# Patient Record
Sex: Male | Born: 1970 | Race: White | Hispanic: No | Marital: Single | Smoking: Former smoker
Health system: Southern US, Community
[De-identification: ages and names within clinical notes are randomized; demographics above are authoritative.]

## PROBLEM LIST (undated history)

## (undated) DIAGNOSIS — F912 Conduct disorder, adolescent-onset type: Secondary | ICD-10-CM

## (undated) HISTORY — PX: ABDOMINAL SURGERY: SHX537

---

## 2002-02-02 ENCOUNTER — Encounter: Payer: Self-pay | Admitting: Orthopedic Surgery

## 2002-02-02 ENCOUNTER — Inpatient Hospital Stay (HOSPITAL_COMMUNITY): Admission: EM | Admit: 2002-02-02 | Discharge: 2002-02-04 | Payer: Self-pay | Admitting: Emergency Medicine

## 2012-05-12 ENCOUNTER — Emergency Department (HOSPITAL_COMMUNITY): Payer: No Typology Code available for payment source

## 2012-05-12 ENCOUNTER — Emergency Department (HOSPITAL_COMMUNITY)
Admission: EM | Admit: 2012-05-12 | Discharge: 2012-05-12 | Disposition: A | Discharge status: Home / Self Care | Payer: No Typology Code available for payment source | Attending: Emergency Medicine | Admitting: Emergency Medicine

## 2012-05-12 DIAGNOSIS — S8000XA Contusion of unspecified knee, initial encounter: Secondary | ICD-10-CM | POA: Insufficient documentation

## 2012-05-12 DIAGNOSIS — R51 Headache: Secondary | ICD-10-CM | POA: Insufficient documentation

## 2012-05-12 DIAGNOSIS — Y92410 Unspecified street and highway as the place of occurrence of the external cause: Secondary | ICD-10-CM | POA: Insufficient documentation

## 2012-05-12 DIAGNOSIS — M542 Cervicalgia: Secondary | ICD-10-CM | POA: Insufficient documentation

## 2012-05-12 MED ORDER — IBUPROFEN 800 MG PO TABS: 800.0000 mg | ORAL_TABLET | Freq: Three times a day (TID) | ORAL | Status: DC

## 2012-05-12 MED ORDER — IBUPROFEN 800 MG PO TABS
800.0000 mg | ORAL_TABLET | Freq: Once | ORAL | Status: AC
  Administered 2012-05-12: 800 mg via ORAL
  Filled 2012-05-12: qty 1
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
## 2012-05-12 NOTE — ED Notes (Signed)
Staff ambulated with pt around the nurses station, pt sts no dizziness, "just a little stiff in the neck area."
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
## 2012-05-12 NOTE — ED Notes (Signed)
Patient has been removed from back board.  He only reports pain in his head and neck. He is sitting up in bed with C Collar in place with only complaint of a head ache

## 2015-04-01 ENCOUNTER — Encounter (HOSPITAL_COMMUNITY): Payer: Self-pay

## 2015-04-01 ENCOUNTER — Emergency Department (HOSPITAL_COMMUNITY): Payer: No Typology Code available for payment source

## 2015-04-01 ENCOUNTER — Emergency Department (HOSPITAL_COMMUNITY)
Admission: EM | Admit: 2015-04-01 | Discharge: 2015-04-02 | Disposition: A | Discharge status: Home / Self Care | Payer: No Typology Code available for payment source | Attending: Emergency Medicine | Admitting: Emergency Medicine

## 2015-04-01 DIAGNOSIS — S4991XA Unspecified injury of right shoulder and upper arm, initial encounter: Secondary | ICD-10-CM | POA: Insufficient documentation

## 2015-04-01 DIAGNOSIS — Z8659 Personal history of other mental and behavioral disorders: Secondary | ICD-10-CM | POA: Insufficient documentation

## 2015-04-01 DIAGNOSIS — Y92410 Unspecified street and highway as the place of occurrence of the external cause: Secondary | ICD-10-CM | POA: Diagnosis not present

## 2015-04-01 DIAGNOSIS — R109 Unspecified abdominal pain: Secondary | ICD-10-CM

## 2015-04-01 DIAGNOSIS — S3991XA Unspecified injury of abdomen, initial encounter: Secondary | ICD-10-CM | POA: Insufficient documentation

## 2015-04-01 DIAGNOSIS — Y998 Other external cause status: Secondary | ICD-10-CM | POA: Insufficient documentation

## 2015-04-01 DIAGNOSIS — Z791 Long term (current) use of non-steroidal anti-inflammatories (NSAID): Secondary | ICD-10-CM | POA: Diagnosis not present

## 2015-04-01 DIAGNOSIS — Y9389 Activity, other specified: Secondary | ICD-10-CM | POA: Diagnosis not present

## 2015-04-01 DIAGNOSIS — S0990XA Unspecified injury of head, initial encounter: Secondary | ICD-10-CM | POA: Diagnosis not present

## 2015-04-01 HISTORY — DX: Conduct disorder, adolescent-onset type: F91.2

## 2015-04-01 MED ORDER — IOHEXOL 300 MG/ML  SOLN: 100.0000 mL | Freq: Once | INTRAMUSCULAR | Status: AC | PRN

## 2015-04-01 MED ORDER — SODIUM CHLORIDE 0.9 % IV BOLUS (SEPSIS)
1000.0000 mL | Freq: Once | INTRAVENOUS | Status: AC
  Administered 2015-04-02: 1000 mL via INTRAVENOUS
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
## 2015-04-02 ENCOUNTER — Emergency Department (HOSPITAL_COMMUNITY): Payer: No Typology Code available for payment source

## 2015-04-02 LAB — CBC
HEMATOCRIT: 42.4 % (ref 39.0–52.0)
HEMOGLOBIN: 15 g/dL (ref 13.0–17.0)
MCH: 32.5 pg (ref 26.0–34.0)
MCHC: 35.4 g/dL (ref 30.0–36.0)
MCV: 91.8 fL (ref 78.0–100.0)
PLATELETS: 201 10*3/uL (ref 150–400)
RBC: 4.62 MIL/uL (ref 4.22–5.81)
RDW: 12.8 % (ref 11.5–15.5)
WBC: 10.3 10*3/uL (ref 4.0–10.5)

## 2015-04-02 LAB — BASIC METABOLIC PANEL
ANION GAP: 12 (ref 5–15)
BUN: 8 mg/dL (ref 6–20)
CALCIUM: 8.7 mg/dL — AB (ref 8.9–10.3)
CO2: 24 mmol/L (ref 22–32)
Chloride: 104 mmol/L (ref 101–111)
Creatinine, Ser: 1.09 mg/dL (ref 0.61–1.24)
GFR calc non Af Amer: >60 mL/min (ref >60)
GLUCOSE: 93 mg/dL (ref 65–99)
Potassium: 3.6 mmol/L (ref 3.5–5.1)
SODIUM: 140 mmol/L (ref 135–145)

## 2015-04-02 LAB — ETHANOL: ALCOHOL ETHYL (B): 176 mg/dL — AB (ref <5)

## 2015-04-02 MED ORDER — IOHEXOL 300 MG/ML  SOLN
75.0000 mL | Freq: Once | INTRAMUSCULAR | Status: AC | PRN
  Administered 2015-04-02: 75 mL via INTRAVENOUS

## 2015-04-02 NOTE — ED Notes (Signed)
Pt verbalizes understanding of d/c instructions and denies any further needs at this time. 
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
## 2015-04-02 NOTE — ED Notes (Signed)
Pt returned from CT °

## 2015-04-02 NOTE — ED Notes (Signed)
Pt returned from xray

## 2015-04-02 NOTE — ED Notes (Signed)
Patient transported to X-ray and CT 

## 2020-08-09 ENCOUNTER — Inpatient Hospital Stay (HOSPITAL_COMMUNITY): Payer: Self-pay

## 2020-08-09 ENCOUNTER — Emergency Department (HOSPITAL_COMMUNITY): Payer: Self-pay

## 2020-08-09 ENCOUNTER — Inpatient Hospital Stay (HOSPITAL_COMMUNITY)
Admission: EM | Admit: 2020-08-09 | Discharge: 2020-09-25 | DRG: 003 | Disposition: E | Payer: Self-pay | Attending: Pulmonary Disease | Admitting: Pulmonary Disease

## 2020-08-09 DIAGNOSIS — J9690 Respiratory failure, unspecified, unspecified whether with hypoxia or hypercapnia: Principal | ICD-10-CM

## 2020-08-09 DIAGNOSIS — R069 Unspecified abnormalities of breathing: Secondary | ICD-10-CM

## 2020-08-09 DIAGNOSIS — Z978 Presence of other specified devices: Secondary | ICD-10-CM

## 2020-08-09 DIAGNOSIS — N179 Acute kidney failure, unspecified: Secondary | ICD-10-CM

## 2020-08-09 DIAGNOSIS — Z4659 Encounter for fitting and adjustment of other gastrointestinal appliance and device: Secondary | ICD-10-CM

## 2020-08-09 DIAGNOSIS — I469 Cardiac arrest, cause unspecified: Secondary | ICD-10-CM | POA: Diagnosis present

## 2020-08-09 DIAGNOSIS — Z452 Encounter for adjustment and management of vascular access device: Secondary | ICD-10-CM

## 2020-08-09 DIAGNOSIS — J9600 Acute respiratory failure, unspecified whether with hypoxia or hypercapnia: Secondary | ICD-10-CM

## 2020-08-09 DIAGNOSIS — J9621 Acute and chronic respiratory failure with hypoxia: Secondary | ICD-10-CM

## 2020-08-09 DIAGNOSIS — Z0189 Encounter for other specified special examinations: Secondary | ICD-10-CM

## 2020-08-09 DIAGNOSIS — J9601 Acute respiratory failure with hypoxia: Secondary | ICD-10-CM

## 2020-08-09 DIAGNOSIS — R0902 Hypoxemia: Secondary | ICD-10-CM

## 2020-08-09 DIAGNOSIS — J80 Acute respiratory distress syndrome: Secondary | ICD-10-CM

## 2020-08-09 DIAGNOSIS — J9622 Acute and chronic respiratory failure with hypercapnia: Secondary | ICD-10-CM

## 2020-08-09 DIAGNOSIS — Y832 Surgical operation with anastomosis, bypass or graft as the cause of abnormal reaction of the patient, or of later complication, without mention of misadventure at the time of the procedure: Secondary | ICD-10-CM | POA: Diagnosis not present

## 2020-08-09 DIAGNOSIS — G935 Compression of brain: Secondary | ICD-10-CM | POA: Diagnosis not present

## 2020-08-09 DIAGNOSIS — Y990 Civilian activity done for income or pay: Secondary | ICD-10-CM

## 2020-08-09 DIAGNOSIS — I468 Cardiac arrest due to other underlying condition: Secondary | ICD-10-CM | POA: Diagnosis present

## 2020-08-09 DIAGNOSIS — R7303 Prediabetes: Secondary | ICD-10-CM | POA: Diagnosis present

## 2020-08-09 DIAGNOSIS — I609 Nontraumatic subarachnoid hemorrhage, unspecified: Secondary | ICD-10-CM | POA: Diagnosis not present

## 2020-08-09 DIAGNOSIS — I472 Ventricular tachycardia: Secondary | ICD-10-CM | POA: Diagnosis not present

## 2020-08-09 DIAGNOSIS — E781 Pure hyperglyceridemia: Secondary | ICD-10-CM | POA: Diagnosis present

## 2020-08-09 DIAGNOSIS — E872 Acidosis: Secondary | ICD-10-CM | POA: Diagnosis present

## 2020-08-09 DIAGNOSIS — I2129 ST elevation (STEMI) myocardial infarction involving other sites: Secondary | ICD-10-CM | POA: Diagnosis not present

## 2020-08-09 DIAGNOSIS — N170 Acute kidney failure with tubular necrosis: Secondary | ICD-10-CM | POA: Diagnosis present

## 2020-08-09 DIAGNOSIS — I615 Nontraumatic intracerebral hemorrhage, intraventricular: Secondary | ICD-10-CM | POA: Diagnosis not present

## 2020-08-09 DIAGNOSIS — R579 Shock, unspecified: Secondary | ICD-10-CM | POA: Insufficient documentation

## 2020-08-09 DIAGNOSIS — R739 Hyperglycemia, unspecified: Secondary | ICD-10-CM | POA: Diagnosis present

## 2020-08-09 DIAGNOSIS — Z66 Do not resuscitate: Secondary | ICD-10-CM | POA: Diagnosis not present

## 2020-08-09 DIAGNOSIS — I129 Hypertensive chronic kidney disease with stage 1 through stage 4 chronic kidney disease, or unspecified chronic kidney disease: Secondary | ICD-10-CM | POA: Diagnosis present

## 2020-08-09 DIAGNOSIS — J690 Pneumonitis due to inhalation of food and vomit: Secondary | ICD-10-CM | POA: Diagnosis present

## 2020-08-09 DIAGNOSIS — Z515 Encounter for palliative care: Secondary | ICD-10-CM

## 2020-08-09 DIAGNOSIS — A419 Sepsis, unspecified organism: Secondary | ICD-10-CM | POA: Diagnosis not present

## 2020-08-09 DIAGNOSIS — Y848 Other medical procedures as the cause of abnormal reaction of the patient, or of later complication, without mention of misadventure at the time of the procedure: Secondary | ICD-10-CM | POA: Diagnosis not present

## 2020-08-09 DIAGNOSIS — N189 Chronic kidney disease, unspecified: Secondary | ICD-10-CM | POA: Diagnosis present

## 2020-08-09 DIAGNOSIS — D6959 Other secondary thrombocytopenia: Secondary | ICD-10-CM | POA: Diagnosis present

## 2020-08-09 DIAGNOSIS — G931 Anoxic brain damage, not elsewhere classified: Secondary | ICD-10-CM | POA: Diagnosis present

## 2020-08-09 DIAGNOSIS — R34 Anuria and oliguria: Secondary | ICD-10-CM | POA: Diagnosis present

## 2020-08-09 DIAGNOSIS — I4510 Unspecified right bundle-branch block: Secondary | ICD-10-CM | POA: Diagnosis not present

## 2020-08-09 DIAGNOSIS — G253 Myoclonus: Secondary | ICD-10-CM | POA: Diagnosis present

## 2020-08-09 DIAGNOSIS — R578 Other shock: Secondary | ICD-10-CM | POA: Diagnosis not present

## 2020-08-09 DIAGNOSIS — E876 Hypokalemia: Secondary | ICD-10-CM | POA: Diagnosis not present

## 2020-08-09 DIAGNOSIS — T508X5A Adverse effect of diagnostic agents, initial encounter: Secondary | ICD-10-CM | POA: Diagnosis not present

## 2020-08-09 DIAGNOSIS — X500XXA Overexertion from strenuous movement or load, initial encounter: Secondary | ICD-10-CM

## 2020-08-09 DIAGNOSIS — E875 Hyperkalemia: Secondary | ICD-10-CM | POA: Diagnosis present

## 2020-08-09 DIAGNOSIS — G9382 Brain death: Secondary | ICD-10-CM | POA: Diagnosis not present

## 2020-08-09 DIAGNOSIS — Z6841 Body Mass Index (BMI) 40.0 and over, adult: Secondary | ICD-10-CM

## 2020-08-09 DIAGNOSIS — G9341 Metabolic encephalopathy: Secondary | ICD-10-CM | POA: Diagnosis present

## 2020-08-09 DIAGNOSIS — K219 Gastro-esophageal reflux disease without esophagitis: Secondary | ICD-10-CM | POA: Diagnosis present

## 2020-08-09 DIAGNOSIS — Z20822 Contact with and (suspected) exposure to covid-19: Secondary | ICD-10-CM | POA: Diagnosis present

## 2020-08-09 DIAGNOSIS — I4891 Unspecified atrial fibrillation: Secondary | ICD-10-CM | POA: Diagnosis present

## 2020-08-09 DIAGNOSIS — D6489 Other specified anemias: Secondary | ICD-10-CM | POA: Diagnosis present

## 2020-08-09 DIAGNOSIS — J9501 Hemorrhage from tracheostomy stoma: Secondary | ICD-10-CM | POA: Diagnosis not present

## 2020-08-09 DIAGNOSIS — Z87891 Personal history of nicotine dependence: Secondary | ICD-10-CM

## 2020-08-09 DIAGNOSIS — F4310 Post-traumatic stress disorder, unspecified: Secondary | ICD-10-CM | POA: Diagnosis present

## 2020-08-09 DIAGNOSIS — N141 Nephropathy induced by other drugs, medicaments and biological substances: Secondary | ICD-10-CM | POA: Diagnosis not present

## 2020-08-09 DIAGNOSIS — T82524A Displacement of infusion catheter, initial encounter: Secondary | ICD-10-CM | POA: Diagnosis not present

## 2020-08-09 LAB — URINALYSIS, ROUTINE W REFLEX MICROSCOPIC
Bilirubin Urine: NEGATIVE
Glucose, UA: NEGATIVE mg/dL
Ketones, ur: NEGATIVE mg/dL
Nitrite: NEGATIVE
Protein, ur: 30 mg/dL — AB
Specific Gravity, Urine: 1.014 (ref 1.005–1.030)
pH: 5 (ref 5.0–8.0)

## 2020-08-09 LAB — I-STAT CHEM 8, ED
BUN: 16 mg/dL (ref 6–20)
Calcium, Ion: 1.1 mmol/L — ABNORMAL LOW (ref 1.15–1.40)
Chloride: 104 mmol/L (ref 98–111)
Creatinine, Ser: 1.8 mg/dL — ABNORMAL HIGH (ref 0.61–1.24)
Glucose, Bld: 299 mg/dL — ABNORMAL HIGH (ref 70–99)
HCT: 49 % (ref 39.0–52.0)
Hemoglobin: 16.7 g/dL (ref 13.0–17.0)
Potassium: 4.1 mmol/L (ref 3.5–5.1)
Sodium: 139 mmol/L (ref 135–145)
TCO2: 24 mmol/L (ref 22–32)

## 2020-08-09 LAB — COMPREHENSIVE METABOLIC PANEL
ALT: 55 U/L — ABNORMAL HIGH (ref 0–44)
AST: 104 U/L — ABNORMAL HIGH (ref 15–41)
Albumin: 3.2 g/dL — ABNORMAL LOW (ref 3.5–5.0)
Alkaline Phosphatase: 83 U/L (ref 38–126)
Anion gap: 16 — ABNORMAL HIGH (ref 5–15)
BUN: 11 mg/dL (ref 6–20)
CO2: 22 mmol/L (ref 22–32)
Calcium: 8.7 mg/dL — ABNORMAL LOW (ref 8.9–10.3)
Chloride: 102 mmol/L (ref 98–111)
Creatinine, Ser: 1.79 mg/dL — ABNORMAL HIGH (ref 0.61–1.24)
GFR, Estimated: 46 mL/min — ABNORMAL LOW (ref >60)
Glucose, Bld: 310 mg/dL — ABNORMAL HIGH (ref 70–99)
Potassium: 4 mmol/L (ref 3.5–5.1)
Sodium: 140 mmol/L (ref 135–145)
Total Bilirubin: 0.9 mg/dL (ref 0.3–1.2)
Total Protein: 6.4 g/dL — ABNORMAL LOW (ref 6.5–8.1)

## 2020-08-09 LAB — CBC WITH DIFFERENTIAL/PLATELET
Abs Immature Granulocytes: 0 10*3/uL (ref 0.00–0.07)
Basophils Absolute: 0 10*3/uL (ref 0.0–0.1)
Basophils Relative: 0 %
Eosinophils Absolute: 0.5 10*3/uL (ref 0.0–0.5)
Eosinophils Relative: 3 %
HCT: 50.2 % (ref 39.0–52.0)
Hemoglobin: 16.7 g/dL (ref 13.0–17.0)
Lymphocytes Relative: 22 %
Lymphs Abs: 3.7 10*3/uL (ref 0.7–4.0)
MCH: 33.8 pg (ref 26.0–34.0)
MCHC: 33.3 g/dL (ref 30.0–36.0)
MCV: 101.6 fL — ABNORMAL HIGH (ref 80.0–100.0)
Monocytes Absolute: 1.2 10*3/uL — ABNORMAL HIGH (ref 0.1–1.0)
Monocytes Relative: 7 %
Neutro Abs: 11.6 10*3/uL — ABNORMAL HIGH (ref 1.7–7.7)
Neutrophils Relative %: 68 %
Platelets: 289 10*3/uL (ref 150–400)
RBC: 4.94 MIL/uL (ref 4.22–5.81)
RDW: 12.4 % (ref 11.5–15.5)
WBC: 17 10*3/uL — ABNORMAL HIGH (ref 4.0–10.5)
nRBC: 0 /100 WBC
nRBC: 0.2 % (ref 0.0–0.2)

## 2020-08-09 LAB — RAPID URINE DRUG SCREEN, HOSP PERFORMED
Amphetamines: NOT DETECTED
Barbiturates: NOT DETECTED
Benzodiazepines: NOT DETECTED
Cocaine: NOT DETECTED
Opiates: NOT DETECTED
Tetrahydrocannabinol: NOT DETECTED

## 2020-08-09 LAB — HIV ANTIBODY (ROUTINE TESTING W REFLEX): HIV Screen 4th Generation wRfx: NONREACTIVE

## 2020-08-09 LAB — LACTIC ACID, PLASMA
Lactic Acid, Venous: 6.8 mmol/L (ref 0.5–1.9)
Lactic Acid, Venous: 4.1 mmol/L (ref 0.5–1.9)

## 2020-08-09 LAB — RESP PANEL BY RT-PCR (FLU A&B, COVID) ARPGX2
Influenza A by PCR: NEGATIVE
Influenza B by PCR: NEGATIVE
SARS Coronavirus 2 by RT PCR: NEGATIVE

## 2020-08-09 LAB — LDL CHOLESTEROL, DIRECT: Direct LDL: 154.4 mg/dL — ABNORMAL HIGH (ref 0–99)

## 2020-08-09 LAB — TROPONIN I (HIGH SENSITIVITY)
Troponin I (High Sensitivity): 28 ng/L — ABNORMAL HIGH (ref <18)
Troponin I (High Sensitivity): 86 ng/L — ABNORMAL HIGH (ref <18)

## 2020-08-09 LAB — I-STAT ARTERIAL BLOOD GAS, ED
Acid-base deficit: 8 mmol/L — ABNORMAL HIGH (ref 0.0–2.0)
Bicarbonate: 23.6 mmol/L (ref 20.0–28.0)
Calcium, Ion: 1.23 mmol/L (ref 1.15–1.40)
HCT: 49 % (ref 39.0–52.0)
Hemoglobin: 16.7 g/dL (ref 13.0–17.0)
O2 Saturation: 74 %
Patient temperature: 100.4
Potassium: 4.1 mmol/L (ref 3.5–5.1)
Sodium: 139 mmol/L (ref 135–145)
TCO2: 26 mmol/L (ref 22–32)
pCO2 arterial: 75.4 mmHg (ref 32.0–48.0)
pH, Arterial: 7.11 — CL (ref 7.350–7.450)
pO2, Arterial: 57 mmHg — ABNORMAL LOW (ref 83.0–108.0)

## 2020-08-09 LAB — LIPID PANEL
Cholesterol: 268 mg/dL — ABNORMAL HIGH (ref 0–200)
HDL: 31 mg/dL — ABNORMAL LOW (ref >40)
LDL Cholesterol: UNDETERMINED mg/dL (ref 0–99)
Total CHOL/HDL Ratio: 8.6 RATIO
Triglycerides: 519 mg/dL — ABNORMAL HIGH (ref <150)
VLDL: UNDETERMINED mg/dL (ref 0–40)

## 2020-08-09 LAB — HEMOGLOBIN A1C
Hgb A1c MFr Bld: 5.9 % — ABNORMAL HIGH (ref 4.8–5.6)
Mean Plasma Glucose: 122.63 mg/dL

## 2020-08-09 LAB — APTT: aPTT: 20 seconds — ABNORMAL LOW (ref 24–36)

## 2020-08-09 LAB — MAGNESIUM: Magnesium: 2.2 mg/dL (ref 1.7–2.4)

## 2020-08-09 LAB — TYPE AND SCREEN
ABO/RH(D): A POS
Antibody Screen: NEGATIVE

## 2020-08-09 LAB — PROTIME-INR
INR: 1.1 (ref 0.8–1.2)
Prothrombin Time: 14.2 seconds (ref 11.4–15.2)

## 2020-08-09 LAB — ETHANOL: Alcohol, Ethyl (B): 55 mg/dL — ABNORMAL HIGH (ref <10)

## 2020-08-09 MED ORDER — FENTANYL 2500MCG IN NS 250ML (10MCG/ML) PREMIX INFUSION
50.0000 ug/h | INTRAVENOUS | Status: DC
  Administered 2020-08-10 – 2020-08-12 (×5): 200 ug/h via INTRAVENOUS
  Administered 2020-08-13: 250 ug/h via INTRAVENOUS
  Administered 2020-08-13: 225 ug/h via INTRAVENOUS
  Administered 2020-08-14: 150 ug/h via INTRAVENOUS
  Administered 2020-08-14: 225 ug/h via INTRAVENOUS
  Administered 2020-08-15: 150 ug/h via INTRAVENOUS
  Administered 2020-08-15: 175 ug/h via INTRAVENOUS
  Administered 2020-08-16 – 2020-08-17 (×7): 400 ug/h via INTRAVENOUS
  Filled 2020-08-09 (×19): qty 250

## 2020-08-09 MED ORDER — EPINEPHRINE 1 MG/10ML IJ SOSY
PREFILLED_SYRINGE | INTRAMUSCULAR | Status: AC | PRN
Start: 2020-08-09 — End: 2020-08-09
  Administered 2020-08-09: 1 via INTRAVENOUS

## 2020-08-09 MED ORDER — DOCUSATE SODIUM 100 MG PO CAPS: 100.0000 mg | ORAL_CAPSULE | Freq: Two times a day (BID) | ORAL | Status: DC | PRN

## 2020-08-09 MED ORDER — SODIUM CHLORIDE 0.9 % IV SOLN: INTRAVENOUS | Status: DC

## 2020-08-09 MED ORDER — FENTANYL CITRATE (PF) 100 MCG/2ML IJ SOLN
INTRAMUSCULAR | Status: AC
  Filled 2020-08-09: qty 2

## 2020-08-09 MED ORDER — VANCOMYCIN HCL 2000 MG/400ML IV SOLN
2000.0000 mg | Freq: Once | INTRAVENOUS | Status: AC
  Administered 2020-08-09: 2000 mg via INTRAVENOUS
  Filled 2020-08-09: qty 400

## 2020-08-09 MED ORDER — FENTANYL CITRATE (PF) 100 MCG/2ML IJ SOLN
INTRAMUSCULAR | Status: AC | PRN
  Administered 2020-08-09: 100 ug via INTRAVENOUS

## 2020-08-09 MED ORDER — AMIODARONE HCL IN DEXTROSE 360-4.14 MG/200ML-% IV SOLN
60.0000 mg/h | INTRAVENOUS | Status: DC
  Administered 2020-08-09: 60 mg/h via INTRAVENOUS
  Filled 2020-08-09: qty 200

## 2020-08-09 MED ORDER — CALCIUM CHLORIDE 10 % IV SOLN
INTRAVENOUS | Status: AC | PRN
  Administered 2020-08-09: 1 g via INTRAVENOUS

## 2020-08-09 MED ORDER — PIPERACILLIN-TAZOBACTAM 3.375 G IVPB 30 MIN
3.3750 g | Freq: Once | INTRAVENOUS | Status: AC
  Administered 2020-08-09: 3.375 g via INTRAVENOUS
  Filled 2020-08-09: qty 50

## 2020-08-09 MED ORDER — FENTANYL BOLUS VIA INFUSION
50.0000 ug | INTRAVENOUS | Status: DC | PRN
  Filled 2020-08-09: qty 50

## 2020-08-09 MED ORDER — PROPOFOL 1000 MG/100ML IV EMUL
0.0000 ug/kg/min | INTRAVENOUS | Status: DC
  Administered 2020-08-09: 5 ug/kg/min via INTRAVENOUS
  Administered 2020-08-10: 15 ug/kg/min via INTRAVENOUS
  Administered 2020-08-10 (×3): 50 ug/kg/min via INTRAVENOUS
  Administered 2020-08-11: 30 ug/kg/min via INTRAVENOUS
  Administered 2020-08-11 (×2): 50 ug/kg/min via INTRAVENOUS
  Administered 2020-08-11: 20 ug/kg/min via INTRAVENOUS
  Administered 2020-08-11 – 2020-08-12 (×7): 50 ug/kg/min via INTRAVENOUS
  Filled 2020-08-09 (×2): qty 100
  Filled 2020-08-09: qty 200
  Filled 2020-08-09 (×3): qty 100
  Filled 2020-08-09: qty 200
  Filled 2020-08-09 (×10): qty 100

## 2020-08-09 MED ORDER — LACTATED RINGERS IV BOLUS
1000.0000 mL | Freq: Once | INTRAVENOUS | Status: AC
  Administered 2020-08-09: 1000 mL via INTRAVENOUS

## 2020-08-09 MED ORDER — FENTANYL CITRATE (PF) 100 MCG/2ML IJ SOLN
50.0000 ug | Freq: Once | INTRAMUSCULAR | Status: AC
Start: 2020-08-09 — End: 2020-08-09
  Administered 2020-08-09: 50 ug via INTRAVENOUS

## 2020-08-09 MED ORDER — AMIODARONE HCL IN DEXTROSE 360-4.14 MG/200ML-% IV SOLN
30.0000 mg/h | INTRAVENOUS | Status: DC
  Administered 2020-08-10: 30 mg/h via INTRAVENOUS

## 2020-08-09 MED ORDER — ACETAMINOPHEN 650 MG RE SUPP
650.0000 mg | RECTAL | Status: DC
  Administered 2020-08-10: 650 mg via RECTAL
  Filled 2020-08-09 (×2): qty 1

## 2020-08-09 MED ORDER — FENTANYL CITRATE (PF) 100 MCG/2ML IJ SOLN: 50.0000 ug | Freq: Once | INTRAMUSCULAR | Status: DC

## 2020-08-09 MED ORDER — NOREPINEPHRINE 4 MG/250ML-% IV SOLN
INTRAVENOUS | Status: AC | PRN
  Administered 2020-08-09: 20 ug/min via INTRAVENOUS

## 2020-08-09 MED ORDER — FENTANYL 2500MCG IN NS 250ML (10MCG/ML) PREMIX INFUSION
25.0000 ug/h | INTRAVENOUS | Status: DC
  Administered 2020-08-09: 100 ug/h via INTRAVENOUS
  Filled 2020-08-09: qty 250

## 2020-08-09 MED ORDER — ACETAMINOPHEN 160 MG/5ML PO SOLN
650.0000 mg | ORAL | Status: DC
  Administered 2020-08-10 – 2020-08-15 (×25): 650 mg
  Filled 2020-08-09 (×27): qty 20.3

## 2020-08-09 MED ORDER — POLYETHYLENE GLYCOL 3350 17 G PO PACK: 17.0000 g | PACK | Freq: Every day | ORAL | Status: DC | PRN

## 2020-08-09 MED ORDER — SODIUM BICARBONATE 8.4 % IV SOLN
INTRAVENOUS | Status: AC | PRN
Start: 2020-08-09 — End: 2020-08-09
  Administered 2020-08-09: 100 meq via INTRAVENOUS

## 2020-08-09 MED ORDER — AMIODARONE LOAD VIA INFUSION
150.0000 mg | Freq: Once | INTRAVENOUS | Status: AC
  Administered 2020-08-09: 150 mg via INTRAVENOUS

## 2020-08-09 MED ORDER — AMIODARONE HCL IN DEXTROSE 360-4.14 MG/200ML-% IV SOLN
INTRAVENOUS | Status: AC
  Administered 2020-08-09: 60 mg/h via INTRAVENOUS
  Filled 2020-08-09: qty 200

## 2020-08-09 MED ORDER — ACETAMINOPHEN 325 MG PO TABS
650.0000 mg | ORAL_TABLET | ORAL | Status: DC
  Administered 2020-08-11: 650 mg via ORAL

## 2020-08-09 MED ORDER — IOHEXOL 350 MG/ML SOLN
100.0000 mL | Freq: Once | INTRAVENOUS | Status: AC | PRN
  Administered 2020-08-09: 100 mL via INTRAVENOUS

## 2020-08-09 MED ORDER — EPINEPHRINE 0.1 MG/10ML (10 MCG/ML) SYRINGE FOR IV PUSH (FOR BLOOD PRESSURE SUPPORT)
PREFILLED_SYRINGE | INTRAVENOUS | Status: AC | PRN
  Administered 2020-08-09: 20 ug via INTRAVENOUS

## 2020-08-09 MED ORDER — DOCUSATE SODIUM 50 MG/5ML PO LIQD
100.0000 mg | Freq: Two times a day (BID) | ORAL | Status: DC
  Administered 2020-08-10: 100 mg
  Filled 2020-08-09 (×2): qty 10

## 2020-08-09 MED ORDER — SODIUM CHLORIDE 0.9 % IV SOLN
3.0000 g | Freq: Four times a day (QID) | INTRAVENOUS | Status: DC
  Administered 2020-08-10 (×2): 3 g via INTRAVENOUS
  Filled 2020-08-09 (×3): qty 8
  Filled 2020-08-09: qty 3

## 2020-08-09 MED ORDER — POLYETHYLENE GLYCOL 3350 17 G PO PACK
17.0000 g | PACK | Freq: Every day | ORAL | Status: DC
  Administered 2020-08-10 – 2020-08-14 (×5): 17 g
  Filled 2020-08-09 (×5): qty 1

## 2020-08-09 MED ORDER — PANTOPRAZOLE SODIUM 40 MG IV SOLR
40.0000 mg | Freq: Every day | INTRAVENOUS | Status: DC
  Administered 2020-08-09: 40 mg via INTRAVENOUS
  Filled 2020-08-09: qty 40

## 2020-08-09 NOTE — Code Documentation (Signed)
On arrival- pt was pulseless- being bagged through cricothyrotomy that was placed by EMS- pt color is cyanotic-  Regained pulses at 1724 --  Rhythm = A Fib, RVR on monitor Bloody frothy sputum from ETT- suctioned per RT

## 2020-08-09 NOTE — H&P (Addendum)
NAME:  Tyler [name], MRN:  [id number], DOB:  [birth year], LOS: 0 ADMISSION DATE:  08/04/2020, CONSULTATION DATE:  07/27/2020 REFERRING MD:  EDP, CHIEF COMPLAINT: Cardiac Arrest  Brief History:  49 year old male with history of GERD, PTSD, esophageal stricture and food impaction who presents with witnessed out of hospital PEA arrest that occurred while lifting at heavy object overhead at work.  Unknown total downtime prior to hospitalization, but patient was difficult to back and required emergent tracheostomy by EMS.  PCCM consulted for admission  History of Present Illness:  Tyler [name] is a 49 year old male with past medical history of obesity, GERD, PTSD, distal esophageal stenosis with history of food impaction and requiring dilation in 08/2018, per care everywhere records in duplicate chart who presents with witnessed, out of hospital PEA arrest.   Per report from family and EMS, patient was at work and was lifting something over his head when he got a blank stare on his face and then collapsed.  Bystander CPR was immediately started, when EMS arrived for around ACLS were required.  A King airway was placed, but was subsequently difficult to bag and an emergent cricothyrotomy was performed.  On arrival to [facility] he lost pulses again and required another 10 minutes of ACLS.  He has not been responsive.  EKG showed atrial fibrillation with RVR without signs of ischemia.  Patient's fianc and daughter at the bedside, they report that he had "not been feeling well" recently with some acid reflux and upper stomach/chest discomfort.  They deny any history of hypertension, cardiac disease, diabetes, recent illness or fever.   Labs in the ED were significant for an acute kidney injury with creatinine of 1.7, glucose greater than 300, lactic acid 6.8, Covid-19 and influenza negative.  Chest x-ray with extensive airspace infiltrates.  He required low-dose Levophed for hypotension and has been  unresponsive since arrival.  PCCM consulted for admission   Past Medical History:  GERD, esophageal stenosis, PTSD, obesity  Significant Hospital Events:  12/16 admit to critical care  Consults:    Procedures:  12/16 emergent trach by EMS 12/16 right femoral CVC in ED  Significant Diagnostic Tests:  12/16 CXR>>Extensive subcutaneous gas at the neck base and pneumomediastinum. This may relate to either emergent tracheostomy placement or perforation of the tracheobronchial tree secondary to orogastric tube malposition. Extensive bilateral airspace infiltrate, infection versus edema versus hemorrhage.   12/16 CT head>> 12/16 CT chest>>  Micro Data:  12/16 SARS-Covid-2, influenza>> 12/15 blood cultures x2>>  Antimicrobials:     Interim History / Subjective:  Patient started on amiodarone drip which improved heart rate and blood pressure.  Objective   Blood pressure 105/69, pulse 99, temperature (!) 100.4 F (38 C), resp. rate (!) 9, height 5\' 8"  (1.727 m), weight 125 kg, SpO2 (!) 72 %.    Vent Mode: PRVC FiO2 (%):  [100 %] 100 % Set Rate:  [18 bmp] 18 bmp Vt Set:  [550 mL] 550 mL PEEP:  [8 cmH20] 8 cmH20  No intake or output data in the 24 hours ending 08/14/2020 1944 Filed Weights   08/10/2020 1807  Weight: 125 kg    General: Obese male, unresponsive on full ventilatory support HEENT: MM pink/moist, emergent trach in place with minimal bleeding, pupils 2 mm and minimally responsive to light Neuro: Examined on fentanyl, unresponsive to pain, is triggering vent, pupils minimally responsive to light CV: s1s2 tachycardic and irregular, no m/r/g PULM: Mechanical breath sounds with poor air entry in  bilateral bases GI: soft, bsx4 active  Extremities: warm/dry, no edema  Skin: no rashes or lesions   Resolved Hospital Problem list     Assessment & Plan:   Witnessed, out of hospital PEA arrest with subsequent respiratory failure and emergent tracheostomy Unclear  downtime, but at least for rounds of ACLS by EMS and 10 minutes CPR in the ED with initial [facility] airway placed with subsequent resistance to bagging and emergent trach placed by EMS.   Per fianc, patient has a reported history of passing out but she cannot give further history.  Denies any known arrhythmia.   P: -Start amiodarone gtt. for atrial fibrillation with RVR, repeat EKG and cycle troponins -CT head and chest -Echocardiogram -EEG -Infiltrates versus pulmonary hemorrhage on CXR, start Unasyn for aspiration especially with history of esophageal stricture -[place] need ENT consult to evaluate emergent tracheostomy -Normothermia protocol with fentanyl and propofol for sedation --Maintain full vent support with SAT/SBT as tolerated -titrate Vent setting to maintain SpO2 greater than or equal to 90%. -HOB elevated 30 degrees. -Plateau pressures less than 30 cm H20.  -Follow chest x-ray, ABG prn.   -Bronchial hygiene and RT/bronchodilator protocol. -Continue Levophed to maintain MAP greater than 65   Acute encephalopathy Concern for hypoxic ischemic injury given prolonged ACLS. P: -CT head -Monitor for signs of neurologic recovery -EEG   Acute kidney injury Baseline creatinine~1 in care everywhere P: -Suspect ATN from prolonged hypotension, avoid nephrotoxins, bolus IV fluids and monitor renal indices and electrolytes along with urine output      Best practice (evaluated daily)  Diet: N.p.o. Pain/Anxiety/Delirium protocol (if indicated): Fentanyl propofol VAP protocol (if indicated): Head of bed 30 degrees, suction as needed DVT prophylaxis: SCDs pending head CT GI prophylaxis: Protonix Glucose control: Sliding scale Mobility: Bedrest Disposition: ICU  Goals of Care:  Last date of multidisciplinary goals of care discussion: Initial goals of care discussion on admission with patient's fianc and daughter, aggressive care and full code. Family and staff present: Nursing and  patient's fianc and daughter Summary of discussion: Full code Follow up goals of care discussion due: 12/23 Code Status: Full code  Labs   CBC: Recent Labs  Lab 09-04-20 1805 09/04/2020 1821  WBC 17.0*  --   NEUTROABS 11.6*  --   HGB 16.7 16.7  HCT 50.2 49.0  MCV 101.6*  --   PLT 289  --     Basic Metabolic Panel: Recent Labs  Lab 09-04-20 1805 04-Sep-2020 1821  NA 140 139  K 4.0 4.1  CL 102 104  CO2 22  --   GLUCOSE 310* 299*  BUN 11 16  CREATININE 1.79* 1.80*  CALCIUM 8.7*  --    GFR: Estimated Creatinine Clearance: 63.9 mL/min (A) (by C-G formula based on SCr of 1.8 mg/dL (H)). Recent Labs  Lab 09-04-2020 1805  WBC 17.0*  LATICACIDVEN 6.8*    Liver Function Tests: Recent Labs  Lab 09/04/20 1805  AST 104*  ALT 55*  ALKPHOS 83  BILITOT 0.9  PROT 6.4*  ALBUMIN 3.2*   No results for input(s): LIPASE, AMYLASE in the last 168 hours. No results for input(s): AMMONIA in the last 168 hours.  ABG    Component Value Date/Time   TCO2 24 09-04-2020 1821     Coagulation Profile: Recent Labs  Lab 2020/09/04 1805  INR 1.1    Cardiac Enzymes: No results for input(s): CKTOTAL, CKMB, CKMBINDEX, TROPONINI in the last 168 hours.  HbA1C: Hgb A1c MFr Bld  Date/Time Value Ref Range Status  07/29/2020 06:05 PM 5.9 (H) 4.8 - 5.6 % Final    Comment:    (NOTE) Pre diabetes:          5.7%-6.4%  Diabetes:              >6.4%  Glycemic control for   <7.0% adults with diabetes     CBG: No results for input(s): GLUCAP in the last 168 hours.  Review of Systems:   Pain  Past Medical History:  He,  has no past medical history on file.   Surgical History:    Social History:      Family History:  His family history is not on file.   Allergies Not on File   Home Medications  Prior to Admission medications   Not on File     Critical care time: 50 minutes      CRITICAL CARE Performed by: Darcella Gasman Kortez Murtagh   Total critical care time: 50  minutes  Critical care time was exclusive of separately billable procedures and treating other patients.  Critical care was necessary to treat or prevent imminent or life-threatening deterioration.  Critical care was time spent personally by me on the following activities: development of treatment plan with patient and/or surrogate as well as nursing, discussions with consultants, evaluation of patient's response to treatment, examination of patient, obtaining history from patient or surrogate, ordering and performing treatments and interventions, ordering and review of laboratory studies, ordering and review of radiographic studies, pulse oximetry and re-evaluation of patient's condition.  Darcella Gasman Antwine Agosto, PA-C [facility]  Pager# [phone], if no answer [phone]

## 2020-08-09 NOTE — Consult Note (Signed)
Responded to page from security, made initial contact with first 1 family member, daughter, then 2 others, daughter's brother and another male relative who said she was a Engineer, civil (consulting). Family had no information and was emotional.  Daughter wanted to know what happened, brother said they didn't need/want a chaplain or prayer--their own pastor was coming. He told his sister they only send a chaplain when they're going to die. She did not want to hear him, and desired me to find medical staff.   Later accompanied staff to inform family of pt's status. Another daughter and pt's stepmother had joined daughter and brother in room. After doctor's briefing, nurse said the two daughters could go back to see pt soon, and she would come for them. The first daughter believes her father Laster be able to feel her presence in the room and respond better.The stepmother asked for prayer, and invited the daughters to join. They declined. I explained how, when pt was moved to ICU as expected, they could go to that waiting area and talk to nurse there, then left the daughters in consult room to pray with stepmother in the hall. She asked if I'd say a prayer for pt in the room with him. I said I'd pray for him now, and, if they called me back after daughters had visited, would be glad to.  Rev. Donnel Saxon Chaplain

## 2020-08-09 NOTE — Code Documentation (Signed)
Family updated as to patient's status.

## 2020-08-09 NOTE — Progress Notes (Signed)
  Amiodarone Drug - Drug Interaction Consult Note  Recommendations: No medication adjustments at this time - continue to monitor vitals and for any future medication adjustments.   Amiodarone is metabolized by the cytochrome P450 system and therefore has the potential to cause many drug interactions. Amiodarone has an average plasma half-life of 50 days (range 20 to 100 days).   There is potential for drug interactions to occur several weeks or months after stopping treatment and the onset of drug interactions may be slow after initiating amiodarone.   []  Statins: Increased risk of myopathy. Simvastatin- restrict dose to 20mg  daily. Other statins: counsel patients to report any muscle pain or weakness immediately.  []  Anticoagulants: Amiodarone can increase anticoagulant effect. Consider warfarin dose reduction. Patients should be monitored closely and the dose of anticoagulant altered accordingly, remembering that amiodarone levels take several weeks to stabilize.  []  Antiepileptics: Amiodarone can increase plasma concentration of phenytoin, the dose should be reduced. Note that small changes in phenytoin dose can result in large changes in levels. Monitor patient and counsel on signs of toxicity.  []  Beta blockers: increased risk of bradycardia, AV block and myocardial depression. Sotalol - avoid concomitant use.  []   Calcium channel blockers (diltiazem and verapamil): increased risk of bradycardia, AV block and myocardial depression.  []   Cyclosporine: Amiodarone increases levels of cyclosporine. Reduced dose of cyclosporine is recommended.  []  Digoxin dose should be halved when amiodarone is started.  []  Diuretics: increased risk of cardiotoxicity if hypokalemia occurs.  []  Oral hypoglycemic agents (glyburide, glipizide, glimepiride): increased risk of hypoglycemia. Patient's glucose levels should be monitored closely when initiating amiodarone therapy.   []  Drugs that prolong the QT  interval:  Torsades de pointes risk may be increased with concurrent use - avoid if possible.  Monitor QTc, also keep magnesium/potassium WNL if concurrent therapy can't be avoided. Antibiotics: e.g. fluoroquinolones, erythromycin. . Antiarrhythmics: e.g. quinidine, procainamide, disopyramide, sotalol. . Antipsychotics: e.g. phenothiazines, haloperidol.  . Lithium, tricyclic antidepressants, and methadone.  Thank You,  , PharmD, BCCCP Clinical Pharmacist  Phone: [phone] 07/31/2020 7:53 PM  Please check AMION for all [facility] phone numbers After 10:00 PM, call Main Pharmacy [phone]

## 2020-08-09 NOTE — Code Documentation (Signed)
Family updated as to patient's status. Daughter and girlfriend to bedside

## 2020-08-09 NOTE — ED Provider Notes (Signed)
[name] EMERGENCY DEPARTMENT Provider Note   CSN: [id number] Arrival date & time: 08/10/2020  1725     History Chief Complaint  Patient presents with  . CPR   Burwell B Beagley is a 49 y.o. male.  HPI Pt is a 49 y/o male with unknown prior medical history who presents to ED via EMS status post arrest. 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 He collapsed and coworkers were able to help lower him to the ground. FD arrived, found him to be pulseless, so started CPR. EMS then arrived at the scene and states pt achieved ROSC after 4 rounds of CPR. King airway placed. As pt being transported to ED, noted to have bradycardia and resistance with bagging. Pt hypertensive en route. Due to concerns for upper airway obstruction with difficult BVM, cricothyrotomy performed by EMS and pt satting in mid-90s upon arrival. Upon arrival in the dock, pt coded again and CPR initiated. Pt is unresponsive on arrival.   No past medical history on file.  There are no problems to display for this patient.   The histories are not reviewed yet. Please review them in the "History" navigator section and refresh this SmartLink.     No family history on file.     Home Medications Prior to Admission medications   Not on File    Allergies    Patient has no allergy information on record.  Review of Systems   Review of Systems  Unable to perform ROS: Patient unresponsive    Physical Exam Updated Vital Signs Ht 5\' 8"  (1.727 m)   Wt 125 kg   BMI 41.90 kg/m   Physical Exam Vitals reviewed.  Constitutional:      Appearance: He is well-developed and well-nourished. He is obese. He is ill-appearing.     Comments: Unresponsive. Face is mottled and purple. C-collar and tracheal tube in place through tracheostomy  HENT:     Head: Normocephalic and atraumatic.     Right Ear: External ear  normal.     Left Ear: External ear normal.     Nose: Nose normal.  Eyes:     General: No scleral icterus. Neck:     Comments: Cervical collar in place Cardiovascular:     Comments: CPR in progress Pulmonary:     Comments: Bilateral breath sounds present with BVM ventilation on arrival Abdominal:     Palpations: Abdomen is soft.     Comments: Protuberant  Musculoskeletal:        General: No edema.     Right lower leg: No edema.     Left lower leg: No edema.  Skin:    General: Skin is warm and dry.     Coloration: Skin is pale.  Neurological:     Comments: Unresponsive  Psychiatric:        Mood and Affect: Mood and affect normal.     ED Results / Procedures / Treatments   Labs (all labs ordered are listed, but only abnormal results are displayed) Labs Reviewed  HEMOGLOBIN A1C - Abnormal; Notable for the following components:      Result Value   Hgb A1c MFr Bld 5.9 (*)    All other components within normal limits  CBC WITH DIFFERENTIAL/PLATELET - Abnormal; Notable for the following components:   WBC 17.0 (*)    MCV 101.6 (*)  All other components within normal limits  APTT - Abnormal; Notable for the following components:   aPTT 20 (*)    All other components within normal limits  I-STAT CHEM 8, ED - Abnormal; Notable for the following components:   Creatinine, Ser 1.80 (*)    Glucose, Bld 299 (*)    Calcium, Ion 1.10 (*)    All other components within normal limits  RESP PANEL BY RT-PCR (FLU A&B, COVID) ARPGX2  CULTURE, BLOOD (ROUTINE X 2)  CULTURE, BLOOD (ROUTINE X 2)  URINE CULTURE  PROTIME-INR  COMPREHENSIVE METABOLIC PANEL  LIPID PANEL  URINALYSIS, ROUTINE W REFLEX MICROSCOPIC  RAPID URINE DRUG SCREEN, HOSP PERFORMED  LACTIC ACID, PLASMA  LACTIC ACID, PLASMA  ETHANOL  TYPE AND SCREEN  ABO/RH  TROPONIN I (HIGH SENSITIVITY)   EKG EKG Interpretation  Date/Time:  Thursday August 09 2020 17:45:16 EST Ventricular Rate:  164 PR Interval:    QRS  Duration: 96 QT Interval:  265 QTC Calculation: 450 R Axis:   47 Text Interpretation: Atrial fibrillation with rapid V-rate Repolarization abnormality, prob rate related Since last tracing rate faster Confirmed by Melene Plan [phone]) on 08/06/2020 6:36:22 PM   Radiology No results found.  Procedures .Central Line  Date/Time: 08/08/2020 6:30 PM Performed by: Corliss Blacker, MD Authorized by: Melene Plan, DO   Consent:    Consent obtained:  Emergent situation   Risks, benefits, and alternatives were discussed: not applicable   Universal protocol:    Patient identity confirmed:  Arm band Pre-procedure details:    Indication(s): central venous access     Hand hygiene: Hand hygiene performed prior to insertion     Skin preparation:  Chlorhexidine   Skin preparation agent: Skin preparation agent completely dried prior to procedure   Procedure details:    Location:  R femoral   Patient position:  Supine   Procedural supplies:  Triple lumen   Landmarks identified: yes     Ultrasound guidance: yes     Ultrasound guidance timing: prior to insertion and real time     Sterile ultrasound techniques: Sterile gel and sterile probe covers were used     Number of attempts:  2   Successful placement: yes   Post-procedure details:    Post-procedure:  Dressing applied and line sutured   Assessment:  Blood return through all ports and free fluid flow   Procedure completion:  Tolerated well, no immediate complications ARTERIAL LINE  Date/Time: 08/01/2020 6:45 PM Performed by: Corliss Blacker, MD Authorized by: Melene Plan, DO   Consent:    Consent obtained:  Emergent situation   Risks, benefits, and alternatives were discussed: not applicable   Indications:    Indications: hemodynamic monitoring   Pre-procedure details:    Skin preparation:  Chlorhexidine   Preparation: Patient was prepped and draped in sterile fashion   Anesthesia:    Anesthesia method:  None Procedure details:     Location:  R femoral   Placement technique:  Seldinger and ultrasound guided   Number of attempts:  2   Transducer: waveform confirmed   Post-procedure details:    Post-procedure:  Biopatch applied, sutured and sterile dressing applied   Procedure completion:  Tolerated   (including critical care time)  Medications Ordered in ED Medications  0.9 %  sodium chloride infusion (has no administration in time range)  vancomycin (VANCOREADY) IVPB 2000 mg/400 mL (has no administration in time range)  piperacillin-tazobactam (ZOSYN) IVPB 3.375 g (has no administration in time range)  fentaNYL (SUBLIMAZE) injection 50 mcg (has no administration in time range)  fentaNYL in NS (51mcg/ml) infusion-PREMIX (has no administration in time range)  fentaNYL (SUBLIMAZE) bolus via infusion 50 mcg (has no administration in time range)  fentaNYL (SUBLIMAZE) 100 MCG/2ML injection (has no administration in time range)  EPINEPHrine (ADRENALIN) 1 MG/10ML injection (1 Syringe Intravenous Given 2020-08-28 1717)    ED Course  I have reviewed the triage vital signs and the nursing notes.  Pertinent labs & imaging results that were available during my care of the patient were reviewed by me and considered in my medical decision making (see chart for details).    MDM Rules/Calculators/A&P                          49 y/o male status post PEA arrest. Differential includes cardiorespiratory arrest secondary to aspiration, pulmonary embolism, MI, pneumothorax, aortic dissection. Pt remains unresponsive on arrival.  CPR continued upon arrival. Pt received epinephrine x 2, bicarbonate, and calcium chloride. During pulse check, pt in PEA with bradycardic rhythm on monitor. After approximately 10 minutes CPR, pt found to have ROSC, with gradually developing tachycardia. Noted to be in atrial fibrillation with RVR.  Labs: reviewed and significant for COVID-19 negative. EtOH 55. Troponin 28. Elevated creatinine  (1.79) with mildly elevated LFTs and anion gap. Leukocytosis (17). U/A with elevated protein, WBC, and few bacteria. Lactic acid elevated as expected. VBG pH 7.110, PCO2 75.4. Imaging: reviewed and significant for CXR with diffuse edema concerning for flash pulmonary edema vs ARDS. CTA chest and ordered and pending. CT head ordered and pending.  Pt's tracheal tube appears to be functioning relatively well, with initially encouraging saturations and bilateral breath sounds. Progressively worsening SpO2 with evidence of pulmonary edema on CXR. Pt's symptoms felt less likely secondary to ACS in context of relatively low troponin. Also has developed hypotension requiring levophed for support.  Pt Ebony be admitted to the ICU under Critical Care service.  Final Clinical Impression(s) / ED Diagnoses Final diagnoses:  Cardiac arrest      Corliss Blacker, MD 08/10/20 [id number]    Melene Plan, DO 08/10/20 3428    Melene Plan, DO 08/10/20 [phone]

## 2020-08-09 NOTE — ED Triage Notes (Signed)
To ED via [facility] from his work - witnessed cardiac arrest- was lifting something over his head and got a blank far away look on his face and passed out - co workers started CPR.

## 2020-08-09 NOTE — ED Notes (Signed)
OG pulled per xray results

## 2020-08-09 NOTE — ED Provider Notes (Signed)
OG placement  Date/Time: 08/21/2020 11:40 PM Performed by: Melene Plan, DO Authorized by: Melene Plan, DO  Consent: The procedure was performed in an emergent situation. Verbal consent not obtained. Risks and benefits: risks, benefits and alternatives were discussed Imaging studies: imaging studies available Required items: required blood products, implants, devices, and special equipment available Time out: Immediately prior to procedure a "time out" was called to verify the correct patient, procedure, equipment, support staff and site/side marked as required. Preparation: Patient was prepped and draped in the usual sterile fashion. Local anesthesia used: no  Anesthesia: Local anesthesia used: no  Sedation: Patient sedated: no  Patient tolerance: patient tolerated the procedure well with no immediate complications Comments: OG placed with + air in the stomach and gastric secretions on suctioning.  Removed for NG tube to secure more easily       Melene Plan, DO 08/03/2020 2341

## 2020-08-10 ENCOUNTER — Inpatient Hospital Stay (HOSPITAL_COMMUNITY): Payer: Self-pay

## 2020-08-10 DIAGNOSIS — I469 Cardiac arrest, cause unspecified: Secondary | ICD-10-CM

## 2020-08-10 LAB — BASIC METABOLIC PANEL
Anion gap: 18 — ABNORMAL HIGH (ref 5–15)
Anion gap: 13 (ref 5–15)
BUN: 23 mg/dL — ABNORMAL HIGH (ref 6–20)
BUN: 33 mg/dL — ABNORMAL HIGH (ref 6–20)
CO2: 17 mmol/L — ABNORMAL LOW (ref 22–32)
CO2: 21 mmol/L — ABNORMAL LOW (ref 22–32)
Calcium: 8.5 mg/dL — ABNORMAL LOW (ref 8.9–10.3)
Calcium: 8.5 mg/dL — ABNORMAL LOW (ref 8.9–10.3)
Chloride: 102 mmol/L (ref 98–111)
Chloride: 102 mmol/L (ref 98–111)
Creatinine, Ser: 3.24 mg/dL — ABNORMAL HIGH (ref 0.61–1.24)
Creatinine, Ser: 4.28 mg/dL — ABNORMAL HIGH (ref 0.61–1.24)
GFR, Estimated: 23 mL/min — ABNORMAL LOW (ref >60)
GFR, Estimated: 16 mL/min — ABNORMAL LOW (ref >60)
Glucose, Bld: 218 mg/dL — ABNORMAL HIGH (ref 70–99)
Glucose, Bld: 156 mg/dL — ABNORMAL HIGH (ref 70–99)
Potassium: 5.8 mmol/L — ABNORMAL HIGH (ref 3.5–5.1)
Potassium: 4.8 mmol/L (ref 3.5–5.1)
Sodium: 137 mmol/L (ref 135–145)
Sodium: 136 mmol/L (ref 135–145)

## 2020-08-10 LAB — POCT I-STAT 7, (LYTES, BLD GAS, ICA,H+H)
Acid-base deficit: 10 mmol/L — ABNORMAL HIGH (ref 0.0–2.0)
Acid-base deficit: 6 mmol/L — ABNORMAL HIGH (ref 0.0–2.0)
Acid-base deficit: 6 mmol/L — ABNORMAL HIGH (ref 0.0–2.0)
Bicarbonate: 16 mmol/L — ABNORMAL LOW (ref 20.0–28.0)
Bicarbonate: 20.2 mmol/L (ref 20.0–28.0)
Bicarbonate: 25.1 mmol/L (ref 20.0–28.0)
Calcium, Ion: 1.1 mmol/L — ABNORMAL LOW (ref 1.15–1.40)
Calcium, Ion: 1.13 mmol/L — ABNORMAL LOW (ref 1.15–1.40)
Calcium, Ion: 1.2 mmol/L (ref 1.15–1.40)
HCT: 46 % (ref 39.0–52.0)
HCT: 49 % (ref 39.0–52.0)
HCT: 51 % (ref 39.0–52.0)
Hemoglobin: 15.6 g/dL (ref 13.0–17.0)
Hemoglobin: 16.7 g/dL (ref 13.0–17.0)
Hemoglobin: 17.3 g/dL — ABNORMAL HIGH (ref 13.0–17.0)
O2 Saturation: 92 %
O2 Saturation: 93 %
O2 Saturation: 98 %
Patient temperature: 36.7
Patient temperature: 37.3
Patient temperature: 38.6
Potassium: 5.2 mmol/L — ABNORMAL HIGH (ref 3.5–5.1)
Potassium: 5.8 mmol/L — ABNORMAL HIGH (ref 3.5–5.1)
Potassium: 6.3 mmol/L (ref 3.5–5.1)
Sodium: 136 mmol/L (ref 135–145)
Sodium: 136 mmol/L (ref 135–145)
Sodium: 138 mmol/L (ref 135–145)
TCO2: 17 mmol/L — ABNORMAL LOW (ref 22–32)
TCO2: 21 mmol/L — ABNORMAL LOW (ref 22–32)
TCO2: 27 mmol/L (ref 22–32)
pCO2 arterial: 33.9 mmHg (ref 32.0–48.0)
pCO2 arterial: 42.6 mmHg (ref 32.0–48.0)
pCO2 arterial: 77.2 mmHg (ref 32.0–48.0)
pH, Arterial: 7.129 — CL (ref 7.350–7.450)
pH, Arterial: 7.282 — ABNORMAL LOW (ref 7.350–7.450)
pH, Arterial: 7.285 — ABNORMAL LOW (ref 7.350–7.450)
pO2, Arterial: 117 mmHg — ABNORMAL HIGH (ref 83.0–108.0)
pO2, Arterial: 73 mmHg — ABNORMAL LOW (ref 83.0–108.0)
pO2, Arterial: 91 mmHg (ref 83.0–108.0)

## 2020-08-10 LAB — URINE CULTURE: Culture: NO GROWTH

## 2020-08-10 LAB — CBC
HCT: 50.4 % (ref 39.0–52.0)
Hemoglobin: 17 g/dL (ref 13.0–17.0)
MCH: 33.2 pg (ref 26.0–34.0)
MCHC: 33.7 g/dL (ref 30.0–36.0)
MCV: 98.4 fL (ref 80.0–100.0)
Platelets: 195 10*3/uL (ref 150–400)
RBC: 5.12 MIL/uL (ref 4.22–5.81)
RDW: 12.6 % (ref 11.5–15.5)
WBC: 18.5 10*3/uL — ABNORMAL HIGH (ref 4.0–10.5)
nRBC: 0 % (ref 0.0–0.2)

## 2020-08-10 LAB — HEPATIC FUNCTION PANEL
ALT: 56 U/L — ABNORMAL HIGH (ref 0–44)
AST: 64 U/L — ABNORMAL HIGH (ref 15–41)
Albumin: 3.2 g/dL — ABNORMAL LOW (ref 3.5–5.0)
Alkaline Phosphatase: 55 U/L (ref 38–126)
Bilirubin, Direct: <0.1 mg/dL (ref 0.0–0.2)
Total Bilirubin: 0.8 mg/dL (ref 0.3–1.2)
Total Protein: 6.7 g/dL (ref 6.5–8.1)

## 2020-08-10 LAB — TSH: TSH: 1.672 u[IU]/mL (ref 0.350–4.500)

## 2020-08-10 LAB — ABO/RH: ABO/RH(D): A POS

## 2020-08-10 LAB — CREATININE, URINE, RANDOM: Creatinine, Urine: 126.86 mg/dL

## 2020-08-10 LAB — MAGNESIUM
Magnesium: 1.8 mg/dL (ref 1.7–2.4)
Magnesium: 2.8 mg/dL — ABNORMAL HIGH (ref 1.7–2.4)
Magnesium: 2.3 mg/dL (ref 1.7–2.4)

## 2020-08-10 LAB — TROPONIN I (HIGH SENSITIVITY)
Troponin I (High Sensitivity): 342 ng/L (ref <18)
Troponin I (High Sensitivity): 390 ng/L (ref <18)

## 2020-08-10 LAB — ECHOCARDIOGRAM COMPLETE
Area-P 1/2: 3.99 cm2
Height: 68 in
Weight: 4419.78 oz

## 2020-08-10 LAB — GLUCOSE, CAPILLARY
Glucose-Capillary: 115 mg/dL — ABNORMAL HIGH (ref 70–99)
Glucose-Capillary: 131 mg/dL — ABNORMAL HIGH (ref 70–99)
Glucose-Capillary: 158 mg/dL — ABNORMAL HIGH (ref 70–99)
Glucose-Capillary: 192 mg/dL — ABNORMAL HIGH (ref 70–99)

## 2020-08-10 LAB — AMMONIA: Ammonia: 42 umol/L — ABNORMAL HIGH (ref 9–35)

## 2020-08-10 LAB — PHOSPHORUS
Phosphorus: 3.7 mg/dL (ref 2.5–4.6)
Phosphorus: 2.6 mg/dL (ref 2.5–4.6)
Phosphorus: 2.3 mg/dL — ABNORMAL LOW (ref 2.5–4.6)

## 2020-08-10 LAB — TRIGLYCERIDES: Triglycerides: 293 mg/dL — ABNORMAL HIGH (ref <150)

## 2020-08-10 LAB — MRSA PCR SCREENING: MRSA by PCR: NEGATIVE

## 2020-08-10 LAB — SODIUM, URINE, RANDOM: Sodium, Ur: 11 mmol/L

## 2020-08-10 MED ORDER — SODIUM CHLORIDE 0.9% FLUSH
10.0000 mL | INTRAVENOUS | Status: DC | PRN
  Administered 2020-08-20: 10 mL

## 2020-08-10 MED ORDER — FOLIC ACID 1 MG PO TABS
1.0000 mg | ORAL_TABLET | Freq: Every day | ORAL | Status: DC
  Administered 2020-08-11 – 2020-08-25 (×15): 1 mg
  Filled 2020-08-10 (×15): qty 1

## 2020-08-10 MED ORDER — VITAL AF 1.2 CAL PO LIQD
1000.0000 mL | ORAL | Status: DC
  Administered 2020-08-10 – 2020-08-13 (×3): 1000 mL

## 2020-08-10 MED ORDER — INSULIN ASPART 100 UNIT/ML ~~LOC~~ SOLN
0.0000 [IU] | SUBCUTANEOUS | Status: DC
  Administered 2020-08-10: 1 [IU] via SUBCUTANEOUS
  Administered 2020-08-10: 2 [IU] via SUBCUTANEOUS
  Administered 2020-08-11 – 2020-08-12 (×4): 1 [IU] via SUBCUTANEOUS
  Administered 2020-08-12: 2 [IU] via SUBCUTANEOUS
  Administered 2020-08-12 (×2): 1 [IU] via SUBCUTANEOUS
  Administered 2020-08-12: 2 [IU] via SUBCUTANEOUS
  Administered 2020-08-13 (×5): 1 [IU] via SUBCUTANEOUS
  Administered 2020-08-14: 2 [IU] via SUBCUTANEOUS
  Administered 2020-08-14 (×3): 1 [IU] via SUBCUTANEOUS
  Administered 2020-08-14 (×2): 2 [IU] via SUBCUTANEOUS
  Administered 2020-08-14: 1 [IU] via SUBCUTANEOUS
  Administered 2020-08-15 (×2): 2 [IU] via SUBCUTANEOUS
  Administered 2020-08-15 (×2): 1 [IU] via SUBCUTANEOUS
  Administered 2020-08-16 (×2): 2 [IU] via SUBCUTANEOUS
  Administered 2020-08-16 (×2): 1 [IU] via SUBCUTANEOUS
  Administered 2020-08-16 – 2020-08-17 (×3): 2 [IU] via SUBCUTANEOUS
  Administered 2020-08-17: 5 [IU] via SUBCUTANEOUS
  Administered 2020-08-18: 1 [IU] via SUBCUTANEOUS
  Administered 2020-08-18 (×4): 2 [IU] via SUBCUTANEOUS
  Administered 2020-08-18: 1 [IU] via SUBCUTANEOUS
  Administered 2020-08-19: 2 [IU] via SUBCUTANEOUS
  Administered 2020-08-19: 1 [IU] via SUBCUTANEOUS
  Administered 2020-08-19: 2 [IU] via SUBCUTANEOUS
  Administered 2020-08-19: 1 [IU] via SUBCUTANEOUS
  Administered 2020-08-19: 2 [IU] via SUBCUTANEOUS
  Administered 2020-08-20 (×3): 1 [IU] via SUBCUTANEOUS
  Administered 2020-08-20 – 2020-08-21 (×5): 2 [IU] via SUBCUTANEOUS
  Administered 2020-08-21: 1 [IU] via SUBCUTANEOUS
  Administered 2020-08-21: 2 [IU] via SUBCUTANEOUS
  Administered 2020-08-22 (×2): 1 [IU] via SUBCUTANEOUS
  Administered 2020-08-22 – 2020-08-23 (×8): 2 [IU] via SUBCUTANEOUS
  Administered 2020-08-23: 3 [IU] via SUBCUTANEOUS
  Administered 2020-08-23 – 2020-08-25 (×8): 2 [IU] via SUBCUTANEOUS
  Administered 2020-08-25: 3 [IU] via SUBCUTANEOUS
  Administered 2020-08-25 (×4): 2 [IU] via SUBCUTANEOUS
  Administered 2020-08-26 (×2): 1 [IU] via SUBCUTANEOUS

## 2020-08-10 MED ORDER — PANTOPRAZOLE SODIUM 40 MG PO PACK: 40.0000 mg | PACK | Freq: Every day | ORAL | Status: DC

## 2020-08-10 MED ORDER — ORAL CARE MOUTH RINSE
15.0000 mL | OROMUCOSAL | Status: DC
  Administered 2020-08-10 – 2020-08-26 (×155): 15 mL via OROMUCOSAL

## 2020-08-10 MED ORDER — SODIUM ZIRCONIUM CYCLOSILICATE 10 G PO PACK
10.0000 g | PACK | Freq: Three times a day (TID) | ORAL | Status: DC
  Filled 2020-08-10: qty 1

## 2020-08-10 MED ORDER — PANTOPRAZOLE SODIUM 40 MG IV SOLR
40.0000 mg | Freq: Every day | INTRAVENOUS | Status: DC
  Administered 2020-08-10 – 2020-08-19 (×10): 40 mg via INTRAVENOUS
  Filled 2020-08-10 (×10): qty 40

## 2020-08-10 MED ORDER — INSULIN ASPART 100 UNIT/ML IV SOLN
5.0000 [IU] | Freq: Once | INTRAVENOUS | Status: AC
  Administered 2020-08-10: 5 [IU] via INTRAVENOUS

## 2020-08-10 MED ORDER — HYDRALAZINE HCL 20 MG/ML IJ SOLN
5.0000 mg | INTRAMUSCULAR | Status: DC | PRN
  Administered 2020-08-11 – 2020-08-16 (×5): 5 mg via INTRAVENOUS
  Filled 2020-08-10 (×4): qty 1

## 2020-08-10 MED ORDER — THIAMINE HCL 100 MG PO TABS: 100.0000 mg | ORAL_TABLET | Freq: Every day | ORAL | Status: DC

## 2020-08-10 MED ORDER — HEPARIN SODIUM (PORCINE) 5000 UNIT/ML IJ SOLN
5000.0000 [IU] | Freq: Three times a day (TID) | INTRAMUSCULAR | Status: DC
  Administered 2020-08-10 – 2020-08-25 (×43): 5000 [IU] via SUBCUTANEOUS
  Filled 2020-08-10 (×45): qty 1

## 2020-08-10 MED ORDER — NOREPINEPHRINE 4 MG/250ML-% IV SOLN: 0.0000 ug/min | INTRAVENOUS | Status: DC

## 2020-08-10 MED ORDER — SODIUM CHLORIDE 0.9 % IV SOLN
3.0000 g | Freq: Three times a day (TID) | INTRAVENOUS | Status: DC
  Administered 2020-08-10: 3 g via INTRAVENOUS
  Filled 2020-08-10 (×3): qty 8

## 2020-08-10 MED ORDER — PROSOURCE TF PO LIQD
45.0000 mL | Freq: Three times a day (TID) | ORAL | Status: DC
  Administered 2020-08-10 – 2020-08-14 (×12): 45 mL
  Filled 2020-08-10 (×12): qty 45

## 2020-08-10 MED ORDER — SODIUM BICARBONATE 8.4 % IV SOLN
INTRAVENOUS | Status: DC
  Filled 2020-08-10 (×11): qty 850

## 2020-08-10 MED ORDER — THIAMINE HCL 100 MG PO TABS
100.0000 mg | ORAL_TABLET | Freq: Every day | ORAL | Status: DC
  Administered 2020-08-12 – 2020-08-25 (×14): 100 mg
  Filled 2020-08-10 (×14): qty 1

## 2020-08-10 MED ORDER — THIAMINE HCL 100 MG/ML IJ SOLN
100.0000 mg | Freq: Every day | INTRAMUSCULAR | Status: DC
  Administered 2020-08-10 – 2020-08-11 (×2): 100 mg via INTRAVENOUS
  Filled 2020-08-10 (×4): qty 2

## 2020-08-10 MED ORDER — DEXTROSE 50 % IV SOLN
50.0000 mL | Freq: Once | INTRAVENOUS | Status: AC
  Administered 2020-08-10: 50 mL via INTRAVENOUS
  Filled 2020-08-10: qty 50

## 2020-08-10 MED ORDER — CHLORHEXIDINE GLUCONATE CLOTH 2 % EX PADS
6.0000 | MEDICATED_PAD | Freq: Every day | CUTANEOUS | Status: DC
  Administered 2020-08-10 – 2020-08-20 (×8): 6 via TOPICAL

## 2020-08-10 MED ORDER — CHLORHEXIDINE GLUCONATE 0.12% ORAL RINSE (MEDLINE KIT)
15.0000 mL | Freq: Two times a day (BID) | OROMUCOSAL | Status: DC
  Administered 2020-08-10 – 2020-08-26 (×33): 15 mL via OROMUCOSAL

## 2020-08-10 MED ORDER — SODIUM CHLORIDE 0.9 % IV SOLN
3.0000 g | Freq: Two times a day (BID) | INTRAVENOUS | Status: DC
  Administered 2020-08-11 – 2020-08-14 (×7): 3 g via INTRAVENOUS
  Filled 2020-08-10: qty 3
  Filled 2020-08-10 (×3): qty 8
  Filled 2020-08-10: qty 3
  Filled 2020-08-10: qty 8
  Filled 2020-08-10: qty 3
  Filled 2020-08-10: qty 8

## 2020-08-10 MED ORDER — FOLIC ACID 1 MG PO TABS: 1.0000 mg | ORAL_TABLET | Freq: Every day | ORAL | Status: DC

## 2020-08-10 MED ORDER — MAGNESIUM SULFATE 2 GM/50ML IV SOLN
2.0000 g | Freq: Once | INTRAVENOUS | Status: AC
  Administered 2020-08-10: 2 g via INTRAVENOUS
  Filled 2020-08-10: qty 50

## 2020-08-10 MED ORDER — SODIUM CHLORIDE 0.9 % IV SOLN
1.0000 mg | Freq: Every day | INTRAVENOUS | Status: DC
  Administered 2020-08-10: 1 mg via INTRAVENOUS
  Filled 2020-08-10 (×5): qty 0.2

## 2020-08-10 MED ORDER — PERFLUTREN LIPID MICROSPHERE
1.0000 mL | INTRAVENOUS | Status: AC | PRN
  Administered 2020-08-10: 5 mL via INTRAVENOUS
  Filled 2020-08-10: qty 10

## 2020-08-10 MED ORDER — SODIUM CHLORIDE 0.9% FLUSH
10.0000 mL | Freq: Two times a day (BID) | INTRAVENOUS | Status: DC
  Administered 2020-08-10: 20 mL
  Administered 2020-08-10 – 2020-08-25 (×26): 10 mL

## 2020-08-10 NOTE — Procedures (Signed)
Cortrak  Person Inserting Tube:  Aliscia Clayton, RD Tube Type:  Cortrak - 43 inches Tube Location:  Left nare Initial Placement:  Stomach Secured by: Bridle Technique Used to Measure Tube Placement:  Documented cm marking at nare/ corner of mouth Cortrak Secured At:  75 cm   No x-ray is required. RN may begin using tube.   If the tube becomes dislodged please keep the tube and contact the Cortrak team at [email or web address] ([email or web address]) for replacement.  If after hours and replacement cannot be delayed, place a NG tube and confirm placement with an abdominal x-ray.    Fabienne Nolasco RD, LDN Clinical Nutrition Pager listed in AMION    

## 2020-08-10 NOTE — Progress Notes (Signed)
Pt transported from Trauma A to CT and then 2H 20 without complication. Report given to receiving RT Lily prior to arrival on unit. RT Breckin continue to monitor.

## 2020-08-10 NOTE — Procedures (Signed)
Patient Name: Tyler [name]  MRN: [id number]  Epilepsy Attending: Charlsie Quest  Referring Physician/Provider: Emilie Rutter, PA Date: 08/10/2020 Duration: 26.31 mins  Patient history: 49yo M s/p cardiac arrest. EEG to evaluate for seizure  Level of alertness: comatose  AEDs during EEG study: Propofol  Technical aspects: This EEG study was done with scalp electrodes positioned according to the 10-20 International system of electrode placement. Electrical activity was acquired at a sampling rate of 500Hz  and reviewed with a high frequency filter of 70Hz  and a low frequency filter of 1Hz . EEG data were recorded continuously and digitally stored.   Description: EEG showed continuous generalized 2-3 Hz delta slowing. Generalized periodic discharges with triphasic morphology at 0.75-1Hz  were also noted. EEG was reactive to noxious stimulation.  Hyperventilation and photic stimulation were not performed.     ABNORMALITY -Continuous slow, generalized -Periodic discharges with triphasic morphology, generalized  IMPRESSION: This study is suggestive of severe diffuse encephalopathy, nonspecific etiology but likely related to sedation,  anoxic/hypoxic brain injury. Periodic discharges with triphasic morphology were also noted which can be on the ictal-interictal continuum and are most likely secondary to underlying anoxic-hypoxic injury. No seizures were seen throughout the recording.  Abrey Bradway 

## 2020-08-10 NOTE — Progress Notes (Signed)
EEG complete - results pending 

## 2020-08-10 NOTE — Progress Notes (Signed)
eLink Physician-Brief Progress Note Patient Name: Tyler [name] DOB: [birth year] MRN: [id number]   Date of Service  08/10/2020  HPI/Events of Note  Inadequate ventilation presumably related to undersized tracheostomy tube. Patient is not dyssynchronous per RT but tidal volumes are sub-optimal.  eICU Interventions  RR increased to 30, ABG in 30 minutes.        [name] 08/10/2020, 1:34 AM

## 2020-08-10 NOTE — Progress Notes (Signed)
  Echocardiogram 2D Echocardiogram has been performed.  Stark Bray Swaim 08/10/2020, 10:12 AM

## 2020-08-10 NOTE — Progress Notes (Signed)
eLink Physician-Brief Progress Note Patient Name: DMARION PERFECT DOB: March 23, 1971 MRN: [id number]   Date of Service  08/10/2020  HPI/Events of Note  ABG result following increase in respiratory rate reviewed, and significantly improved.  eICU Interventions  Continue current ventilator settings.        Thomasene Lot Kaelie Henigan 08/10/2020, 5:40 AM

## 2020-08-10 NOTE — Progress Notes (Signed)
eLink Physician-Brief Progress Note Patient Name: Tyler [name] DOB: [birth year] MRN: [id number]   Date of Service  08/10/2020  HPI/Events of Note  Patient with out of hospital cardiac arrest, coma, and acute hypoxic respiratory failure admitted to the ICU.  eICU Interventions  New Patient Evaluation completed.        Thomasene Lot Venida Tsukamoto 08/10/2020, 12:11 AM

## 2020-08-10 NOTE — Consult Note (Signed)
Reason for Consult: Respiratory failure, PEA  HPI:  Abram B Hartwell is an 49 y.o. male who was admitted yesterday after a witnessed cardiac arrest at work. FD arrived, found him to be pulseless, so started CPR. EMS then arrived at the scene and states pt achieved ROSC after 4 rounds of CPR. King airway placed. As pt being transported to ED, noted to have bradycardia and resistance with bagging. Pt hypertensive en route. Due to concerns for upper airway obstruction with difficult BVM, cricothyrotomy was performed by EMS. ENT is now consulted for trach revision.  No past medical history on file.  No family history on file.  Social History:  has no history on file for tobacco use, alcohol use, and drug use.  Allergies: No Known Allergies  Prior to Admission medications   Not on File    Medications:  I have reviewed the patient's current medications. Scheduled: . acetaminophen  650 mg Oral Q4H   Or  . acetaminophen (TYLENOL) oral liquid 160 mg/5 mL  650 mg Per Tube Q4H   Or  . acetaminophen  650 mg Rectal Q4H  . chlorhexidine gluconate (MEDLINE KIT)  15 mL Mouth Rinse BID  . Chlorhexidine Gluconate Cloth  6 each Topical Daily  . feeding supplement (PROSource TF)  45 mL Per Tube TID  . folic acid  1 mg Per Tube Daily  . heparin injection (subcutaneous)  5,000 Units Subcutaneous Q8H  . insulin aspart  0-9 Units Subcutaneous Q4H  . mouth rinse  15 mL Mouth Rinse 10 times per day  . pantoprazole (PROTONIX) IV  40 mg Intravenous QHS  . polyethylene glycol  17 g Per Tube Daily  . sodium chloride flush  10-40 mL Intracatheter Q12H  . thiamine injection  100 mg Intravenous Daily   Or  . thiamine  100 mg Per Tube Daily   Continuous: . sodium chloride 10 mL/hr at 08/08/2020 1959  . ampicillin-sulbactam (UNASYN) IV 3 g (08/10/20 1418)  . feeding supplement (VITAL AF 1.2 CAL) 1,000 mL (08/10/20 1427)  . fentaNYL infusion INTRAVENOUS 200 mcg/hr (08/10/20 0831)  . folic acid (FOLVITE) IVPB 1  mg (08/10/20 1330)  . norepinephrine (LEVOPHED) Adult infusion Stopped (08/10/20 0531)  . propofol (DIPRIVAN) infusion 15 mcg/kg/min (08/10/20 0606)  . sodium bicarbonate (isotonic) 150 mEq in D5W 1000 mL infusion 75 mL/hr at 08/10/20 1327    Results for orders placed or performed during the hospital encounter of 08/17/2020 (from the past 48 hour(s))  Resp Panel by RT-PCR (Flu A&B, Covid) Nasopharyngeal Swab     Status: None   Collection Time: 07/31/2020  5:29 PM   Specimen: Nasopharyngeal Swab; Nasopharyngeal(NP) swabs in vial transport medium  Result Value Ref Range   SARS Coronavirus 2 by RT PCR NEGATIVE NEGATIVE    Comment: (NOTE) SARS-CoV-2 target nucleic acids are NOT DETECTED.  The SARS-CoV-2 RNA is generally detectable in upper respiratory specimens during the acute phase of infection. The lowest concentration of SARS-CoV-2 viral copies this assay can detect is 138 copies/mL. A negative result does not preclude SARS-Cov-2 infection and should not be used as the sole basis for treatment or other patient management decisions. A negative result may occur with  improper specimen collection/handling, submission of specimen other than nasopharyngeal swab, presence of viral mutation(s) within the areas targeted by this assay, and inadequate number of viral copies(<138 copies/mL). A negative result must be combined with clinical observations, patient history, and epidemiological information. The expected result is Negative.  Fact Sheet for  Patients:  EntrepreneurPulse.com.au  Fact Sheet for Healthcare Providers:  IncredibleEmployment.be  This test is no t yet approved or cleared by the [place] FDA and  has been authorized for detection and/or diagnosis of SARS-CoV-2 by FDA under an Emergency Use Authorization (EUA). This EUA Gerik remain  in effect (meaning this test can be used) for the duration of the COVID-19 declaration under Section  564(b)(1) of the Act, 21 U.S.C.section 360bbb-3(b)(1), unless the authorization is terminated  or revoked sooner.       Influenza A by PCR NEGATIVE NEGATIVE   Influenza B by PCR NEGATIVE NEGATIVE    Comment: (NOTE) The Xpert Xpress SARS-CoV-2/FLU/RSV plus assay is intended as an aid in the diagnosis of influenza from Nasopharyngeal swab specimens and should not be used as a sole basis for treatment. Nasal washings and aspirates are unacceptable for Xpert Xpress SARS-CoV-2/FLU/RSV testing.  Fact Sheet for Patients: EntrepreneurPulse.com.au  Fact Sheet for Healthcare Providers: IncredibleEmployment.be  This test is not yet approved or cleared by the [place] FDA and has been authorized for detection and/or diagnosis of SARS-CoV-2 by FDA under an Emergency Use Authorization (EUA). This EUA Tahir remain in effect (meaning this test can be used) for the duration of the COVID-19 declaration under Section 564(b)(1) of the Act, 21 U.S.C. section 360bbb-3(b)(1), unless the authorization is terminated or revoked.  Performed at [facility], [place] [address] [place], [place] [address]   Hemoglobin A1c     Status: Abnormal   Collection Time: 08/14/2020  6:05 PM  Result Value Ref Range   Hgb A1c MFr Bld 5.9 (H) 4.8 - 5.6 %    Comment: (NOTE) Pre diabetes:          5.7%-6.4%  Diabetes:              >6.4%  Glycemic control for   <7.0% adults with diabetes    Mean Plasma Glucose 122.63 mg/dL    Comment: Performed at [facility] [address] [place], [place] [address]  CBC with Differential/Platelet     Status: Abnormal   Collection Time: 08/06/2020  6:05 PM  Result Value Ref Range   WBC 17.0 (H) 4.0 - 10.5 K/uL   RBC 4.94 4.22 - 5.81 MIL/uL   Hemoglobin 16.7 13.0 - 17.0 g/dL   HCT 50.2 39.0 - 52.0 %   MCV 101.6 (H) 80.0 - 100.0 fL   MCH 33.8 26.0 - 34.0 pg   MCHC 33.3 30.0 - 36.0 g/dL   RDW 12.4 11.5 - 15.5 %   Platelets 289  150 - 400 K/uL   nRBC 0.2 0.0 - 0.2 %   Neutrophils Relative % 68 %   Neutro Abs 11.6 (H) 1.7 - 7.7 K/uL   Lymphocytes Relative 22 %   Lymphs Abs 3.7 0.7 - 4.0 K/uL   Monocytes Relative 7 %   Monocytes Absolute 1.2 (H) 0.1 - 1.0 K/uL   Eosinophils Relative 3 %   Eosinophils Absolute 0.5 0.0 - 0.5 K/uL   Basophils Relative 0 %   Basophils Absolute 0.0 0.0 - 0.1 K/uL   nRBC 0 0 /100 WBC   Abs Immature Granulocytes 0.00 0.00 - 0.07 K/uL   Polychromasia PRESENT     Comment: Performed at [facility], [place] [address], [place], [place] [address]  Protime-INR     Status: None   Collection Time: 08/08/2020  6:05 PM  Result Value Ref Range   Prothrombin Time 14.2 11.4 -  15.2 seconds   INR 1.1 0.8 - 1.2    Comment: (NOTE) INR goal varies based on device and disease states. Performed at [facility], [place] [address] [place], [place] [address]   APTT     Status: Abnormal   Collection Time: 08/07/2020  6:05 PM  Result Value Ref Range   aPTT 20 (L) 24 - 36 seconds    Comment: Performed at [facility] [address], [place], [place] [address]  Comprehensive metabolic panel     Status: Abnormal   Collection Time: 07/26/2020  6:05 PM  Result Value Ref Range   Sodium 140 135 - 145 mmol/L   Potassium 4.0 3.5 - 5.1 mmol/L   Chloride 102 98 - 111 mmol/L   CO2 22 22 - 32 mmol/L   Glucose, Bld 310 (H) 70 - 99 mg/dL    Comment: Glucose reference range applies only to samples taken after fasting for at least 8 hours.   BUN 11 6 - 20 mg/dL   Creatinine, Ser 1.79 (H) 0.61 - 1.24 mg/dL   Calcium 8.7 (L) 8.9 - 10.3 mg/dL   Total Protein 6.4 (L) 6.5 - 8.1 g/dL   Albumin 3.2 (L) 3.5 - 5.0 g/dL   AST 104 (H) 15 - 41 U/L   ALT 55 (H) 0 - 44 U/L   Alkaline Phosphatase 83 38 - 126 U/L   Total Bilirubin 0.9 0.3 - 1.2 mg/dL   GFR, Estimated 46 (L) >60 mL/min    Comment: (NOTE) Calculated using the CKD-EPI Creatinine Equation (2021)    Anion gap 16 (H) 5 - 15    Comment: Performed at  [facility], [place] [address] [place], [place] [address]  Troponin I (High Sensitivity)     Status: Abnormal   Collection Time: 08/23/2020  6:05 PM  Result Value Ref Range   Troponin I (High Sensitivity) 28 (H) <18 ng/L    Comment: (NOTE) Elevated high sensitivity troponin I (hsTnI) values and significant  changes across serial measurements may suggest ACS but many other  chronic and acute conditions are known to elevate hsTnI results.  Refer to the "Links" section for chest pain algorithms and additional  guidance. Performed at [facility], [place] [address] [place], [place], [place] [address]   Lipid panel     Status: Abnormal   Collection Time: 08/23/2020  6:05 PM  Result Value Ref Range   Cholesterol 268 (H) 0 - 200 mg/dL   Triglycerides 519 (H) <150 mg/dL   HDL 31 (L) >40 mg/dL   Total CHOL/HDL Ratio 8.6 RATIO   VLDL UNABLE TO CALCULATE IF TRIGLYCERIDE OVER 400 mg/dL 0 - 40 mg/dL   LDL Cholesterol UNABLE TO CALCULATE IF TRIGLYCERIDE OVER 400 mg/dL 0 - 99 mg/dL    Comment:        Total Cholesterol/HDL:CHD Risk Coronary Heart Disease Risk Table                     Men   Women  1/2 Average Risk   3.4   3.3  Average Risk       5.0   4.4  2 X Average Risk   9.6   7.1  3 X Average Risk  23.4   11.0        Use the calculated Patient Ratio above and the CHD Risk Table to determine the patient's CHD Risk.        ATP III CLASSIFICATION (  LDL):  <100     mg/dL   Optimal  100-129  mg/dL   Near or Above                    Optimal  130-159  mg/dL   Borderline  160-189  mg/dL   High  >190     mg/dL   Very High Performed at [facility] [address] [place], [place] [address]   Blood culture (routine x 2)     Status: None (Preliminary result)   Collection Time: 08/07/2020  6:05 PM   Specimen: BLOOD  Result Value Ref Range   Specimen Description BLOOD CENTRAL LINE    Special Requests      BOTTLES DRAWN AEROBIC AND ANAEROBIC Blood Culture adequate volume   Culture      NO  GROWTH < 12 HOURS Performed at [facility], [place] [address], [place], [place] [address]    Report Status PENDING   Lactic acid, plasma     Status: Abnormal   Collection Time: 08/20/2020  6:05 PM  Result Value Ref Range   Lactic Acid, Venous 6.8 (HH) 0.5 - 1.9 mmol/L    Comment: CRITICAL RESULT CALLED TO, READ BACK BY AND VERIFIED WITH: Caleen Essex RN [phone] K FORSYTH Performed at [facility], [address], [place], [place] [address]   Ethanol     Status: Abnormal   Collection Time: 08/14/2020  6:05 PM  Result Value Ref Range   Alcohol, Ethyl (B) 55 (H) <10 mg/dL    Comment: (NOTE) Lowest detectable limit for serum alcohol is 10 mg/dL.  For medical purposes only. Performed at [facility], [place] [address], [place], [place] [address]   Type and screen [place]     Status: None   Collection Time: 08/02/2020  6:05 PM  Result Value Ref Range   ABO/RH(D) A POS    Antibody Screen NEG    Sample Expiration      08/12/2020,2359 Performed at [facility], [place] [address], [place], [place] [address]   LDL cholesterol, direct     Status: Abnormal   Collection Time: 08/23/2020  6:05 PM  Result Value Ref Range   Direct LDL 154.4 (H) 0 - 99 mg/dL    Comment: Performed at [facility] [address] [place], [place] [address]  I-Stat Chem 8, ED     Status: Abnormal   Collection Time: 08/05/2020  6:21 PM  Result Value Ref Range   Sodium 139 135 - 145 mmol/L   Potassium 4.1 3.5 - 5.1 mmol/L   Chloride 104 98 - 111 mmol/L   BUN 16 6 - 20 mg/dL   Creatinine, Ser 1.80 (H) 0.61 - 1.24 mg/dL   Glucose, Bld 299 (H) 70 - 99 mg/dL    Comment: Glucose reference range applies only to samples taken after fasting for at least 8 hours.   Calcium, Ion 1.10 (L) 1.15 - 1.40 mmol/L   TCO2 24 22 - 32 mmol/L   Hemoglobin 16.7 13.0 - 17.0 g/dL   HCT 49.0 39.0 - 52.0 %  Urinalysis, Routine w reflex microscopic Urine, Catheterized     Status: Abnormal    Collection Time: 08/10/2020  7:04 PM  Result Value Ref Range   Color, Urine YELLOW YELLOW   APPearance CLOUDY (A) CLEAR   Specific Gravity, Urine 1.014 1.005 - 1.030   pH 5.0 5.0 - 8.0  Glucose, UA NEGATIVE NEGATIVE mg/dL   Hgb urine dipstick MODERATE (A) NEGATIVE   Bilirubin Urine NEGATIVE NEGATIVE   Ketones, ur NEGATIVE NEGATIVE mg/dL   Protein, ur 30 (A) NEGATIVE mg/dL   Nitrite NEGATIVE NEGATIVE   Leukocytes,Ua TRACE (A) NEGATIVE   RBC / HPF 11-20 0 - 5 RBC/hpf   WBC, UA 11-20 0 - 5 WBC/hpf   Bacteria, UA FEW (A) NONE SEEN   Squamous Epithelial / LPF 0-5 0 - 5   Mucus PRESENT     Comment: Performed at [facility], [place] [address], [place], [place] [address]  Rapid urine drug screen (hospital performed)     Status: None   Collection Time: 08/08/2020  7:05 PM  Result Value Ref Range   Opiates NONE DETECTED NONE DETECTED   Cocaine NONE DETECTED NONE DETECTED   Benzodiazepines NONE DETECTED NONE DETECTED   Amphetamines NONE DETECTED NONE DETECTED   Tetrahydrocannabinol NONE DETECTED NONE DETECTED   Barbiturates NONE DETECTED NONE DETECTED    Comment: (NOTE) DRUG SCREEN FOR MEDICAL PURPOSES ONLY.  IF CONFIRMATION IS NEEDED FOR ANY PURPOSE, NOTIFY LAB WITHIN 5 DAYS.  LOWEST DETECTABLE LIMITS FOR URINE DRUG SCREEN Drug Class                     Cutoff (ng/mL) Amphetamine and metabolites    1000 Barbiturate and metabolites    200 Benzodiazepine                 150 Tricyclics and metabolites     300 Opiates and metabolites        300 Cocaine and metabolites        300 THC                            50 Performed at [facility], [place] [address], [place], [place] [address]   HIV Antibody (routine testing w rflx)     Status: None   Collection Time: 08/04/2020  7:40 PM  Result Value Ref Range   HIV Screen 4th Generation wRfx Non Reactive Non Reactive    Comment: Performed at [facility], [place] [address] [place], [place] [address]  Magnesium     Status: None    Collection Time: 08/23/2020  7:40 PM  Result Value Ref Range   Magnesium 2.2 1.7 - 2.4 mg/dL    Comment: Performed at [facility], [place] [address], [place], [place] [address]  Troponin I (High Sensitivity)     Status: Abnormal   Collection Time: 08/16/2020  7:40 PM  Result Value Ref Range   Troponin I (High Sensitivity) 86 (H) <18 ng/L    Comment: RESULT CALLED TO, READ BACK BY AND VERIFIED WITH: CRITHON M,RN 08/08/2020 2152 WAYK Performed at [facility], [place] [address] [place], [place] [address]   Lactic acid, plasma     Status: Abnormal   Collection Time: 08/05/2020  7:53 PM  Result Value Ref Range   Lactic Acid, Venous 4.1 (HH) 0.5 - 1.9 mmol/L    Comment: CRITICAL VALUE NOTED.  VALUE IS CONSISTENT WITH PREVIOUSLY REPORTED AND CALLED VALUE. Performed at [facility], [place] [address] [place], [place] [address]   I-Stat arterial blood gas, ED     Status: Abnormal   Collection Time: 07/30/2020  7:57 PM  Result Value Ref Range   pH, Arterial 7.110 (LL) 7.350 - 7.450  pCO2 arterial 75.4 (HH) 32.0 - 48.0 mmHg   pO2, Arterial 57 (L) 83.0 - 108.0 mmHg   Bicarbonate 23.6 20.0 - 28.0 mmol/L   TCO2 26 22 - 32 mmol/L   O2 Saturation 74.0 %   Acid-base deficit 8.0 (H) 0.0 - 2.0 mmol/L   Sodium 139 135 - 145 mmol/L   Potassium 4.1 3.5 - 5.1 mmol/L   Calcium, Ion 1.23 1.15 - 1.40 mmol/L   HCT 49.0 39.0 - 52.0 %   Hemoglobin 16.7 13.0 - 17.0 g/dL   Patient temperature 100.4 F    Collection site art line    Drawn by RT    Sample type ARTERIAL    Comment NOTIFIED PHYSICIAN   Blood culture (routine x 2)     Status: None (Preliminary result)   Collection Time: 07/29/2020  8:42 PM   Specimen: BLOOD  Result Value Ref Range   Specimen Description BLOOD Line, centra    Special Requests      BOTTLES DRAWN AEROBIC AND ANAEROBIC Blood Culture adequate volume   Culture      NO GROWTH < 12 HOURS Performed at [facility], [address] [place], [place] [address]    Report  Status PENDING   ABO/Rh     Status: None   Collection Time: 08/10/20 12:12 AM  Result Value Ref Range   ABO/RH(D)      A POS Performed at [facility] [address], [place], [place] [address]   MRSA PCR Screening     Status: None   Collection Time: 08/10/20 12:12 AM   Specimen: Nasopharyngeal  Result Value Ref Range   MRSA by PCR NEGATIVE NEGATIVE    Comment:        The GeneXpert MRSA Assay (FDA approved for NASAL specimens only), is one component of a comprehensive MRSA colonization surveillance program. It is not intended to diagnose MRSA infection nor to guide or monitor treatment for MRSA infections. Performed at [facility], [place] [address] [place], [place] [address]   I-STAT 7, (LYTES, BLD GAS, ICA, H+H)     Status: Abnormal   Collection Time: 08/10/20 12:40 AM  Result Value Ref Range   pH, Arterial 7.129 (LL) 7.350 - 7.450   pCO2 arterial 77.2 (HH) 32.0 - 48.0 mmHg   pO2, Arterial 91 83.0 - 108.0 mmHg   Bicarbonate 25.1 20.0 - 28.0 mmol/L   TCO2 27 22 - 32 mmol/L   O2 Saturation 92.0 %   Acid-base deficit 6.0 (H) 0.0 - 2.0 mmol/L   Sodium 138 135 - 145 mmol/L   Potassium 5.8 (H) 3.5 - 5.1 mmol/L   Calcium, Ion 1.20 1.15 - 1.40 mmol/L   HCT 51.0 39.0 - 52.0 %   Hemoglobin 17.3 (H) 13.0 - 17.0 g/dL   Patient temperature 38.6 C    Sample type ARTERIAL    Comment VALUES EXPECTED, NO REPEAT   I-STAT 7, (LYTES, BLD GAS, ICA, H+H)     Status: Abnormal   Collection Time: 08/10/20  2:26 AM  Result Value Ref Range   pH, Arterial 7.285 (L) 7.350 - 7.450   pCO2 arterial 42.6 32.0 - 48.0 mmHg   pO2, Arterial 117 (H) 83.0 - 108.0 mmHg   Bicarbonate 20.2 20.0 - 28.0 mmol/L   TCO2 21 (L) 22 - 32 mmol/L   O2 Saturation 98.0 %   Acid-base deficit 6.0 (H) 0.0 - 2.0 mmol/L   Sodium 136 135 - 145 mmol/L  Potassium 6.3 (HH) 3.5 - 5.1 mmol/L   Calcium, Ion 1.13 (L) 1.15 - 1.40 mmol/L   HCT 49.0 39.0 - 52.0 %   Hemoglobin 16.7 13.0 - 17.0 g/dL   Patient  temperature 37.3 C    Sample type ARTERIAL    Comment VALUES EXPECTED, NO REPEAT   I-STAT 7, (LYTES, BLD GAS, ICA, H+H)     Status: Abnormal   Collection Time: 08/10/20  5:12 AM  Result Value Ref Range   pH, Arterial 7.282 (L) 7.350 - 7.450   pCO2 arterial 33.9 32.0 - 48.0 mmHg   pO2, Arterial 73 (L) 83.0 - 108.0 mmHg   Bicarbonate 16.0 (L) 20.0 - 28.0 mmol/L   TCO2 17 (L) 22 - 32 mmol/L   O2 Saturation 93.0 %   Acid-base deficit 10.0 (H) 0.0 - 2.0 mmol/L   Sodium 136 135 - 145 mmol/L   Potassium 5.2 (H) 3.5 - 5.1 mmol/L   Calcium, Ion 1.10 (L) 1.15 - 1.40 mmol/L   HCT 46.0 39.0 - 52.0 %   Hemoglobin 15.6 13.0 - 17.0 g/dL   Patient temperature 36.7 C    Collection site Radial    Drawn by RT    Sample type ARTERIAL   CBC     Status: Abnormal   Collection Time: 08/10/20  5:17 AM  Result Value Ref Range   WBC 18.5 (H) 4.0 - 10.5 K/uL   RBC 5.12 4.22 - 5.81 MIL/uL   Hemoglobin 17.0 13.0 - 17.0 g/dL   HCT 50.4 39.0 - 52.0 %   MCV 98.4 80.0 - 100.0 fL   MCH 33.2 26.0 - 34.0 pg   MCHC 33.7 30.0 - 36.0 g/dL   RDW 12.6 11.5 - 15.5 %   Platelets 195 150 - 400 K/uL   nRBC 0.0 0.0 - 0.2 %    Comment: Performed at [facility], [place] [address], [place], [place] [address]  Basic metabolic panel     Status: Abnormal   Collection Time: 08/10/20  5:17 AM  Result Value Ref Range   Sodium 137 135 - 145 mmol/L   Potassium 5.8 (H) 3.5 - 5.1 mmol/L   Chloride 102 98 - 111 mmol/L   CO2 17 (L) 22 - 32 mmol/L   Glucose, Bld 218 (H) 70 - 99 mg/dL    Comment: Glucose reference range applies only to samples taken after fasting for at least 8 hours.   BUN 23 (H) 6 - 20 mg/dL   Creatinine, Ser 3.24 (H) 0.61 - 1.24 mg/dL    Comment: DELTA CHECK NOTED   Calcium 8.5 (L) 8.9 - 10.3 mg/dL   GFR, Estimated 23 (L) >60 mL/min    Comment: (NOTE) Calculated using the CKD-EPI Creatinine Equation (2021)    Anion gap 18 (H) 5 - 15    Comment: Performed at [facility] [address]  [place], [place] [address]  Magnesium     Status: None   Collection Time: 08/10/20  5:17 AM  Result Value Ref Range   Magnesium 1.8 1.7 - 2.4 mg/dL    Comment: Performed at [facility] [address] [place], [place] [address]  Phosphorus     Status: None   Collection Time: 08/10/20  5:17 AM  Result Value Ref Range   Phosphorus 3.7 2.5 - 4.6 mg/dL    Comment: Performed at [facility] [address] [place], [place] [address]  Triglycerides     Status: Abnormal  Collection Time: 08/10/20  5:17 AM  Result Value Ref Range   Triglycerides 293 (H) <150 mg/dL    Comment: Performed at [facility] [address], [place], [place] [address]  Troponin I (High Sensitivity)     Status: Abnormal   Collection Time: 08/10/20  5:17 AM  Result Value Ref Range   Troponin I (High Sensitivity) 342 (HH) <18 ng/L    Comment: CRITICAL RESULT CALLED TO, READ BACK BY AND VERIFIED WITH: T.SEARLE,RN 0388 08/10/20 CLARK,S (NOTE) Elevated high sensitivity troponin I (hsTnI) values and significant  changes across serial measurements may suggest ACS but many other  chronic and acute conditions are known to elevate hsTnI results.  Refer to the Links section for chest pain algorithms and additional  guidance. Performed at [facility], [place] [address], [place], [place] [address]   Glucose, capillary     Status: Abnormal   Collection Time: 08/10/20  1:45 PM  Result Value Ref Range   Glucose-Capillary 158 (H) 70 - 99 mg/dL    Comment: Glucose reference range applies only to samples taken after fasting for at least 8 hours.  TSH     Status: None   Collection Time: 08/10/20  1:46 PM  Result Value Ref Range   TSH 1.672 0.350 - 4.500 uIU/mL    Comment: Performed by a 3rd Generation assay with a functional sensitivity of <=0.01 uIU/mL. Performed at [facility], [place] [address], [place], [place] [address]   Hepatic function panel     Status: Abnormal   Collection Time: 08/10/20   1:47 PM  Result Value Ref Range   Total Protein 6.7 6.5 - 8.1 g/dL   Albumin 3.2 (L) 3.5 - 5.0 g/dL   AST 64 (H) 15 - 41 U/L   ALT 56 (H) 0 - 44 U/L   Alkaline Phosphatase 55 38 - 126 U/L   Total Bilirubin 0.8 0.3 - 1.2 mg/dL   Bilirubin, Direct <0.1 0.0 - 0.2 mg/dL   Indirect Bilirubin NOT CALCULATED 0.3 - 0.9 mg/dL    Comment: Performed at [facility] [address], [place], [place] [address]  Basic metabolic panel     Status: Abnormal   Collection Time: 08/10/20  1:47 PM  Result Value Ref Range   Sodium 136 135 - 145 mmol/L   Potassium 4.8 3.5 - 5.1 mmol/L    Comment: NO VISIBLE HEMOLYSIS   Chloride 102 98 - 111 mmol/L   CO2 21 (L) 22 - 32 mmol/L   Glucose, Bld 156 (H) 70 - 99 mg/dL    Comment: Glucose reference range applies only to samples taken after fasting for at least 8 hours.   BUN 33 (H) 6 - 20 mg/dL   Creatinine, Ser 4.28 (H) 0.61 - 1.24 mg/dL   Calcium 8.5 (L) 8.9 - 10.3 mg/dL   GFR, Estimated 16 (L) >60 mL/min    Comment: (NOTE) Calculated using the CKD-EPI Creatinine Equation (2021)    Anion gap 13 5 - 15    Comment: Performed at [facility] [address], [place], [place] [address]  Troponin I (High Sensitivity)     Status: Abnormal   Collection Time: 08/10/20  1:47 PM  Result Value Ref Range   Troponin I (High Sensitivity) 390 (HH) <18 ng/L    Comment: CRITICAL VALUE NOTED.  VALUE IS CONSISTENT WITH PREVIOUSLY REPORTED AND CALLED VALUE. (NOTE) Elevated high sensitivity troponin I (hsTnI) values and significant  changes  across serial measurements may suggest ACS but many other  chronic and acute conditions are known to elevate hsTnI results.  Refer to the Links section for chest pain algorithms and additional  guidance. Performed at [facility], [place] [address], [place], [place] [address]   Magnesium     Status: Abnormal   Collection Time: 08/10/20  1:47 PM  Result Value Ref Range   Magnesium 2.8 (H) 1.7 - 2.4 mg/dL    Comment:  Performed at [facility] [address] [place], [place] [address]  Phosphorus     Status: None   Collection Time: 08/10/20  1:47 PM  Result Value Ref Range   Phosphorus 2.6 2.5 - 4.6 mg/dL    Comment: Performed at [facility] [address], [place], [place] [address]  Glucose, capillary     Status: Abnormal   Collection Time: 08/10/20  3:49 PM  Result Value Ref Range   Glucose-Capillary 192 (H) 70 - 99 mg/dL    Comment: Glucose reference range applies only to samples taken after fasting for at least 8 hours.    CT Head Wo Contrast  Result Date: 07/30/2020 CLINICAL DATA:  Cerebral hemorrhage suspected. Witnessed cardiac arrest. EXAM: CT HEAD WITHOUT CONTRAST TECHNIQUE: Contiguous axial images were obtained from the base of the skull through the vertex without intravenous contrast. COMPARISON:  CT head 04/22/2019 FINDINGS: Brain: No evidence of large-territorial acute infarction. No parenchymal hemorrhage. No mass lesion. No extra-axial collection. No mass effect or midline shift. No hydrocephalus. Basilar cisterns are patent. Vascular: No hyperdense vessel. Skull: No acute fracture or focal lesion. Sinuses/Orbits: Bilateral maxillary, ethmoid, sphenoid sinus mucosal thickening. Otherwise the paranasal sinuses and mastoid air cells are clear. The orbits are unremarkable. Other: Subcutaneus soft tissue edema tracking along the face. IMPRESSION: No acute intracranial abnormality. Electronically Signed   By: Iven Finn M.D.   On: 08/14/2020 23:29   CT ANGIO CHEST PE W OR WO CONTRAST  Result Date: 08/17/2020 CLINICAL DATA:  PE suspected, high prob Witnessed cardiac arrest. EXAM: CT ANGIOGRAPHY CHEST WITH CONTRAST TECHNIQUE: Multidetector CT imaging of the chest was performed using the standard protocol during bolus administration of intravenous contrast. Multiplanar CT image reconstructions and MIPs were obtained to evaluate the vascular anatomy. CONTRAST:  13m OMNIPAQUE  IOHEXOL 350 MG/ML SOLN COMPARISON:  Chest radiograph earlier today. Chest CT 09/09/2018 FINDINGS: Cardiovascular: No pulmonary embolus to the segmental level. The subsegmental branches are not well assessed due to contrast bolus timing and likely diminished cardiac perfusion. The thoracic aorta is normal in caliber. Multi chamber cardiomegaly. There is no pericardial effusion. There is contrast refluxing into the hepatic veins and IVC. Mediastinum/Nodes: Extensive pneumomediastinum extending from the thoracic inlet into the upper abdomen. Tracheal tube is present with tip above the clavicles, entry site not included in the field of view. Motion artifact limits assessment for adenopathy, as well as bilateral perihilar consolidations. The included trachea and bronchi are patent. The previous enteric tube entering the right bronchus is been removed. No definite esophageal wall thickening. Lungs/Pleura: Dense symmetric consolidations in both lower lobes, with lesser ground-glass and consolidative opacities in the dependent upper lobes, right middle lobe and lingula. Central air bronchograms. No significant pleural fluid. Upper Abdomen: Pneumomediastinum tracks into the upper abdomen were there is free air anteriorly. Liver appears prominent in size. Significant contrast refluxing into the hepatic veins and IVC below the level of the renal veins. No other acute upper abdominal findings. Musculoskeletal: Subcutaneous  emphysema in the neck. No acute sternal or anterior rib fracture is typically seen with CPR. Diffuse thoracic spondylosis. Review of the MIP images confirms the above findings. IMPRESSION: 1. No pulmonary embolus to the segmental level. The subsegmental branches are not well assessed due to contrast bolus timing and likely diminished cardiac perfusion. 2. Extensive pneumomediastinum extending from the thoracic inlet into the upper abdomen. Subcutaneous emphysema in the neck. 3. Dense symmetric consolidations  in both lower lobes, with lesser ground-glass and consolidative opacities in the dependent upper lobes, right middle lobe, and lingula. Findings most consistent with aspiration. Superimposed pulmonary edema is also considered. 4. Multi chamber cardiomegaly with contrast refluxing into the hepatic veins and IVC consistent with elevated right heart pressures. Electronically Signed   By: Keith Rake M.D.   On: 08/14/2020 23:36   US RENAL  Result Date: 08/10/2020 CLINICAL DATA:  Acute renal injury EXAM: RENAL / URINARY TRACT ULTRASOUND COMPLETE COMPARISON:  None. FINDINGS: Right Kidney: Renal measurements: 14.2 x 6.1 x 6.8 cm. = volume: 309 mL. Echogenicity within normal limits. No mass or hydronephrosis visualized. Left Kidney: Renal measurements: 14.8 x 7.1 x 7.4 cm. = volume: 409 mL. Echogenicity within normal limits. No mass or hydronephrosis visualized. Bladder: Decompressed Other: None. IMPRESSION: Normal appearing kidneys bilaterally. Electronically Signed   By: Inez Catalina M.D.   On: 08/10/2020 12:43   DG Chest Port 1 View  Result Date: 08/10/2020 CLINICAL DATA:  Hypoxia EXAM: PORTABLE CHEST 1 VIEW COMPARISON:  August 09, 2020 chest radiograph and chest CT FINDINGS: Endotracheal tube tip is at the level of T1, 7.9 cm above the carina. Nasogastric tube no longer evident. No appreciable pneumothorax. There is evidence of pneumomediastinum and subcutaneous air. There is airspace opacity bilaterally in the mid and lower lung regions. There is stable cardiac prominence. No adenopathy. No bone lesions. IMPRESSION: Endotracheal tube as described, rather superiorly positioned. No pneumothorax. There is subcutaneous air and pneumomediastinum. Multifocal airspace opacity consistent with multifocal pneumonia noted. Stable cardiac prominence. Electronically Signed   By: Lowella Grip III M.D.   On: 08/10/2020 10:13   DG Chest Portable 1 View  Result Date: 08/13/2020 CLINICAL DATA:  Orogastric tube  placement EXAM: PORTABLE CHEST 1 VIEW COMPARISON:  None. FINDINGS: Tracheostomy noted, slightly higher than would be typically expected, with its tip seen extending to the thoracic inlet just above the level of the clavicular heads. Orogastric tube extends into the a trachea and subsequently into the right lower lobe pulmonary bronchi. There is extensive subcutaneous gas within the neck base bilaterally as well as mild pneumomediastinum present. There is extensive airspace infiltrate within the lungs, right greater than left in keeping with extensive pneumonic infiltrate, pulmonary hemorrhage, or air-filled or pulmonary edema in the acute setting. No pneumothorax or pleural effusion. Cardiac size within normal limits. No acute bone abnormality. IMPRESSION: Tracheostomy in place, slightly higher than would be typically expected. Orogastric tube demonstrates tracheal intubation and extends into the right lower lobe pulmonary bronchi. Extensive subcutaneous gas at the neck base and pneumomediastinum. This may relate to either emergent tracheostomy placement or perforation of the tracheobronchial tree secondary to orogastric tube malposition. This could be further assessed with CT imaging. Extensive bilateral airspace infiltrate, infection versus edema versus hemorrhage. Attempts are being made to contact the managing physician for direct communication at this time. Electronically Signed   By: Fidela Salisbury MD   On: 08/18/2020 19:13   EEG adult  Result Date: 08/10/2020 Lora Havens, MD  08/10/2020  1:57 PM Patient Name: Tyler [name] MRN: [id number] Epilepsy Attending: Lora Havens Referring Physician/Provider: Elease Etienne, PA Date: 08/10/2020 Duration: 26.31 mins Patient history: 49yo M s/p cardiac arrest. EEG to evaluate for seizure Level of alertness: comatose AEDs during EEG study: Propofol Technical aspects: This EEG study was done with scalp electrodes positioned according to the 10-20  International system of electrode placement. Electrical activity was acquired at a sampling rate of '500Hz'  and reviewed with a high frequency filter of '70Hz'  and a low frequency filter of '1Hz' . EEG data were recorded continuously and digitally stored. Description: EEG showed continuous generalized 2-3 Hz delta slowing. Generalized periodic discharges with triphasic morphology at 0.75-'1Hz'  were also noted. EEG was reactive to noxious stimulation.  Hyperventilation and photic stimulation were not performed.   ABNORMALITY -Continuous slow, generalized -Periodic discharges with triphasic morphology, generalized IMPRESSION: This study is suggestive of severe diffuse encephalopathy, nonspecific etiology but likely related to sedation,  anoxic/hypoxic brain injury. Periodic discharges with triphasic morphology were also noted which can be on the ictal-interictal continuum and are most likely secondary to underlying anoxic-hypoxic injury. No seizures were seen throughout the recording. Lora Havens   ECHOCARDIOGRAM COMPLETE  Result Date: 08/10/2020    ECHOCARDIOGRAM REPORT   Patient Name:   Shreyansh AZEL GUMINA Date of Exam: 08/10/2020 Medical Rec #:  [id number]      Height:       68.0 in Accession #:    [id number]     Weight:       276.2 lb Date of Birth:  10/21/1970      BSA:          2.344 m Patient Age:    58 years       BP:           112/77 mmHg Patient Gender: M              HR:           80 bpm. Exam Location:  Inpatient Procedure: 2D Echo, Cardiac Doppler, Color Doppler and Intracardiac            Opacification Agent Indications:    Cardiac arrest I46.9  History:        Patient has no prior history of Echocardiogram examinations.  Sonographer:    Vickie Epley RDCS Referring Phys: [id number] Francesca Jewett  Sonographer Comments: Technically challenging study due to limited acoustic windows and echo performed with patient supine and on artificial respirator. Image acquisition challenging due to patient body habitus.  IMPRESSIONS  1. Left ventricular ejection fraction, by estimation, is Not well visualized%. The left ventricle has not well visualized function. Left ventricular endocardial border not optimally defined to evaluate regional wall motion. The left ventricular internal  cavity size was not well visualized. There is not well visualized left ventricular hypertrophy. Left ventricular diastolic function could not be evaluated.  2. Right ventricular systolic function was not well visualized. The right ventricular size is not well visualized.  3. The mitral valve is rheumatic. No evidence of mitral valve regurgitation. No evidence of mitral stenosis.  4. Tricuspid valve regurgitation not well visualized.  5. The aortic valve was not well visualized. Aortic valve regurgitation not well visualized.  6. The inferior vena cava is dilated in size with <50% respiratory variability, suggesting right atrial pressure of 15 mmHg. Comparison(s): No prior Echocardiogram. Conclusion(s)/Recommendation(s): Poor windows for evaluation of left ventricular function by transthoracic echocardiography. Would recommend an alternative means of evaluation.  Very limited images, poor windows and no visualization with echo contrast. Cannot assess LV function, though appears abnormal on limited views. Recommend alternative evaluation. FINDINGS  Left Ventricle: Left ventricular ejection fraction, by estimation, is Not well visualized%. The left ventricle has not well visualized function. Left ventricular endocardial border not optimally defined to evaluate regional wall motion. Definity contrast agent was given IV to delineate the left ventricular endocardial borders. The left ventricular internal cavity size was not well visualized. There is not well visualized left ventricular hypertrophy. Left ventricular diastolic function could not  be evaluated. Right Ventricle: The right ventricular size is not well visualized. Right vetricular wall thickness was  not well visualized. Right ventricular systolic function was not well visualized. Left Atrium: Left atrial size was not well visualized. Right Atrium: Right atrial size was not well visualized. Pericardium: The pericardium was not well visualized. Mitral Valve: The mitral valve is rheumatic. There is moderate calcification of the mitral valve leaflet(s). No evidence of mitral valve regurgitation. No evidence of mitral valve stenosis. Tricuspid Valve: The tricuspid valve is not well visualized. Tricuspid valve regurgitation not well visualized. Aortic Valve: The aortic valve was not well visualized. Aortic valve regurgitation not well visualized. Pulmonic Valve: The pulmonic valve was not well visualized. Pulmonic valve regurgitation is not visualized. Aorta: The ascending aorta was not well visualized and the aortic root was not well visualized. Venous: The inferior vena cava is dilated in size with less than 50% respiratory variability, suggesting right atrial pressure of 15 mmHg. IAS/Shunts: The atrial septum is grossly normal.   Diastology LV e' medial:    5.78 cm/s LV E/e' medial:  7.8 LV e' lateral:   6.31 cm/s LV E/e' lateral: 7.1  AORTIC VALVE LVOT Vmax:   68.40 cm/s LVOT Vmean:  48.700 cm/s LVOT VTI:    0.135 m  AORTA Ao Desc diam: 2.30 cm MITRAL VALVE MV Area (PHT): 3.99 cm    SHUNTS MV Decel Time: 190 msec    Systemic VTI: 0.14 m MV E velocity: 45.00 cm/s MV A velocity: 54.00 cm/s MV E/A ratio:  0.83 Buford Dresser MD Electronically signed by Buford Dresser MD Signature Date/Time: 08/10/2020/2:26:09 PM    Final    Review of System Unable to obtain.  Blood pressure (!) 134/96, pulse 83, temperature 98.4 F (36.9 C), resp. rate (!) 30, height '5\' 8"'  (1.727 m), weight 125.3 kg, SpO2 100 %. General appearance: Comatose, unresponsive. Head: Normocephalic, without obvious abnormality, atraumatic Ears: Examination of the ears shows normal auricles and external auditory canals bilaterally.    Nose: Nasal examination shows normal mucosa, septum, turbinates.  Face: Facial examination shows no asymmetry.  Mouth: Oral cavity examination shows no mucosal lacerations.  Neck: cricothyrotomy tube in place, sating 98% on vent. Neuro: Comatose, not responsive.  Assessment/Plan: Pt with respiratory failure, s/p cardiac arrest and PEA. - Currently with a cricithyrotomy tube in place. - Plan to revise his airway to a tracheostomy tube. Surgery plan to tomorrow. - Informed consent obtained from the daughter.  Yoselyn Mcglade W Laquanna Veazey 08/10/2020, 5:02 PM

## 2020-08-10 NOTE — Progress Notes (Signed)
eLink Physician-Brief Progress Note Patient Name: Tyler [name] DOB: [birth year] MRN: [id number]   Date of Service  08/10/2020  HPI/Events of Note  AbG reviewed, improved with recent ventilator changes. Patient needs order for Norepinephrine infusion.  eICU Interventions  Norepinephrine infusion ordered.        Thomasene Lot Ilynn Stauffer 08/10/2020, 2:49 AM

## 2020-08-10 NOTE — Progress Notes (Signed)
Initial Nutrition Assessment  DOCUMENTATION CODES:   Obesity unspecified  INTERVENTION:   Tube Feeding via Cortrak: Vital AF 1.2 at 55 ml/hr Pro-Source 45 mL TID Provides 1704 kcals, 132 g of protein and 1069 mL of free water Monitor renal function and electrolytes, plan to make adjustments as needed  TF regimen and propofol at current rate providing 2008 total kcal/day   NUTRITION DIAGNOSIS:   Inadequate oral intake related to dysphagia,acute illness as evidenced by NPO status,per patient/family report.  GOAL:   Patient Tyler [name] meet greater than or equal to 90% of their needs  MONITOR:   Vent status,TF tolerance,Weight trends,Labs  REASON FOR ASSESSMENT:   Ventilator    ASSESSMENT:   49 yo male admitted post witness out of hospital PEA arrest with difficulty airway requiring emergent cricothyroidotomy, AKI .PMH includes esophageal stricture with food impaction, GERD, PTSD   TTM normothermia Pt sedated on vent support, ENT consult to revise cricothyroidotomy to trach per CCM Propofol: 11.5 ml/hr  Cortrak placed today. OG tube inserted on admission but entered trachea with tip terminating in the right upper lobe and was subsequently removed  Current wt 125 kg; no previous weight encounters. Unable to obtain diet and weight history from pt at this time.   Per report, pt was not eating well PTA as he was getting choked on food. Recommend addressing swallowing issues post extubation, hx of esophageal strictures.  AKI, currenly anuric and hyperkalemic  Labs: potassium 5.8 (H), Creatinine 3.24 Meds: thiamine, MVI, miralax and colace, sodium bicarb gtt   Diet Order:   Diet Order    None      EDUCATION NEEDS:   Not appropriate for education at this time  Skin:  Skin Assessment: Reviewed RN Assessment  Last BM:  PTA  Height:   Ht Readings from Last 1 Encounters:  08/08/2020 5\' 8"  (1.727 m)    Weight:   Wt Readings from Last 1 Encounters:  08/10/20 125.3  kg    BMI:  Body mass index is 42 kg/m.  Estimated Nutritional Needs:   Kcal:  1750-1950 kcals  Protein:  125-160 g  Fluid:  >/= 2 L   08/12/20 MS, RDN, LDN, CNSC Registered Dietitian III Clinical Nutrition RD Pager and On-Call Pager Number Located in [place]

## 2020-08-10 NOTE — Progress Notes (Signed)
NAME:  Tyler MeyerWill B Iyengar, MRN:  [id number], DOB:  08/05/1971, LOS: 1 ADMISSION DATE:  08/01/2020, CONSULTATION DATE:  08/10/20 REFERRING MD:  EDP, CHIEF COMPLAINT: Cardiac Arrest  Brief History:  49 year old male with history of GERD, PTSD, esophageal stricture and food impaction who presents with witnessed out of hospital PEA arrest that occurred while lifting at heavy object overhead at work.  Unknown total downtime prior to hospitalization, but patient was difficult to bag with BVM/ king airway and required emergent cricothyroidotomy by EMS.  PCCM consulted for admission  History of Present Illness:  Candis SchatzWilliam [name] is a 49 year old male with past medical history of obesity, GERD, PTSD, distal esophageal stenosis with history of food impaction and requiring dilation in 08/2018, per care everywhere records in duplicate chart who presents with witnessed, out of hospital PEA arrest.   Per report from family and EMS, patient was at work and was lifting something over his head when he got a blank stare on his face and then collapsed.  Bystander CPR was immediately started, when EMS arrived for around ACLS were required.  A King airway was placed, but was subsequently difficult to bag and an emergent cricothyrotomy was performed.  [facility] he lost pulses again and required another 10 minutes of ACLS.  He has not been responsive.  EKG showed atrial fibrillation with RVR without signs of ischemia.  Patient's fianc and daughter at the bedside, they report that he had "not been feeling well" recently with some acid reflux and upper stomach/chest discomfort.  They deny any history of hypertension, cardiac disease, diabetes, recent illness or fever.  Labs in the ED were significant for an acute kidney injury with creatinine of 1.7, glucose greater than 300, lactic acid 6.8, Covid-19 and influenza negative.  Chest x-ray with extensive airspace infiltrates.  He required low-dose Levophed for  hypotension and has been unresponsive since arrival.  PCCM consulted for admission.   Past Medical History:  GERD, esophageal stenosis, PTSD, obesity  Significant Hospital Events:  12/16 admit to critical care  Consults:  ENT - Dr. Suszanne Connerseoh 12/17  Procedures:  12/16 emergent cric by EMS 12/16 right femoral CVC in ED  Significant Diagnostic Tests:  12/16 CXR>>Extensive subcutaneous gas at the neck base and pneumomediastinum. This may relate to either emergent tracheostomy placement or perforation of the tracheobronchial tree secondary to orogastric tube malposition. Extensive bilateral airspace infiltrate, infection versus edema versus hemorrhage.  12/16 CT head>> No acute intracranial abnormality. 12/16 CT chest>> 1. No pulmonary embolus to the segmental level. The subsegmental branches are not well assessed due to contrast bolus timing and likely diminished cardiac perfusion. 2. Extensive pneumomediastinum extending from the thoracic inlet into the upper abdomen. Subcutaneous emphysema in the neck. 3. Dense symmetric consolidations in both lower lobes, with lesser ground-glass and consolidative opacities in the dependent upper lobes, right middle lobe, and lingula. Findings most consistent with aspiration. Superimposed pulmonary edema is also considered. 4. Multi chamber cardiomegaly with contrast refluxing into the hepatic veins and IVC consistent with elevated right heart Pressures.  12/17 TTE >> 12/17 EEG >> 12/17 renal ultrasound >>  Micro Data:  12/16 SARS-Covid-2, influenza>> 12/16 blood cultures x2>> 12/17 UC >>   Antimicrobials:  12/16 vanc 12/16 zosyn  12/16 unasyn  >>  Interim History / Subjective:  101.7 tmax Worsening renal function/ 75 ml output since admit Remains on amio gtt, back in NSR Off NE Remains on propofol and fentanyl and normothermia protocol  RN reports unable  to pass OGT  Objective   Blood pressure (!) 134/96, pulse 83, temperature 98.4  F (36.9 C), resp. rate (!) 30, height 5\' 8"  (1.727 m), weight 125.3 kg, SpO2 100 %.    Vent Mode: PRVC FiO2 (%):  [100 %] 100 % Set Rate:  [18 bmp-30 bmp] 30 bmp Vt Set:  [540 mL-550 mL] 540 mL PEEP:  [8 cmH20-12 cmH20] 12 cmH20 Plateau Pressure:  [33 cmH20] 33 cmH20   Intake/Output Summary (Last 24 hours) at 08/10/2020 0936 Last data filed at 08/10/2020 08/12/2020 Gross per 24 hour  Intake 623.31 ml  Output 75 ml  Net 548.31 ml   Filed Weights   2020/08/31 1807 08/10/20 0000  Weight: 125 kg 125.3 kg   General: Critically ill obese male in NAD HEENT: MM pink/moist, pupils 3/reactive, anicteric, midline cric w/small ETT with commercial tube holder in place Neuro: sedated, unresponsive.  Small rhythmic movements of mouth noted CV: rr, distant, NSR PULM:  Non labored on MV, CTA GI: obese, limited assessment with cooling pads, +bs, foley- 15 ml cyu Extremities: warm/dry Skin: no rashes, multiple tattoos   Resolved Hospital Problem list     Assessment & Plan:   Witnessed, out of hospital PEA arrest with subsequent respiratory failure and emergent tracheostomy Unclear downtime, but at least for rounds of ACLS by EMS and 10 minutes CPR in the ED with initial [facility] airway placed with subsequent resistance to bagging and emergent trach placed by EMS.   Per fianc, patient has a reported history of passing out but she cannot give further history.  Denies any known arrhythmia.   P: Etiology remains unclear.  Noted to have +ETOH at 55.  UDS neg.  Ongoing tele monitoring Doubt ACS given non acute EKG/ Troponin 28- 86- 342 Recheck troponin again Pending TTE and EEG Off vasopressors, goal MAP > 65 Adding prn apresoline if needed  Checking TSH/ ammonia  Trend LFTs Ongoing TTM - normothermia as below   Acute hypoxic respiratory failure secondary to cardiac arrest  Difficult airway s/p emergent cricothyroidotomy Aspiration PNA  Pneumomediastinum - suspect secondary to emergent cric P:   Full MV support, PRVC 8cc, rate 30 Wean FiO2/ PEEP as able for sat goal > 92 CXR now and intermittent CXR/ ABG VAP/ PPI  ENT consulted for revision to trach Continue unasyn for 7 days   Afib with RVR  P:  Stop amio gtt for now as he in back in NSR, Mical continue to monitor tele Hold on systemic anticoagulation for now  Acute encephalopathy Concern for hypoxic ischemic injury given prolonged cardiac arrest/ hypoxia  - ETOH on admit 55 P: Continue normothermia protocol  CT head neg EEG pending- on clinical exam, ?starting of myoclonus in lips  Consider MRI after airway stabilized  Neuro prognostication at 96 hours Adding thiamine/ folic acid  PAD protocol with propofol/ fentanyl   Acute kidney injury, nearly anuric, suspect ATN from prolonged hypotension AGMA/ lactic acidosis  Mild hyperkalemia  Baseline creatinine~1 in care everywhere P: Continue foley  Renal ultrasound to r/o obstruction and urine lytes  Given no enteral access, Madyx do IV insulin, dextrose, and add bicarb gtt at 75 ml/hr Hold on calcium gluconate given no EKG changes Recheck BMET at 1600  If renal function continues to worsen, offering advanced therapies would be dependent on neurological recovery   Hyperglycemia- stress induced P:  HA1c 5.9 Adding SSI sensitive   Hx of esophageal stricture - bedside unable to pass OGT P:  Joy have  cortrak team attempt cortrak placement   Best practice (evaluated daily)  Diet: NPO Pain/Anxiety/Delirium protocol (if indicated): Fentanyl/ propofol VAP protocol (if indicated): Head of bed 30 degrees, suction as needed DVT prophylaxis: SCDs/ heparin  GI prophylaxis: Protonix Glucose control: Sliding scale sensitive  Mobility: Bedrest Disposition: ICU  Goals of Care:  Last date of multidisciplinary goals of care discussion: Initial goals of care discussion on admission with patient's fianc and daughter, aggressive care and full code. Family and staff present:  Nursing and patient's fianc and daughter Summary of discussion: Full code Follow up goals of care discussion due: 12/23 Code Status: Full code  -pending family update 12/17  Labs   CBC: Recent Labs  Lab 07/25/2020 1805 08/08/2020 1821 08/18/2020 1957 08/10/20 0040 08/10/20 0226 08/10/20 0512 08/10/20 0517  WBC 17.0*  --   --   --   --   --  18.5*  NEUTROABS 11.6*  --   --   --   --   --   --   HGB 16.7   < > 16.7 17.3* 16.7 15.6 17.0  HCT 50.2   < > 49.0 51.0 49.0 46.0 50.4  MCV 101.6*  --   --   --   --   --  98.4  PLT 289  --   --   --   --   --  195   < > = values in this interval not displayed.    Basic Metabolic Panel: Recent Labs  Lab 08/07/2020 1805 08/19/2020 1821 08/08/2020 1940 07/27/2020 1957 08/10/20 0040 08/10/20 0226 08/10/20 0512 08/10/20 0517  NA 140 139  --  139 138 136 136 137  K 4.0 4.1  --  4.1 5.8* 6.3* 5.2* 5.8*  CL 102 104  --   --   --   --   --  102  CO2 22  --   --   --   --   --   --  17*  GLUCOSE 310* 299*  --   --   --   --   --  218*  BUN 11 16  --   --   --   --   --  23*  CREATININE 1.79* 1.80*  --   --   --   --   --  3.24*  CALCIUM 8.7*  --   --   --   --   --   --  8.5*  MG  --   --  2.2  --   --   --   --  1.8  PHOS  --   --   --   --   --   --   --  3.7   GFR: Estimated Creatinine Clearance: 35.6 mL/min (A) (by C-G formula based on SCr of 3.24 mg/dL (H)). Recent Labs  Lab 08/14/2020 1805 07/31/2020 1953 08/10/20 0517  WBC 17.0*  --  18.5*  LATICACIDVEN 6.8* 4.1*  --     Liver Function Tests: Recent Labs  Lab 08/06/2020 1805  AST 104*  ALT 55*  ALKPHOS 83  BILITOT 0.9  PROT 6.4*  ALBUMIN 3.2*   No results for input(s): LIPASE, AMYLASE in the last 168 hours. No results for input(s): AMMONIA in the last 168 hours.  ABG    Component Value Date/Time   PHART 7.282 (L) 08/10/2020 0512   PCO2ART 33.9 08/10/2020 0512   PO2ART 73 (L) 08/10/2020 0512   HCO3 16.0 (L) 08/10/2020 6063  TCO2 17 (L) 08/10/2020 0512   ACIDBASEDEF  10.0 (H) 08/10/2020 0512   O2SAT 93.0 08/10/2020 0512     Coagulation Profile: Recent Labs  Lab 08/15/20 1805  INR 1.1    Cardiac Enzymes: No results for input(s): CKTOTAL, CKMB, CKMBINDEX, TROPONINI in the last 168 hours.  HbA1C: Hgb A1c MFr Bld  Date/Time Value Ref Range Status  08/15/20 06:05 PM 5.9 (H) 4.8 - 5.6 % Final    Comment:    (NOTE) Pre diabetes:          5.7%-6.4%  Diabetes:              >6.4%  Glycemic control for   <7.0% adults with diabetes     CBG: No results for input(s): GLUCAP in the last 168 hours.   Critical care time: 55 minutes     Posey Boyer, ACNP [facility] 08/10/2020, 9:36 AM

## 2020-08-10 NOTE — Progress Notes (Signed)
eLink Physician-Brief Progress Note Patient Name: Tyler [name] DOB: [birth year] MRN: [id number]   Date of Service  08/10/2020  HPI/Events of Note  Hypercapnia with respiratory acidosis on PRVC.  eICU Interventions  Allan trial Pressure control        Meghen Akopyan U Quilla Freeze 08/10/2020, 1:24 AM

## 2020-08-11 ENCOUNTER — Inpatient Hospital Stay (HOSPITAL_COMMUNITY): Payer: Self-pay | Admitting: Anesthesiology

## 2020-08-11 ENCOUNTER — Encounter (HOSPITAL_COMMUNITY): Admission: EM | Disposition: E | Payer: Self-pay | Source: Home / Self Care | Attending: Pulmonary Disease

## 2020-08-11 ENCOUNTER — Inpatient Hospital Stay (HOSPITAL_COMMUNITY): Payer: Self-pay

## 2020-08-11 HISTORY — PX: TRACHEOSTOMY TUBE PLACEMENT: SHX814

## 2020-08-11 LAB — MAGNESIUM
Magnesium: 2.4 mg/dL (ref 1.7–2.4)
Magnesium: 2.3 mg/dL (ref 1.7–2.4)

## 2020-08-11 LAB — POCT I-STAT 7, (LYTES, BLD GAS, ICA,H+H)
Acid-base deficit: 2 mmol/L (ref 0.0–2.0)
Bicarbonate: 21.5 mmol/L (ref 20.0–28.0)
Calcium, Ion: 1.11 mmol/L — ABNORMAL LOW (ref 1.15–1.40)
HCT: 37 % — ABNORMAL LOW (ref 39.0–52.0)
Hemoglobin: 12.6 g/dL — ABNORMAL LOW (ref 13.0–17.0)
O2 Saturation: 97 %
Patient temperature: 37.1
Potassium: 3.5 mmol/L (ref 3.5–5.1)
Sodium: 136 mmol/L (ref 135–145)
TCO2: 22 mmol/L (ref 22–32)
pCO2 arterial: 32.8 mmHg (ref 32.0–48.0)
pH, Arterial: 7.425 (ref 7.350–7.450)
pO2, Arterial: 85 mmHg (ref 83.0–108.0)

## 2020-08-11 LAB — CBC
HCT: 37.4 % — ABNORMAL LOW (ref 39.0–52.0)
Hemoglobin: 13.6 g/dL (ref 13.0–17.0)
MCH: 34.6 pg — ABNORMAL HIGH (ref 26.0–34.0)
MCHC: 36.4 g/dL — ABNORMAL HIGH (ref 30.0–36.0)
MCV: 95.2 fL (ref 80.0–100.0)
Platelets: 125 10*3/uL — ABNORMAL LOW (ref 150–400)
RBC: 3.93 MIL/uL — ABNORMAL LOW (ref 4.22–5.81)
RDW: 12.7 % (ref 11.5–15.5)
WBC: 11.1 10*3/uL — ABNORMAL HIGH (ref 4.0–10.5)
nRBC: 0 % (ref 0.0–0.2)

## 2020-08-11 LAB — GLUCOSE, CAPILLARY
Glucose-Capillary: 105 mg/dL — ABNORMAL HIGH (ref 70–99)
Glucose-Capillary: 124 mg/dL — ABNORMAL HIGH (ref 70–99)
Glucose-Capillary: 133 mg/dL — ABNORMAL HIGH (ref 70–99)
Glucose-Capillary: 139 mg/dL — ABNORMAL HIGH (ref 70–99)
Glucose-Capillary: 83 mg/dL (ref 70–99)
Glucose-Capillary: 91 mg/dL (ref 70–99)

## 2020-08-11 LAB — HEPATIC FUNCTION PANEL
ALT: 40 U/L (ref 0–44)
AST: 46 U/L — ABNORMAL HIGH (ref 15–41)
Albumin: 2.8 g/dL — ABNORMAL LOW (ref 3.5–5.0)
Alkaline Phosphatase: 44 U/L (ref 38–126)
Bilirubin, Direct: 0.2 mg/dL (ref 0.0–0.2)
Indirect Bilirubin: 0.6 mg/dL (ref 0.3–0.9)
Total Bilirubin: 0.8 mg/dL (ref 0.3–1.2)
Total Protein: 5.7 g/dL — ABNORMAL LOW (ref 6.5–8.1)

## 2020-08-11 LAB — BASIC METABOLIC PANEL
Anion gap: 17 — ABNORMAL HIGH (ref 5–15)
Anion gap: 17 — ABNORMAL HIGH (ref 5–15)
BUN: 53 mg/dL — ABNORMAL HIGH (ref 6–20)
BUN: 62 mg/dL — ABNORMAL HIGH (ref 6–20)
CO2: 19 mmol/L — ABNORMAL LOW (ref 22–32)
CO2: 23 mmol/L (ref 22–32)
Calcium: 8.2 mg/dL — ABNORMAL LOW (ref 8.9–10.3)
Calcium: 8 mg/dL — ABNORMAL LOW (ref 8.9–10.3)
Chloride: 99 mmol/L (ref 98–111)
Chloride: 95 mmol/L — ABNORMAL LOW (ref 98–111)
Creatinine, Ser: 5.5 mg/dL — ABNORMAL HIGH (ref 0.61–1.24)
Creatinine, Ser: 6.84 mg/dL — ABNORMAL HIGH (ref 0.61–1.24)
GFR, Estimated: 12 mL/min — ABNORMAL LOW (ref >60)
GFR, Estimated: 9 mL/min — ABNORMAL LOW (ref >60)
Glucose, Bld: 146 mg/dL — ABNORMAL HIGH (ref 70–99)
Glucose, Bld: 110 mg/dL — ABNORMAL HIGH (ref 70–99)
Potassium: 4.1 mmol/L (ref 3.5–5.1)
Potassium: 3.7 mmol/L (ref 3.5–5.1)
Sodium: 135 mmol/L (ref 135–145)
Sodium: 135 mmol/L (ref 135–145)

## 2020-08-11 LAB — PHOSPHORUS
Phosphorus: 3.3 mg/dL (ref 2.5–4.6)
Phosphorus: 5.6 mg/dL — ABNORMAL HIGH (ref 2.5–4.6)

## 2020-08-11 LAB — TRIGLYCERIDES: Triglycerides: 603 mg/dL — ABNORMAL HIGH (ref <150)

## 2020-08-11 SURGERY — CREATION, TRACHEOSTOMY
Anesthesia: General | Site: Neck

## 2020-08-11 MED ORDER — LIDOCAINE-EPINEPHRINE 1 %-1:100000 IJ SOLN
INTRAMUSCULAR | Status: DC | PRN
  Administered 2020-08-11: 4 mL

## 2020-08-11 MED ORDER — MIDAZOLAM HCL 5 MG/5ML IJ SOLN
INTRAMUSCULAR | Status: DC | PRN
  Administered 2020-08-11: 2 mg via INTRAVENOUS

## 2020-08-11 MED ORDER — ROCURONIUM BROMIDE 10 MG/ML (PF) SYRINGE
PREFILLED_SYRINGE | INTRAVENOUS | Status: DC | PRN
  Administered 2020-08-11: 30 mg via INTRAVENOUS
  Administered 2020-08-11: 50 mg via INTRAVENOUS

## 2020-08-11 MED ORDER — 0.9 % SODIUM CHLORIDE (POUR BTL) OPTIME
TOPICAL | Status: DC | PRN
Start: 2020-08-11 — End: 2020-08-11
  Administered 2020-08-11: 1000 mL

## 2020-08-11 MED ORDER — FENTANYL CITRATE (PF) 250 MCG/5ML IJ SOLN
INTRAMUSCULAR | Status: AC
  Filled 2020-08-11: qty 5

## 2020-08-11 MED ORDER — VECURONIUM BROMIDE 10 MG IV SOLR
10.0000 mg | Freq: Once | INTRAVENOUS | Status: AC
  Administered 2020-08-11: 10 mg via INTRAVENOUS
  Filled 2020-08-11: qty 10

## 2020-08-11 MED ORDER — MIDAZOLAM HCL 2 MG/2ML IJ SOLN
2.0000 mg | INTRAMUSCULAR | Status: DC | PRN
  Administered 2020-08-11 – 2020-08-12 (×5): 2 mg via INTRAVENOUS
  Filled 2020-08-11 (×4): qty 2

## 2020-08-11 MED ORDER — LACTATED RINGERS IV SOLN: INTRAVENOUS | Status: DC | PRN

## 2020-08-11 MED ORDER — NICARDIPINE HCL IN NACL 20-0.86 MG/200ML-% IV SOLN
3.0000 mg/h | INTRAVENOUS | Status: DC
  Administered 2020-08-12: 3 mg/h via INTRAVENOUS
  Administered 2020-08-12 (×2): 5 mg/h via INTRAVENOUS
  Filled 2020-08-11 (×5): qty 200

## 2020-08-11 MED ORDER — HEMOSTATIC AGENTS (NO CHARGE) OPTIME
TOPICAL | Status: DC | PRN
  Administered 2020-08-11: 1 via TOPICAL

## 2020-08-11 MED ORDER — MAGNESIUM SULFATE 2 GM/50ML IV SOLN
2.0000 g | Freq: Once | INTRAVENOUS | Status: AC
  Administered 2020-08-11: 2 g via INTRAVENOUS
  Filled 2020-08-11: qty 50

## 2020-08-11 MED ORDER — LIDOCAINE-EPINEPHRINE 1 %-1:100000 IJ SOLN
INTRAMUSCULAR | Status: AC
  Filled 2020-08-11: qty 1

## 2020-08-11 MED ORDER — VECURONIUM BROMIDE 10 MG IV SOLR
10.0000 mg | INTRAVENOUS | Status: AC
  Administered 2020-08-11: 10 mg via INTRAVENOUS
  Filled 2020-08-11: qty 10

## 2020-08-11 MED ORDER — MIDAZOLAM HCL 2 MG/2ML IJ SOLN
INTRAMUSCULAR | Status: AC
  Filled 2020-08-11: qty 2

## 2020-08-11 SURGICAL SUPPLY — 40 items
BLADE CLIPPER SURG (BLADE) IMPLANT
BLADE SURG 15 STRL LF DISP TIS (BLADE) IMPLANT
BLADE SURG 15 STRL SS (BLADE)
CANISTER SUCT 3000ML PPV (MISCELLANEOUS) ×2 IMPLANT
CLEANER TIP ELECTROSURG 2X2 (MISCELLANEOUS) ×2 IMPLANT
COVER SURGICAL LIGHT HANDLE (MISCELLANEOUS) ×2 IMPLANT
COVER WAND RF STERILE (DRAPES) IMPLANT
DECANTER SPIKE VIAL GLASS SM (MISCELLANEOUS) ×2 IMPLANT
DRAPE HALF SHEET 40X57 (DRAPES) IMPLANT
ELECT COATED BLADE 2.86 ST (ELECTRODE) ×2 IMPLANT
ELECT REM PT RETURN 9FT ADLT (ELECTROSURGICAL) ×2
ELECTRODE REM PT RTRN 9FT ADLT (ELECTROSURGICAL) ×1 IMPLANT
GAUZE 4X4 16PLY RFD (DISPOSABLE) IMPLANT
GAUZE SPONGE 4X4 12PLY STRL (GAUZE/BANDAGES/DRESSINGS) ×2 IMPLANT
GAUZE XEROFORM 5X9 LF (GAUZE/BANDAGES/DRESSINGS) IMPLANT
GLOVE ECLIPSE 7.5 STRL STRAW (GLOVE) ×2 IMPLANT
GOWN STRL REUS W/ TWL LRG LVL3 (GOWN DISPOSABLE) ×2 IMPLANT
GOWN STRL REUS W/TWL LRG LVL3 (GOWN DISPOSABLE) ×4
HEMOSTAT SNOW SURGICEL 2X4 (HEMOSTASIS) ×2 IMPLANT
HOLDER TRACH TUBE VELCRO 19.5 (MISCELLANEOUS) ×2 IMPLANT
KIT BASIN OR (CUSTOM PROCEDURE TRAY) ×2 IMPLANT
KIT SUCTION CATH 14FR (SUCTIONS) IMPLANT
KIT TURNOVER KIT B (KITS) ×2 IMPLANT
NEEDLE HYPO 25GX1X1/2 BEV (NEEDLE) ×2 IMPLANT
NS IRRIG 1000ML POUR BTL (IV SOLUTION) ×2 IMPLANT
PAD ARMBOARD 7.5X6 YLW CONV (MISCELLANEOUS) ×4 IMPLANT
PENCIL SMOKE EVACUATOR (MISCELLANEOUS) IMPLANT
SPONGE INTESTINAL PEANUT (DISPOSABLE) ×2 IMPLANT
SUT CHROMIC 3 0 SH 27 (SUTURE) IMPLANT
SUT PROLENE 2 0 SH DA (SUTURE) ×2 IMPLANT
SUT SILK 3 0 (SUTURE) ×2
SUT SILK 3-0 18XBRD TIE 12 (SUTURE) ×1 IMPLANT
SYR 20ML LL LF (SYRINGE) IMPLANT
SYR BULB EAR ULCER 3OZ GRN STR (SYRINGE) IMPLANT
SYR CONTROL 10ML LL (SYRINGE) ×2 IMPLANT
TOWEL GREEN STERILE FF (TOWEL DISPOSABLE) ×2 IMPLANT
TRAY ENT MC OR (CUSTOM PROCEDURE TRAY) ×2 IMPLANT
TUBE CONNECTING 12X1/4 (SUCTIONS) IMPLANT
TUBE TRACH SHILEY 8 DIST CUF (TUBING) ×2 IMPLANT
WATER STERILE IRR 1000ML POUR (IV SOLUTION) ×2 IMPLANT

## 2020-08-11 NOTE — Anesthesia Postprocedure Evaluation (Signed)
Anesthesia Post Note  Patient: Tyler [name]  Procedure(s) Performed: TRACHEOSTOMY (N/A Neck)     Patient location during evaluation: SICU Anesthesia Type: General Level of consciousness: sedated and patient remains intubated per anesthesia plan Pain management: pain level controlled Vital Signs Assessment: post-procedure vital signs reviewed and stable Respiratory status: spontaneous breathing and patient on ventilator - see flowsheet for VS Cardiovascular status: stable Anesthetic complications: no   No complications documented.  Last Vitals:  Vitals:   07/29/2020 2115 08/04/2020 2130  BP:    Pulse: (!) 138 (!) 34  Resp: (!) 30 (!) 31  Temp:    SpO2: 90% 91%    Last Pain:  Vitals:   08/10/2020 0813  TempSrc: Core  PainSc:                  Tyler [name]

## 2020-08-11 NOTE — Anesthesia Procedure Notes (Signed)
Procedure Name: Intubation Date/Time: 07/29/2020 1:06 PM Performed by: Macie Burows, CRNA Pre-anesthesia Checklist: Patient identified, Emergency Drugs available, Suction available and Patient being monitored Patient Re-evaluated:Patient Re-evaluated prior to induction Oxygen Delivery Method: Circle system utilized Preoxygenation: Pre-oxygenation with 100% oxygen Induction Type: IV induction Tube type: Oral Tube size: 7.0 mm Number of attempts: 1 Airway Equipment and Method: Video-laryngoscopy and Rigid stylet Placement Confirmation: ETT inserted through vocal cords under direct vision,  positive ETCO2 and breath sounds checked- equal and bilateral Secured at: 25 cm Tube secured with: Tape Dental Injury: Teeth and Oropharynx as per pre-operative assessment  Comments: Placed under direct video visualization while cricoid is removed by Dr. Suszanne Conners.

## 2020-08-11 NOTE — Progress Notes (Signed)
Head of bed sheet hung in pt room with obturator. Spare trach and inner cannulas ordered and to be placed in pt room when arrive. RT Dearius continue to monitor.

## 2020-08-11 NOTE — Progress Notes (Addendum)
eLink Physician-Brief Progress Note Patient Name: Tyler [name] DOB: 1971/07/05 MRN: [id number]   Date of Service  08/22/2020  HPI/Events of Note  Multiple issues: 1. Patient shivering and posturing and has received Fentanyl 200 mcg/hour IV infusion and is on a Propofol IV infusion at 50  Mcg.kg/min and 2. Hypertension - BP = 167/171.  eICU Interventions  Plan: 1. Versed 2 mg IV Q 2 hours PRN posturing, seizures, anoxic myoclonus. 2. Magnesium 2 gm IV now. 3. Trend BP with control of shivering and posturing. If BP remains elevated, may require a Nicardipine IV infusion.      Intervention Category Major Interventions: Other:  Lenell Antu 08/09/2020, 8:48 PM

## 2020-08-11 NOTE — Progress Notes (Signed)
eLink Physician-Brief Progress Note Patient Name: STRUMMER CANIPE DOB: 07-Jun-1971 MRN: [id number]   Date of Service  08/24/2020  HPI/Events of Note  Hypertension - BP = 200/95 with MAP = 122. In the setting of post cardiac arrest, I am concerned that hypertension may represent evolving cerebral herniation.   eICU Interventions  Plan: 1. Nicardipine IV infusion. Titrate to SBP goal = 170.      Intervention Category Major Interventions: Hypertension - evaluation and management  Angla Delahunt Eugene 08/24/2020, 11:42 PM

## 2020-08-11 NOTE — Op Note (Signed)
DATE OF PROCEDURE:  08/06/2020                              OPERATIVE REPORT  SURGEON:  Newman Pies, MD  PREOPERATIVE DIAGNOSES: 1. Respiratory failure. 2. Previous emergency cricothyrotomy  POSTOPERATIVE DIAGNOSES: 1. Respiratory failure. 2. Previous emergency cricothyrotomy  PROCEDURE PERFORMED:  Tracheostomy  ANESTHESIA:  General endotracheal tube anesthesia.  COMPLICATIONS:  None.  ESTIMATED BLOOD LOSS:  Minimal.  INDICATION FOR PROCEDURE:  Tyler [name] is a 49 y.o. male who was admitted 2 days ago after a witnessed cardiac arrest at work. He was found to be pulseless, so CPR was started. EMS then arrived at the scene and states pt achieved ROSC after 4 rounds of CPR. King airway placed. As pt being transported to ED, noted to have bradycardia and resistance with bagging. Pt hypertensive en route. Due to concerns for upper airway obstructionwith difficult BVM, cricothyrotomy was performed by EMS. ENT now consulted for formal tracheostomy. Questions were invited and answered.  Informed consent was obtained.  DESCRIPTION:  The patient was taken to the operating room and placed supine on the operating table. General anesthesia was administered via an endotracheal tube. The cricothyrotomy tube was removed. The patient was positioned and prepped and draped in the standard fashion for tracheostomy tube placement. 1% lidocaine with 1-100,000 epinephrine was injected at the anterior neck.  The preexisting incision was carried down past the level of the platysma muscle. The strep muscles were identified and divided in midline. They were retracted laterally, exposing the thyroid gland. The thyroid was divided at midline and retracted laterally. The anterior tracheal wall was exposed. A tracheal window was made at the level of the second tracheal ring. The endotracheal tube was withdrawn. A # 8 cuffed Shiley tracheostomy tube was placed without difficulty. Good end tidal volume and CO2 return was  noted. The tracheostomy tube was secured in place with 2-0 Prolene sutures and circumferential necktie. The care of the patient was transferred to the anesthesiologist. The patient was awakened from anesthesia without difficulty. He was transferred back to the intensive care unit in stable condition.  OPERATIVE FINDINGS:  A # 8 cuffed Shiley tracheostomy tube was placed without difficulty.  SPECIMEN:  None.  FOLLOWUP CARE:  The patient Tyler [name] return to the ICU.   Tyler [name] 08/20/2020 1:33 PM

## 2020-08-11 NOTE — Anesthesia Preprocedure Evaluation (Addendum)
Anesthesia Evaluation  Patient identified by MRN, date of birth, ID band Patient awake    Reviewed: Allergy & Precautions, Patient's Chart, lab work & pertinent test results, Unable to perform ROS - Chart review only  Airway Mallampati: Trach       Dental   Pulmonary neg pulmonary ROS,    breath sounds clear to auscultation   + intubated    Cardiovascular negative cardio ROS   Rhythm:Regular Rate:Normal     Neuro/Psych negative neurological ROS     GI/Hepatic negative GI ROS, Neg liver ROS,   Endo/Other  Morbid obesity  Renal/GU negative Renal ROS     Musculoskeletal negative musculoskeletal ROS (+)   Abdominal   Peds  Hematology negative hematology ROS (+)   Anesthesia Other Findings   Reproductive/Obstetrics                           Anesthesia Physical Anesthesia Plan  ASA: IV  Anesthesia Plan: General   Post-op Pain Management:    Induction: Intravenous  PONV Risk Score and Plan: 2 and Ondansetron, Dexamethasone and Treatment may vary due to age or medical condition  Airway Management Planned: Tracheostomy  Additional Equipment:   Intra-op Plan:   Post-operative Plan: Post-operative intubation/ventilation  Informed Consent: I have reviewed the patients History and Physical, chart, labs and discussed the procedure including the risks, benefits and alternatives for the proposed anesthesia with the patient or authorized representative who has indicated his/her understanding and acceptance.     Dental advisory given  Plan Discussed with: CRNA  Anesthesia Plan Comments:         Anesthesia Quick Evaluation

## 2020-08-11 NOTE — Transfer of Care (Signed)
Immediate Anesthesia Transfer of Care Note  Patient: Tyler [name]  Procedure(s) Performed: TRACHEOSTOMY (N/A Neck)  Patient Location: ICU  Anesthesia Type:General  Level of Consciousness: Patient remains intubated per anesthesia plan  Airway & Oxygen Therapy: Patient remains intubated per anesthesia plan and Patient placed on Ventilator (see vital sign flow sheet for setting)  Post-op Assessment: Report given to RN and Post -op Vital signs reviewed and stable  Post vital signs: Reviewed and stable  Last Vitals:  Vitals Value Taken Time  BP    Temp    Pulse    Resp    SpO2      Last Pain:  Vitals:   08-20-2020 0813  TempSrc: Core  PainSc:          Complications: No complications documented.

## 2020-08-11 NOTE — Progress Notes (Signed)
Pt arrived back to 2H 20 from OR. #8 cuffed Flexible Shiley trach in place. Pt placed back on ventilator. RT Tywan continue to monitor.

## 2020-08-11 NOTE — Progress Notes (Signed)
eLink Physician-Brief Progress Note Patient Name: Tyler [name] DOB: 1971/07/08 MRN: [id number]   Date of Service  08/24/2020  HPI/Events of Note  Patient still posturing and hypertensive.   eICU Interventions  Plan: 1. Vecuronium 10 mg IV X 1 now.      Intervention Category Major Interventions: Hypertension - evaluation and management Intermediate Interventions: Other:  Lenell Antu 08/03/2020, 9:49 PM

## 2020-08-11 NOTE — Plan of Care (Signed)
  Problem: Clinical Measurements: Goal: Ability to maintain clinical measurements within normal limits Constantine improve Outcome: Progressing   Problem: Clinical Measurements: Goal: Respiratory complications Keaton improve Outcome: Progressing   Problem: Clinical Measurements: Goal: Antwione remain free from infection Outcome: Progressing   

## 2020-08-11 NOTE — Progress Notes (Signed)
NAME:  Tyler [name], MRN:  [id number], DOB:  02-16-1971, LOS: 2 ADMISSION DATE:  August 20, 2020, CONSULTATION DATE:  08/06/2020 REFERRING MD:  EDP, CHIEF COMPLAINT: Cardiac Arrest  Brief History:  49 year old male with history of GERD, PTSD, esophageal stricture and food impaction who presents with witnessed out of hospital PEA arrest that occurred while lifting at heavy object overhead at work.  Unknown total downtime prior to hospitalization, but patient was difficult to bag with BVM/ king airway and required emergent cricothyroidotomy by EMS.  PCCM consulted for admission  History of Present Illness:  Tyler [name] is a 48 year old male with past medical history of obesity, GERD, PTSD, distal esophageal stenosis with history of food impaction and requiring dilation in 08/2018, per care everywhere records in duplicate chart who presents with witnessed, out of hospital PEA arrest.   Per report from family and EMS, patient was at work and was lifting something over his head when he got a blank stare on his face and then collapsed.  Bystander CPR was immediately started, when EMS arrived for around ACLS were required.  A King airway was placed, but was subsequently difficult to bag and an emergent cricothyrotomy was performed.  On arrival to [facility] he lost pulses again and required another 10 minutes of ACLS.  He has not been responsive.  EKG showed atrial fibrillation with RVR without signs of ischemia.  Patient's fianc and daughter at the bedside, they report that he had "not been feeling well" recently with some acid reflux and upper stomach/chest discomfort.  They deny any history of hypertension, cardiac disease, diabetes, recent illness or fever.  Labs in the ED were significant for an acute kidney injury with creatinine of 1.7, glucose greater than 300, lactic acid 6.8, Covid-19 and influenza negative.  Chest x-ray with extensive airspace infiltrates.  He required low-dose Levophed for  hypotension and has been unresponsive since arrival.  PCCM consulted for admission.   Past Medical History:  GERD, esophageal stenosis, PTSD, obesity  Significant Hospital Events:  12/16 admit to critical care  Consults:  ENT - Dr. Suszanne Conners 12/17  Procedures:  12/16 emergent cric by EMS 12/16 right femoral CVC in ED  Significant Diagnostic Tests:  12/16 CXR>>Extensive subcutaneous gas at the neck base and pneumomediastinum. This may relate to either emergent tracheostomy placement or perforation of the tracheobronchial tree secondary to orogastric tube malposition. Extensive bilateral airspace infiltrate, infection versus edema versus hemorrhage.  12/16 CT head>> No acute intracranial abnormality. 12/16 CT chest>> 1. No pulmonary embolus to the segmental level. The subsegmental branches are not well assessed due to contrast bolus timing and likely diminished cardiac perfusion. 2. Extensive pneumomediastinum extending from the thoracic inlet into the upper abdomen. Subcutaneous emphysema in the neck. 3. Dense symmetric consolidations in both lower lobes, with lesser ground-glass and consolidative opacities in the dependent upper lobes, right middle lobe, and lingula. Findings most consistent with aspiration. Superimposed pulmonary edema is also considered. 4. Multi chamber cardiomegaly with contrast refluxing into the hepatic veins and IVC consistent with elevated right heart Pressures.  12/17 TTE >>LV function difficult to visualize due to poor windows, RV size and function poorly visualized 12/17 EEG >> severe diffuse encephalopathy, periodic triphasic discharges without overt seizure activity 12/17 renal ultrasound >> normal kidneys  Micro Data:  12/16 SARS-Covid-2, influenza>> 12/16 blood cultures x2>> 12/17 UC >> negative  Antimicrobials:  12/16 vanc 12/16 zosyn  12/16 unasyn  >>  Interim History / Subjective:   Seen by Dr Suszanne Conners,  planning for trach revision  today Remains on fentanyl 200, propofol 30 PRVC 540x30, 0.80, PEEP 12 I/O+ 2.9 L total Progressive renal failure  Objective   Blood pressure 137/83, pulse 82, temperature 98.8 F (37.1 C), temperature source Core, resp. rate (!) 30, height 5\' 8"  (1.727 m), weight 128 kg, SpO2 99 %.    Vent Mode: PRVC FiO2 (%):  [80 %-100 %] 80 % Set Rate:  [30 bmp] 30 bmp Vt Set:  [540 mL] 540 mL PEEP:  [12 cmH20] 12 cmH20 Plateau Pressure:  [31 cmH20-40 cmH20] 36 cmH20   Intake/Output Summary (Last 24 hours) at 08/06/2020 1034 Last data filed at 08/19/2020 0800 Gross per 24 hour  Intake 2649.62 ml  Output 280 ml  Net 2369.62 ml   Filed Weights   08/10/20 0000 08/10/20 2113 07/29/2020 0459  Weight: 123 kg 124.6 kg 128 kg   General: Critically ill obese male in NAD HEENT: ET tube via cricothyrotomy Neuro: Completely unresponsive on propofol, fentanyl CV: Regular, distant, no murmur PULM: Coarse bilateral breath sounds GI: Obese, nondistended, positive bowel sounds Extremities: No significant edema Skin: No rash, multiple tattoos   Chest x-ray 08/07/2020 reviewed by me shows persistent but significantly improved bilateral alveolar infiltrates, more on the left than on the right.  Resolved Hospital Problem list     Assessment & Plan:   Witnessed, out of hospital PEA arrest with subsequent respiratory failure and emergent tracheostomy Unclear downtime, but at least for rounds of ACLS by EMS and 10 minutes CPR in the ED with initial [facility] airway placed with subsequent resistance to bagging and emergent trach placed by EMS.   Per fianc, patient has a reported history of passing out but she cannot give further history.  Denies any known arrhythmia.   P: -Unclear cause, TTE was a poor study, unrevealing -Vasopressors weaned off -Follow telemetry -Continue TTM 37  Acute hypoxic respiratory failure secondary to cardiac arrest  Difficult airway s/p emergent cricothyroidotomy Aspiration PNA   Pneumomediastinum - suspect secondary to emergent cric P:  -PRVC 8 cc/kg -Chest x-ray improved, continue to follow -Continue Unasyn for now.  Would complete full course unless chest x-ray completely clears -Plan for cricothyrotomy revision to tracheostomy today with [facility]  Afib with RVR  P:  -In sinus rhythm, amiodarone drip discontinued -Holding off on systemic anticoagulation at this time  Acute encephalopathy Concern for hypoxic ischemic injury given prolonged cardiac arrest/ hypoxia  - ETOH on admit 55 P: -Normothermia protocol -High risk for anoxic injury -EEG consistent with probable anoxic injury -[place] begin to wean sedation and consider MRI brain once his airway is adequately protected -Thiamine/folate -Propofol and fentanyl, begin to lighten when able as above  Acute kidney injury, nearly anuric, suspect ATN from prolonged hypotension AGMA/ lactic acidosis  Mild hyperkalemia  Baseline creatinine~1 in care everywhere P: -No evidence obstruction on renal ultrasound -Suspect that he is headed for hemodialysis if a candidate based on neurological prognosis.  No urgent indication for HD today.  We Lani involve nephrology, probably 12/19 -Continue bicarbonate infusion -Follow BMP, urine output  Hyperglycemia- stress induced P:  HA1c 5.9 -Sliding scale insulin as per protocol  Hx of esophageal stricture - bedside unable to pass OGT P:  -Core track tube placement  Best practice (evaluated daily)  Diet: NPO Pain/Anxiety/Delirium protocol (if indicated): Fentanyl/ propofol VAP protocol (if indicated): Head of bed 30 degrees, suction as needed DVT prophylaxis: SCDs/ heparin  GI prophylaxis: Protonix Glucose control: Sliding scale sensitive  Mobility:  Bedrest Disposition: ICU  Goals of Care:  Last date of multidisciplinary goals of care discussion: Initial goals of care discussion on admission with patient's fianc and daughter, aggressive care and full code.   Discussed status, prognosis, plans with patient's fianc at bedside 12/18 Family and staff present: Nursing and patient's fianc and daughter Summary of discussion: Full code Follow up goals of care discussion due: 12/23 Code Status: Full code    Labs   CBC: Recent Labs  Lab 08/19/2020 1805 08/22/2020 1821 08/10/20 0226 08/10/20 0512 08/10/20 0517 08/20/20 0428 08/20/2020 0742  WBC 17.0*  --   --   --  18.5* 11.1*  --   NEUTROABS 11.6*  --   --   --   --   --   --   HGB 16.7   < > 16.7 15.6 17.0 13.6 12.6*  HCT 50.2   < > 49.0 46.0 50.4 37.4* 37.0*  MCV 101.6*  --   --   --  98.4 95.2  --   PLT 289  --   --   --  195 125*  --    < > = values in this interval not displayed.    Basic Metabolic Panel: Recent Labs  Lab 08/01/2020 1805 08/08/2020 1821 08/08/2020 1940 07/26/2020 1957 08/10/20 0512 08/10/20 0517 08/10/20 1347 08/10/20 2031 08/20/2020 0428 2020-08-20 0742  NA 140 139  --    < > 136 137 136  --  135 136  K 4.0 4.1  --    < > 5.2* 5.8* 4.8  --  4.1 3.5  CL 102 104  --   --   --  102 102  --  99  --   CO2 22  --   --   --   --  17* 21*  --  19*  --   GLUCOSE 310* 299*  --   --   --  218* 156*  --  146*  --   BUN 11 16  --   --   --  23* 33*  --  53*  --   CREATININE 1.79* 1.80*  --   --   --  3.24* 4.28*  --  5.50*  --   CALCIUM 8.7*  --   --   --   --  8.5* 8.5*  --  8.2*  --   MG  --   --  2.2  --   --  1.8 2.8* 2.3 2.4  --   PHOS  --   --   --   --   --  3.7 2.6 2.3* 3.3  --    < > = values in this interval not displayed.   GFR: Estimated Creatinine Clearance: 21.2 mL/min (A) (by C-G formula based on SCr of 5.5 mg/dL (H)). Recent Labs  Lab 08/02/2020 1805 08/17/2020 1953 08/10/20 0517 08/20/20 0428  WBC 17.0*  --  18.5* 11.1*  LATICACIDVEN 6.8* 4.1*  --   --     Liver Function Tests: Recent Labs  Lab 08/21/2020 1805 08/10/20 1347 August 20, 2020 0428  AST 104* 64* 46*  ALT 55* 56* 40  ALKPHOS 83 55 44  BILITOT 0.9 0.8 0.8  PROT 6.4* 6.7 5.7*  ALBUMIN 3.2* 3.2*  2.8*   No results for input(s): LIPASE, AMYLASE in the last 168 hours. Recent Labs  Lab 08/10/20 2031  AMMONIA 42*    ABG    Component Value Date/Time   PHART 7.425 2020/08/20 9935  PCO2ART 32.8 08/04/2020 0742   PO2ART 85 07/26/2020 0742   HCO3 21.5 08/16/2020 0742   TCO2 22 08/17/2020 0742   ACIDBASEDEF 2.0 07/25/2020 0742   O2SAT 97.0 08/24/2020 0742     Coagulation Profile: Recent Labs  Lab 08/15/2020 1805  INR 1.1    Cardiac Enzymes: No results for input(s): CKTOTAL, CKMB, CKMBINDEX, TROPONINI in the last 168 hours.  HbA1C: Hgb A1c MFr Bld  Date/Time Value Ref Range Status  07/28/2020 06:05 PM 5.9 (H) 4.8 - 5.6 % Final    Comment:    (NOTE) Pre diabetes:          5.7%-6.4%  Diabetes:              >6.4%  Glycemic control for   <7.0% adults with diabetes     CBG: Recent Labs  Lab 08/10/20 1549 08/10/20 2009 08/10/20 2338 08/04/2020 0328 08/16/2020 0815  GLUCAP 192* 131* 115* 139* 124*     Critical care time: 34 minutes     Levy Pupaobert Keldric Poyer, MD, PhD 08/13/2020, 11:26 AM [facility] [phone] or if no answer [phone]

## 2020-08-11 NOTE — Progress Notes (Signed)
Pt continues to be dysynchronous and desaturate. Peak pressures remain in the upper 40's. Pt taken off of ventilator and lavaged with 5cc saline and bagged. Pt easy to bag. Minimal pink tinge secretions suctioned from trach. Pt placed back on full support vent settings with no improvement. Dr. Delton Coombes made aware and Bricen place order for x1 paralytic. RT notified RN and Dakin continue to monitor.

## 2020-08-11 NOTE — Progress Notes (Signed)
Pt with desaturation into the 70's with good pleth. Pt lavage suctioned for copious thick bright red secretions/clots. FiO2 increased to 100% and peep to 14. Pt recovered to 94%. RT Khalon continue to monitor.

## 2020-08-12 ENCOUNTER — Inpatient Hospital Stay (HOSPITAL_COMMUNITY): Payer: Self-pay

## 2020-08-12 LAB — CBC
HCT: 38.2 % — ABNORMAL LOW (ref 39.0–52.0)
Hemoglobin: 13 g/dL (ref 13.0–17.0)
MCH: 33.4 pg (ref 26.0–34.0)
MCHC: 34 g/dL (ref 30.0–36.0)
MCV: 98.2 fL (ref 80.0–100.0)
Platelets: 118 10*3/uL — ABNORMAL LOW (ref 150–400)
RBC: 3.89 MIL/uL — ABNORMAL LOW (ref 4.22–5.81)
RDW: 12.8 % (ref 11.5–15.5)
WBC: 10.4 10*3/uL (ref 4.0–10.5)
nRBC: 0 % (ref 0.0–0.2)

## 2020-08-12 LAB — BASIC METABOLIC PANEL
Anion gap: 23 — ABNORMAL HIGH (ref 5–15)
BUN: 68 mg/dL — ABNORMAL HIGH (ref 6–20)
CO2: 20 mmol/L — ABNORMAL LOW (ref 22–32)
Calcium: 7.8 mg/dL — ABNORMAL LOW (ref 8.9–10.3)
Chloride: 94 mmol/L — ABNORMAL LOW (ref 98–111)
Creatinine, Ser: 7.3 mg/dL — ABNORMAL HIGH (ref 0.61–1.24)
GFR, Estimated: 8 mL/min — ABNORMAL LOW (ref >60)
Glucose, Bld: 178 mg/dL — ABNORMAL HIGH (ref 70–99)
Potassium: 3.4 mmol/L — ABNORMAL LOW (ref 3.5–5.1)
Sodium: 137 mmol/L (ref 135–145)

## 2020-08-12 LAB — GLUCOSE, CAPILLARY
Glucose-Capillary: 172 mg/dL — ABNORMAL HIGH (ref 70–99)
Glucose-Capillary: 153 mg/dL — ABNORMAL HIGH (ref 70–99)
Glucose-Capillary: 118 mg/dL — ABNORMAL HIGH (ref 70–99)
Glucose-Capillary: 133 mg/dL — ABNORMAL HIGH (ref 70–99)
Glucose-Capillary: 133 mg/dL — ABNORMAL HIGH (ref 70–99)
Glucose-Capillary: 147 mg/dL — ABNORMAL HIGH (ref 70–99)

## 2020-08-12 LAB — POCT I-STAT 7, (LYTES, BLD GAS, ICA,H+H)
Acid-base deficit: 2 mmol/L (ref 0.0–2.0)
Bicarbonate: 23.6 mmol/L (ref 20.0–28.0)
Calcium, Ion: 0.98 mmol/L — ABNORMAL LOW (ref 1.15–1.40)
HCT: 36 % — ABNORMAL LOW (ref 39.0–52.0)
Hemoglobin: 12.2 g/dL — ABNORMAL LOW (ref 13.0–17.0)
O2 Saturation: 81 %
Patient temperature: 35.8
Potassium: 3.3 mmol/L — ABNORMAL LOW (ref 3.5–5.1)
Sodium: 133 mmol/L — ABNORMAL LOW (ref 135–145)
TCO2: 25 mmol/L (ref 22–32)
pCO2 arterial: 42.8 mmHg (ref 32.0–48.0)
pH, Arterial: 7.345 — ABNORMAL LOW (ref 7.350–7.450)
pO2, Arterial: 45 mmHg — ABNORMAL LOW (ref 83.0–108.0)

## 2020-08-12 LAB — TRIGLYCERIDES: Triglycerides: 758 mg/dL — ABNORMAL HIGH (ref <150)

## 2020-08-12 MED ORDER — CISATRACURIUM BESYLATE 20 MG/10ML IV SOLN
0.1000 mg/kg | INTRAVENOUS | Status: DC | PRN
  Administered 2020-08-12 – 2020-08-13 (×7): 13.2 mg via INTRAVENOUS
  Filled 2020-08-12 (×6): qty 10

## 2020-08-12 MED ORDER — ARTIFICIAL TEARS OPHTHALMIC OINT
1.0000 "application " | TOPICAL_OINTMENT | Freq: Three times a day (TID) | OPHTHALMIC | Status: DC
  Administered 2020-08-12 – 2020-08-14 (×7): 1 via OPHTHALMIC
  Filled 2020-08-12 (×2): qty 3.5

## 2020-08-12 MED ORDER — CISATRACURIUM BESYLATE 20 MG/10ML IV SOLN
0.1000 mg/kg | Freq: Once | INTRAVENOUS | Status: AC
  Administered 2020-08-12: 12.8 mg via INTRAVENOUS
  Filled 2020-08-12: qty 10

## 2020-08-12 MED ORDER — MIDAZOLAM 50MG/50ML (1MG/ML) PREMIX INFUSION
0.0000 mg/h | INTRAVENOUS | Status: DC
  Administered 2020-08-12: 10 mg/h via INTRAVENOUS
  Administered 2020-08-12: 5 mg/h via INTRAVENOUS
  Administered 2020-08-12 – 2020-08-13 (×6): 10 mg/h via INTRAVENOUS
  Administered 2020-08-14: 7 mg/h via INTRAVENOUS
  Administered 2020-08-14: 6 mg/h via INTRAVENOUS
  Administered 2020-08-14: 10 mg/h via INTRAVENOUS
  Administered 2020-08-15: 8 mg/h via INTRAVENOUS
  Administered 2020-08-15: 10 mg/h via INTRAVENOUS
  Administered 2020-08-15 (×2): 8 mg/h via INTRAVENOUS
  Administered 2020-08-16 – 2020-08-17 (×6): 10 mg/h via INTRAVENOUS
  Administered 2020-08-17: 9 mg/h via INTRAVENOUS
  Administered 2020-08-17: 10 mg/h via INTRAVENOUS
  Filled 2020-08-12 (×24): qty 50

## 2020-08-12 MED ORDER — MIDAZOLAM BOLUS VIA INFUSION
1.0000 mg | INTRAVENOUS | Status: DC | PRN
  Administered 2020-08-17 (×2): 2 mg via INTRAVENOUS
  Filled 2020-08-12: qty 2

## 2020-08-12 MED ORDER — DOCUSATE SODIUM 50 MG/5ML PO LIQD
100.0000 mg | Freq: Two times a day (BID) | ORAL | Status: DC
  Administered 2020-08-12 – 2020-08-14 (×4): 100 mg
  Filled 2020-08-12 (×4): qty 10

## 2020-08-12 MED ORDER — CISATRACURIUM BESYLATE 20 MG/10ML IV SOLN
0.1000 mg/kg | Freq: Once | INTRAVENOUS | Status: AC
  Administered 2020-08-12: 13.2 mg via INTRAVENOUS
  Filled 2020-08-12 (×3): qty 10

## 2020-08-12 NOTE — Progress Notes (Signed)
PCCM Progress Note  Called to room by bedside RN and RT of desaturation on vent. On arrival patient was seen with oxygen saturations of 66% on 100% FiO2 with a good wave. Patient was removed from vent and bagged easily to an oxygen saturation of 83%. Bedside ultrasound revealed sliding lung bilaterally, CXR was also negatiev for PTX. Patient required additional paralytic for vent compliance. After paralyzing patients oxygen saturations improved to 94%.    Delfin Gant, NP-C [facility] Contact / Pager information can be found on Amion  08/12/2020, 6:42 AM

## 2020-08-12 NOTE — Progress Notes (Signed)
eLink Physician-Brief Progress Note Patient Name: Tyler [name] DOB: [birth year] MRN: [id number]   Date of Service  08/12/2020  HPI/Events of Note  Frequent loose watery stools - Nursing request for rectal tube.   eICU Interventions  Plan: 1. Place Flexiseal.      Intervention Category Major Interventions: Other:  Lenell Antu 08/12/2020, 12:57 AM

## 2020-08-12 NOTE — Progress Notes (Signed)
eLink Physician-Brief Progress Note Patient Name: RAY GERVASI DOB: Apr 22, 1971 MRN: [id number]   Date of Service  08/12/2020  HPI/Events of Note  Ventilator asynchromy - Given his creatinine = 6.84 Omeed switch to Nimbex.  eICU Interventions  Plan: 1. Nimbex 12.8  Mg IV X 1 now.         Leaman Abe Eugene 08/12/2020, 2:35 AM
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
## 2020-08-12 NOTE — Progress Notes (Signed)
Subjective: Resting comfortably in bed. On vent via trach.  Objective: Vital signs in last 24 hours: Temp:  [98 F (36.7 C)-98.8 F (37.1 C)] 98.8 F (37.1 C) (12/18 1526) Pulse Rate:  [30-141] 91 (12/19 0230) Resp:  [19-35] 27 (12/19 0230) BP: (132-200)/(79-171) 160/88 (12/19 0200) SpO2:  [84 %-100 %] 95 % (12/19 0230) Arterial Line BP: (134-240)/(64-117) 148/82 (12/19 0230) FiO2 (%):  [70 %-100 %] 100 % (12/19 0028) Weight:  [416 kg] 128 kg (12/18 0459)  Physical Exam: General appearance:On vent, unresponsive Head:Normocephalic, without obvious abnormality, atraumatic Ears: Examination of the ears shows normal auricles and external auditory canals bilaterally. Nose: Nasal examination shows normal mucosa, septum, turbinates.  Face: Facial examination shows no asymmetry.  Mouth:Normal mucosa. No injury. Neck:Trach tube in place. No bleeding. Neuro:Sedated and unresponsive.  Recent Labs    08/10/20 0517 07/29/2020 0428 08/02/2020 0742  WBC 18.5* 11.1*  --   HGB 17.0 13.6 12.6*  HCT 50.4 37.4* 37.0*  PLT 195 125*  --    Recent Labs    08/07/2020 0428 08/02/2020 0742 08/07/2020 1810  NA 135 136 135  K 4.1 3.5 3.7  CL 99  --  95*  CO2 19*  --  23  GLUCOSE 146*  --  110*  BUN 53*  --  62*  CREATININE 5.50*  --  6.84*  CALCIUM 8.2*  --  8.0*    Medications:  I have reviewed the patient's current medications. Scheduled: . acetaminophen  650 mg Oral Q4H   Or  . acetaminophen (TYLENOL) oral liquid 160 mg/5 mL  650 mg Per Tube Q4H   Or  . acetaminophen  650 mg Rectal Q4H  . chlorhexidine gluconate (MEDLINE KIT)  15 mL Mouth Rinse BID  . Chlorhexidine Gluconate Cloth  6 each Topical Daily  . feeding supplement (PROSource TF)  45 mL Per Tube TID  . folic acid  1 mg Per Tube Daily  . heparin injection (subcutaneous)  5,000 Units Subcutaneous Q8H  . insulin aspart  0-9 Units Subcutaneous Q4H  . mouth rinse  15 mL Mouth Rinse 10 times per day  . pantoprazole (PROTONIX)  IV  40 mg Intravenous QHS  . polyethylene glycol  17 g Per Tube Daily  . sodium chloride flush  10-40 mL Intracatheter Q12H  . thiamine injection  100 mg Intravenous Daily   Or  . thiamine  100 mg Per Tube Daily   Continuous: . sodium chloride 10 mL/hr at 08/17/2020 1253  . ampicillin-sulbactam (UNASYN) IV Stopped (07/26/2020 1447)  . feeding supplement (VITAL AF 1.2 CAL) Stopped (07/30/2020 0000)  . fentaNYL infusion INTRAVENOUS 200 mcg/hr (08/12/20 0155)  . folic acid (FOLVITE) IVPB Stopped (08/10/20 1403)  . niCARDipine 5 mg/hr (08/12/20 0306)  . norepinephrine (LEVOPHED) Adult infusion Stopped (08/10/20 0531)  . propofol (DIPRIVAN) infusion 50 mcg/kg/min (08/12/20 0205)  . sodium bicarbonate (isotonic) 150 mEq in D5W 1000 mL infusion 75 mL/hr at 08/12/20 0155    Assessment/Plan: POD #1 s/p trach. On vent - Doing well. No trach issues overnight. - Routine fresh trach care. - Shreyansh change trach in approximately 1 week. - Wean vent as tolerated.    LOS: 3 days   Melat Wrisley W Braxson Hollingsworth 08/12/2020, 3:28 AM

## 2020-08-12 NOTE — Procedures (Signed)
Patient Name: Tyler [name]  MRN: [id number]  Epilepsy Attending: Charlsie Quest  Referring Physician/Provider: Dr Levy Pupa Date: 08/12/2020 Duration: 25.25 mins  Patient history: 49yo M s/p cardiac arrest. EEG to evaluate for seizure.   Level of alertness: comatose  AEDs during EEG study: Versed  Technical aspects: This EEG study was done with scalp electrodes positioned according to the 10-20 International system of electrode placement. Electrical activity was acquired at a sampling rate of 500Hz  and reviewed with a high frequency filter of 70Hz  and a low frequency filter of 1Hz . EEG data were recorded continuously and digitally stored.   Description: EEG showed continuous generalized 3 to 6 Hz theta-delta slowing with overriding 13-15Hz  generalized beta activity. Patient was noted to have bilateral shoulder and arm jerking with eyes closed at 1429. Concomitant eeg before, during and after the event didn't show any eeg change to suggest seizure.  Hyperventilation and photic stimulation were not performed.     ABNORMALITY -Continuous slow, generalized  IMPRESSION: This study is suggestive of severe diffuse encephalopathy, nonspecific etiology but likely related to sedation, anoxic/hypoxic brain injury. At 1429, patient was noted to have bilateral shoulder and arm jerking with eyes closed without concomitant eeg change and was most likely NOT epileptic. No definite seizures or epileptiform discharges were seen throughout the recording.  Ronit Marczak 

## 2020-08-12 NOTE — Progress Notes (Signed)
EEG complete - results pending 

## 2020-08-13 ENCOUNTER — Encounter (HOSPITAL_COMMUNITY): Payer: Self-pay | Admitting: Otolaryngology

## 2020-08-13 ENCOUNTER — Inpatient Hospital Stay (HOSPITAL_COMMUNITY): Payer: Self-pay

## 2020-08-13 DIAGNOSIS — J9601 Acute respiratory failure with hypoxia: Secondary | ICD-10-CM

## 2020-08-13 LAB — POCT I-STAT 7, (LYTES, BLD GAS, ICA,H+H)
Acid-Base Excess: 0 mmol/L (ref 0.0–2.0)
Acid-Base Excess: 2 mmol/L (ref 0.0–2.0)
Acid-Base Excess: 2 mmol/L (ref 0.0–2.0)
Bicarbonate: 25.9 mmol/L (ref 20.0–28.0)
Bicarbonate: 27 mmol/L (ref 20.0–28.0)
Bicarbonate: 27 mmol/L (ref 20.0–28.0)
Calcium, Ion: 0.87 mmol/L — CL (ref 1.15–1.40)
Calcium, Ion: 0.9 mmol/L — ABNORMAL LOW (ref 1.15–1.40)
Calcium, Ion: 0.9 mmol/L — ABNORMAL LOW (ref 1.15–1.40)
HCT: 32 % — ABNORMAL LOW (ref 39.0–52.0)
HCT: 30 % — ABNORMAL LOW (ref 39.0–52.0)
HCT: 30 % — ABNORMAL LOW (ref 39.0–52.0)
Hemoglobin: 10.9 g/dL — ABNORMAL LOW (ref 13.0–17.0)
Hemoglobin: 10.2 g/dL — ABNORMAL LOW (ref 13.0–17.0)
Hemoglobin: 10.2 g/dL — ABNORMAL LOW (ref 13.0–17.0)
O2 Saturation: 90 %
O2 Saturation: 100 %
O2 Saturation: 76 %
Patient temperature: 38.5
Patient temperature: 102.3
Potassium: 3.4 mmol/L — ABNORMAL LOW (ref 3.5–5.1)
Potassium: 3.3 mmol/L — ABNORMAL LOW (ref 3.5–5.1)
Potassium: 3.3 mmol/L — ABNORMAL LOW (ref 3.5–5.1)
Sodium: 137 mmol/L (ref 135–145)
Sodium: 136 mmol/L (ref 135–145)
Sodium: 137 mmol/L (ref 135–145)
TCO2: 27 mmol/L (ref 22–32)
TCO2: 28 mmol/L (ref 22–32)
TCO2: 28 mmol/L (ref 22–32)
pCO2 arterial: 47.7 mmHg (ref 32.0–48.0)
pCO2 arterial: 41.7 mmHg (ref 32.0–48.0)
pCO2 arterial: 48.9 mmHg — ABNORMAL HIGH (ref 32.0–48.0)
pH, Arterial: 7.349 — ABNORMAL LOW (ref 7.350–7.450)
pH, Arterial: 7.42 (ref 7.350–7.450)
pH, Arterial: 7.358 (ref 7.350–7.450)
pO2, Arterial: 67 mmHg — ABNORMAL LOW (ref 83.0–108.0)
pO2, Arterial: 183 mmHg — ABNORMAL HIGH (ref 83.0–108.0)
pO2, Arterial: 48 mmHg — ABNORMAL LOW (ref 83.0–108.0)

## 2020-08-13 LAB — GLUCOSE, CAPILLARY
Glucose-Capillary: 143 mg/dL — ABNORMAL HIGH (ref 70–99)
Glucose-Capillary: 127 mg/dL — ABNORMAL HIGH (ref 70–99)
Glucose-Capillary: 125 mg/dL — ABNORMAL HIGH (ref 70–99)
Glucose-Capillary: 127 mg/dL — ABNORMAL HIGH (ref 70–99)
Glucose-Capillary: 129 mg/dL — ABNORMAL HIGH (ref 70–99)

## 2020-08-13 LAB — BASIC METABOLIC PANEL
Anion gap: 21 — ABNORMAL HIGH (ref 5–15)
BUN: 85 mg/dL — ABNORMAL HIGH (ref 6–20)
CO2: 22 mmol/L (ref 22–32)
Calcium: 7.2 mg/dL — ABNORMAL LOW (ref 8.9–10.3)
Chloride: 93 mmol/L — ABNORMAL LOW (ref 98–111)
Creatinine, Ser: 9.21 mg/dL — ABNORMAL HIGH (ref 0.61–1.24)
GFR, Estimated: 6 mL/min — ABNORMAL LOW (ref >60)
Glucose, Bld: 151 mg/dL — ABNORMAL HIGH (ref 70–99)
Potassium: 3.5 mmol/L (ref 3.5–5.1)
Sodium: 136 mmol/L (ref 135–145)

## 2020-08-13 LAB — CBC
HCT: 31.9 % — ABNORMAL LOW (ref 39.0–52.0)
Hemoglobin: 10.8 g/dL — ABNORMAL LOW (ref 13.0–17.0)
MCH: 33.1 pg (ref 26.0–34.0)
MCHC: 33.9 g/dL (ref 30.0–36.0)
MCV: 97.9 fL (ref 80.0–100.0)
Platelets: 109 10*3/uL — ABNORMAL LOW (ref 150–400)
RBC: 3.26 MIL/uL — ABNORMAL LOW (ref 4.22–5.81)
RDW: 13 % (ref 11.5–15.5)
WBC: 8.3 10*3/uL (ref 4.0–10.5)
nRBC: 0 % (ref 0.0–0.2)

## 2020-08-13 MED ORDER — CISATRACURIUM BOLUS VIA INFUSION
0.1000 mg/kg | Freq: Once | INTRAVENOUS | Status: AC
  Administered 2020-08-13: 13.1 mg via INTRAVENOUS
  Filled 2020-08-13: qty 14

## 2020-08-13 MED ORDER — FUROSEMIDE 10 MG/ML IJ SOLN
160.0000 mg | Freq: Once | INTRAVENOUS | Status: AC
  Administered 2020-08-13: 160 mg via INTRAVENOUS
  Filled 2020-08-13: qty 10

## 2020-08-13 MED ORDER — SODIUM CHLORIDE 0.9 % IV SOLN
0.0000 ug/kg/min | INTRAVENOUS | Status: DC
  Administered 2020-08-13 – 2020-08-14 (×2): 3 ug/kg/min via INTRAVENOUS
  Filled 2020-08-13 (×3): qty 20

## 2020-08-13 MED ORDER — FUROSEMIDE 10 MG/ML IJ SOLN
80.0000 mg | Freq: Once | INTRAMUSCULAR | Status: AC
  Administered 2020-08-13: 80 mg via INTRAVENOUS
  Filled 2020-08-13: qty 8

## 2020-08-13 NOTE — Progress Notes (Signed)
Discussed with Mr.Pamintuan's daughter, Marisa Sprinkles and fiance, at bedside regarding current clinical course. Had prolonged discussion regarding lack of improvement and evidence of multi-organ failure with oliguria and worsening oxygen requirement on mechanical ventilation. Also discussed lack of responsiveness despite weaning off sedation and concern for anoxic brain injury. Family expressed understanding but mentions they would like to continue full scope of care with aggressive care if needed as they believe 'he Shariq get better and come home no matter what.' When attempting to clarify their understanding of his clinical course, Mr.Mennella's daughter became distraught and requested cessation of goals of care discussion.

## 2020-08-13 NOTE — Progress Notes (Signed)
eLink Physician-Brief Progress Note Patient Name: Tyler [name] DOB: [birth year] MRN: [id number]   Date of Service  08/13/2020  HPI/Events of Note  Hypoxia - Sat = 89%.   eICU Interventions  Plan: 1. Increase ceiling on Fentanyl IV infusion to 400 mcg/hour. 2. Portable CXR STAT.     Intervention Category Major Interventions: Hypoxemia - evaluation and management  Terin Cragle Eugene 08/13/2020, 2:51 AM

## 2020-08-13 NOTE — Consult Note (Signed)
Reason for Consult:AKI Referring Physician: Ruthann Cancer, DO  Tyler [name] is an 49 y.o. male with unknown PMH who was sent to ED via EMS s/p witnessed arrest at work.  Per EMS notes, he collapsed at work and coworkers started CPR until EMS arrived and achieved ROSC after 4 rounds of CPR.  King airway placed but became bradycardic en route and noted to have resistance with bagging.  Underwent emergent cricothyrotomy by EMS with improved saturations.  He was brought from [place] ED to [facility] ED via EMS for further evaluation.  In the ED he was found to have hyperglycemia, AKI with Scr 1.7, covid-19 and influenza negative.  He was started on levophed for hypotension and started on amiodarone for atrial fib with RVR.  He was started on unasyn for aspiration pneumonia.  We were consulted due to worsening, oliguric,  ARF.  The trend in Scr is seen below.    Of note, he also had CT angiogram on 08/19/2020 which was negative for PE but did show pneumomediastimun and subcutaneous emphysema in the neck,  and dense bilateral consolidations of lower lobes consistent with aspiration and reflux of contrast with multichamber cardiomegaly and elevated right heart pressures.   Pt currently unresponsive on the ventilator via trach.  EEG suggestive of severe diffuse encephalopathy but likely due to anoxic brain injury.  Trend in Creatinine: Creatinine, Ser  Date/Time Value Ref Range Status  08/13/2020 04:47 AM 9.21 (H) 0.61 - 1.24 mg/dL Final  08/12/2020 03:26 AM 7.30 (H) 0.61 - 1.24 mg/dL Final  08/21/2020 06:10 PM 6.84 (H) 0.61 - 1.24 mg/dL Final  08/03/2020 04:28 AM 5.50 (H) 0.61 - 1.24 mg/dL Final  08/10/2020 01:47 PM 4.28 (H) 0.61 - 1.24 mg/dL Final  08/10/2020 05:17 AM 3.24 (H) 0.61 - 1.24 mg/dL Final  08/14/2020 06:21 PM 1.80 (H) 0.61 - 1.24 mg/dL Final  08/07/2020 06:05 PM 1.79 (H) 0.61 - 1.24 mg/dL Final    PMH:  No past medical history on file.  PSH:  No known past surgical history  Allergies: No Known  Allergies  Medications:   Prior to Admission medications   Not on File    Inpatient medications: . acetaminophen  650 mg Oral Q4H   Or  . acetaminophen (TYLENOL) oral liquid 160 mg/5 mL  650 mg Per Tube Q4H   Or  . acetaminophen  650 mg Rectal Q4H  . artificial tears  1 application Both Eyes I2L  . chlorhexidine gluconate (MEDLINE KIT)  15 mL Mouth Rinse BID  . Chlorhexidine Gluconate Cloth  6 each Topical Daily  . docusate  100 mg Per Tube BID  . feeding supplement (PROSource TF)  45 mL Per Tube TID  . folic acid  1 mg Per Tube Daily  . heparin injection (subcutaneous)  5,000 Units Subcutaneous Q8H  . insulin aspart  0-9 Units Subcutaneous Q4H  . mouth rinse  15 mL Mouth Rinse 10 times per day  . pantoprazole (PROTONIX) IV  40 mg Intravenous QHS  . polyethylene glycol  17 g Per Tube Daily  . sodium chloride flush  10-40 mL Intracatheter Q12H  . thiamine injection  100 mg Intravenous Daily   Or  . thiamine  100 mg Per Tube Daily    Discontinued Meds:   Medications Discontinued During This Encounter  Medication Reason  . fentaNYL (SUBLIMAZE) injection 50 mcg   . fentaNYL 2529mg in NS 251m(1061mml) infusion-PREMIX   . fentaNYL (SUBLIMAZE) bolus via infusion 50 mcg   .  pantoprazole (PROTONIX) injection 40 mg   . Ampicillin-Sulbactam (UNASYN) 3 g in sodium chloride 0.9 % 100 mL IVPB   . sodium zirconium cyclosilicate (LOKELMA) packet 10 g   . folic acid (FOLVITE) tablet 1 mg   . pantoprazole sodium (PROTONIX) 40 mg/20 mL oral suspension 40 mg   . thiamine tablet 100 mg   . sodium zirconium cyclosilicate (LOKELMA) packet 10 g   . amiodarone (NEXTERONE PREMIX) 360-4.14 MG/200ML-% (1.8 mg/mL) IV infusion   . amiodarone (NEXTERONE PREMIX) 360-4.14 MG/200ML-% (1.8 mg/mL) IV infusion   . polyethylene glycol (MIRALAX / GLYCOLAX) packet 17 g   . docusate (COLACE) 50 MG/5ML liquid 100 mg   . Ampicillin-Sulbactam (UNASYN) 3 g in sodium chloride 0.9 % 100 mL IVPB   . hemostatic  agents Patient Discharge  . 0.9 % irrigation (POUR BTL) Patient Discharge  . lidocaine-EPINEPHrine (XYLOCAINE W/EPI) 1 %-1:100000 (with pres) injection Patient Discharge  . propofol (DIPRIVAN) 1000 MG/100ML infusion   . norepinephrine (LEVOPHED) 91m in 2589mpremix infusion     Social History:  has no history on file for tobacco use, alcohol use, and drug use.  Family History:  No family history on file.  Review of systems not obtained due to patient factors. Weight change:   Intake/Output Summary (Last 24 hours) at 08/13/2020 1313 Last data filed at 08/13/2020 1000 Gross per 24 hour  Intake 3237.75 ml  Output 442 ml  Net 2795.75 ml   BP 125/68   Pulse 90   Temp (!) 101.84 F (38.8 C)   Resp (!) 30   Ht 5' 8" (1.727 m)   Wt 131.3 kg   SpO2 97%   BMI 44.01 kg/m  Vitals:   08/13/20 0812 08/13/20 0900 08/13/20 1000 08/13/20 1057  BP:  125/70 125/68   Pulse:  90 90   Resp:  (!) 30 (!) 30   Temp:  (!) 101.66 F (38.7 C) (!) 101.84 F (38.8 C)   TempSrc:      SpO2: 99% 97% 99% 97%  Weight:      Height:         General appearance: moderately obese and on vent via trach and unresponsive Head: Normocephalic, without obvious abnormality, atraumatic Resp: diminished breath sounds bilaterally and rales bibasilar Cardio: irregularly irregular rhythm and no rub GI: protuberant, tense, +BS Extremities: edema 2 + anasarca  Labs: Basic Metabolic Panel: Recent Labs  Lab 07/25/2020 1805 08/08/2020 1821 07/30/2020 1957 08/10/20 0517 08/10/20 1347 08/10/20 2031 07/29/2020 0428 08/21/2020 0742 08/08/2020 1810 08/12/20 0326 08/12/20 0533 08/13/20 0422 08/13/20 0447 08/13/20 0809  NA 140 139   < > 137 136  --  135 136 135 137 133* 137 136 136  K 4.0 4.1   < > 5.8* 4.8  --  4.1 3.5 3.7 3.4* 3.3* 3.4* 3.5 3.3*  CL 102 104  --  102 102  --  99  --  95* 94*  --   --  93*  --   CO2 22  --   --  17* 21*  --  19*  --  23 20*  --   --  22  --   GLUCOSE 310* 299*  --  218* 156*  --  146*   --  110* 178*  --   --  151*  --   BUN 11 16  --  23* 33*  --  53*  --  62* 68*  --   --  85*  --  CREATININE 1.79* 1.80*  --  3.24* 4.28*  --  5.50*  --  6.84* 7.30*  --   --  9.21*  --   ALBUMIN 3.2*  --   --   --  3.2*  --  2.8*  --   --   --   --   --   --   --   CALCIUM 8.7*  --   --  8.5* 8.5*  --  8.2*  --  8.0* 7.8*  --   --  7.2*  --   PHOS  --   --   --  3.7 2.6 2.3* 3.3  --  5.6*  --   --   --   --   --    < > = values in this interval not displayed.   Liver Function Tests: Recent Labs  Lab 07/28/2020 1805 08/10/20 1347 08/13/2020 0428  AST 104* 64* 46*  ALT 55* 56* 40  ALKPHOS 83 55 44  BILITOT 0.9 0.8 0.8  PROT 6.4* 6.7 5.7*  ALBUMIN 3.2* 3.2* 2.8*   No results for input(s): LIPASE, AMYLASE in the last 168 hours. Recent Labs  Lab 08/10/20 2031  AMMONIA 42*   CBC: Recent Labs  Lab 08/07/2020 1805 07/30/2020 1821 08/10/20 0517 08/08/2020 0428 08/22/2020 0742 08/12/20 0326 08/12/20 0533 08/13/20 0422 08/13/20 0447 08/13/20 0809  WBC 17.0*  --  18.5* 11.1*  --  10.4  --   --  8.3  --   NEUTROABS 11.6*  --   --   --   --   --   --   --   --   --   HGB 16.7   < > 17.0 13.6   < > 13.0 12.2* 10.9* 10.8* 10.2*  HCT 50.2   < > 50.4 37.4*   < > 38.2* 36.0* 32.0* 31.9* 30.0*  MCV 101.6*  --  98.4 95.2  --  98.2  --   --  97.9  --   PLT 289  --  195 125*  --  118*  --   --  109*  --    < > = values in this interval not displayed.   PT/INR: _0 (inr:5) Cardiac Enzymes: )No results for input(s): CKTOTAL, CKMB, CKMBINDEX, TROPONINI in the last 168 hours. CBG: Recent Labs  Lab 08/12/20 2014 08/12/20 2316 08/13/20 0323 08/13/20 0807 08/13/20 1127  GLUCAP 133* 147* 143* 127* 125*    Iron Studies: No results for input(s): IRON, TIBC, TRANSFERRIN, FERRITIN in the last 168 hours.  Xrays/Other Studies: DG Chest 1 View  Result Date: 08/12/2020 CLINICAL DATA:  Acute respiratory failure with hypoxia. Status post cardiac arrest. EXAM: CHEST  1 VIEW COMPARISON:   08/13/2020 FINDINGS: Tracheostomy tube and feeding tube remain in place. Stable mild cardiomegaly. Bilateral diffuse pulmonary airspace disease shows no significant change. No evidence of pleural effusion. IMPRESSION: Mild cardiomegaly and diffuse bilateral pulmonary airspace disease, without significant change. Electronically Signed   By: Tyler [name] M.D.   On: 08/12/2020 06:26   DG Abd 1 View  Result Date: 08/12/2020 CLINICAL DATA:  Nasogastric tube placement. EXAM: ABDOMEN - 1 VIEW COMPARISON:  12/16/2019 FINDINGS: A feeding tube is seen with tip overlying the distal gastric antrum or pylorus. No evidence of dilated bowel loops. IMPRESSION: Feeding tube tip overlies the distal gastric antrum or pylorus. Electronically Signed   By: Tyler [name] M.D.   On: 08/12/2020 06:27   DG CHEST PORT 1 VIEW  Result Date:  08/13/2020 CLINICAL DATA:  Hypoxia. EXAM: PORTABLE CHEST 1 VIEW COMPARISON:  August 12, 2020 FINDINGS: There is stable tracheostomy tube and nasogastric tube positioning. Stable diffuse bilateral infiltrates are seen. There is no evidence of a pleural effusion or pneumothorax. The cardiac silhouette is mildly enlarged. The visualized skeletal structures are unremarkable. IMPRESSION: Stable diffuse bilateral infiltrates. Electronically Signed   By: Tyler [name] M.D.   On: 08/13/2020 03:27   DG Chest Port 1 View  Result Date: 08/03/2020 CLINICAL DATA:  Respiratory failure EXAM: PORTABLE CHEST 1 VIEW COMPARISON:  August 11, 2020 FINDINGS: A feeding tube terminates below today's film. The tracheostomy tube projects over the tracheal air column. No pneumothorax. Increased opacity in left base obscuring left hemidiaphragm. Increased opacity in the medial right base. No other interval changes or acute abnormalities. IMPRESSION: 1. Increasing infiltrate in the medial right base worrisome for pneumonia. 2. Increased opacity and possibly a small associated effusion in the left base could  represent atelectasis or infiltrate. Electronically Signed   By: Tyler [name] M.D   On: 08/23/2020 17:19   EEG adult  Result Date: 08/12/2020 Tyler Havens, MD     08/12/2020  3:21 PM Patient Name: Tyler [name] MRN: [id number] Epilepsy Attending: Lora [name] Referring Physician/Provider: Dr Baltazar Apo Date: 08/12/2020 Duration: 25.25 mins Patient history: 49yo M s/p cardiac arrest. EEG to evaluate for seizure. Level of alertness: comatose AEDs during EEG study: Versed Technical aspects: This EEG study was done with scalp electrodes positioned according to the 10-20 International system of electrode placement. Electrical activity was acquired at a sampling rate of 500Hz and reviewed with a high frequency filter of 70Hz and a low frequency filter of 1Hz. EEG data were recorded continuously and digitally stored. Description: EEG showed continuous generalized 3 to 6 Hz theta-delta slowing with overriding 13-15Hz generalized beta activity. Patient was noted to have bilateral shoulder and arm jerking with eyes closed at 1429. Concomitant eeg before, during and after the event didn't show any eeg change to suggest seizure.  Hyperventilation and photic stimulation were not performed.   ABNORMALITY -Continuous slow, generalized IMPRESSION: This study is suggestive of severe diffuse encephalopathy, nonspecific etiology but likely related to sedation, anoxic/hypoxic brain injury. At 1429, patient was noted to have bilateral shoulder and arm jerking with eyes closed without concomitant eeg change and was most likely NOT epileptic. No definite seizures or epileptiform discharges were seen throughout the recording. Tyler [name]     Assessment/Plan: 1.  Oliguric AKI- presumably ischemic ATN in setting of PEA arrest requiring multiple rounds of CPR and pressors as well as contrast induced nephropathy following CT angio.  Poor urine output and rapidly rising BUN/Cr. 1. Renal US without  obstruction 2. Not responding to high dose lasix 3. Joah need to discuss with family and team members about a short trial of CRRT if he does not improve over the next 24 hours. 4. No emergent indication for dialysis at this time. 5. Juel need further input on prognosis from Neurology to help guide treatment plan and discussion with family 6. Avoid further nephrotoxic agents such as NSAIDs, Cox-II I's, IV contrast, and fleets phospha enemas. 2. Acute  Hypoxic respiratory failure s/p emergent cric and s/p trach on vent per PCCM.  Now with pneumomediastinum.  Likely ARDS.   3. Metabolic Acidosis - due to #1 on bicarb drip. 4. Acute metabolic encephalopathy- concerning for anoxic brain injury. 5. Aspiration pneumonia with sepsis on abx per PCCM 1. Off  pressors 6. Hypokalemia- replete carefully given AKI 7. Atrial fibrillation with RVR- currently rate controlled. 8. Anemia of acute illness 9. Hyperglycemia- per primary 10. Disposition- poor overall prognosis. Recommend palliative care consult to help set goals/limits of care.    Governor Rooks Zelma Mazariego 08/13/2020, 1:13 PM

## 2020-08-13 NOTE — Plan of Care (Signed)
Patient with refractory respiratory failure, ARDS throughout admission. Progressive oliguria, net +2.7L in the past 24 h, +8L for the admission. Min UOP overnight. Peak pressures in low 40s with Pplat ~36. Patient with difficult airway, crich in the field and trach revision here. Anoxic encephalopathy; has had myoclonus.   Very poor prognosis. Risks of proning are high given trach and difficult airway. Kristoph decrease Pi and increase PEEP to 15. Restart nimbex. Lasix drip ordered, start with bolus 80mg  x 1 before starting infusion.  7.34/47/67/25-- hypoxia not as severe as suspected based on pulse oximetry. Recheck ABG at 7AM.  10-23-1977, DO 08/13/20 4:24 AM  Pulmonary & Critical Care

## 2020-08-13 NOTE — Progress Notes (Signed)
eLink Physician-Brief Progress Note Patient Name: Tyler [name] DOB: 12-Nov-1970 MRN: [id number]   Date of Service  08/13/2020  HPI/Events of Note  Hypoxia - Sats 79%, Fi02 100% with peep 15, ABG on 70%/PCV/Rate /P 15 = 7.35/48.9/48.. Nimbex restarted  eICU Interventions  Plan: 1. Portable CXR STAT to r/o pneumothorax.  2. [name] request that the PCCM ground team evaluate the patient at bedside.      Intervention Category Major Interventions: Hypoxemia - evaluation and management  Kristin Lamagna Dennard Nip 08/13/2020, 9:11 PM

## 2020-08-13 NOTE — Progress Notes (Signed)
Subjective: Sedated. On vent. No trach issues overnight.  Objective: Vital signs in last 24 hours: Temp:  [98.96 F (37.2 C)-101.84 F (38.8 C)] 101.48 F (38.6 C) (12/20 0800) Pulse Rate:  [42-116] 88 (12/20 0800) Resp:  [3-42] 30 (12/20 0800) BP: (99-159)/(55-88) 121/70 (12/20 0800) SpO2:  [70 %-99 %] 99 % (12/20 0812) Arterial Line BP: (102-168)/(61-103) 115/68 (12/20 0800) FiO2 (%):  [90 %-100 %] 90 % (12/20 8466)  Physical Exam: General appearance:On vent,unresponsive Head:Normocephalic, without obvious abnormality, atraumatic Ears: Examination of the ears shows normal auricles and external auditory canals bilaterally. Nose: Nasal examination shows normal mucosa, septum, turbinates.  Face: Facial examination shows no asymmetry.  Mouth:Normal mucosa. No injury. Neck:Trach tube in place. No bleeding. Neuro:Sedatedand unresponsive.  Recent Labs    08/12/20 0326 08/12/20 0533 08/13/20 0447 08/13/20 0809  WBC 10.4  --  8.3  --   HGB 13.0   < > 10.8* 10.2*  HCT 38.2*   < > 31.9* 30.0*  PLT 118*  --  109*  --    < > = values in this interval not displayed.   Recent Labs    08/12/20 0326 08/12/20 0533 08/13/20 0447 08/13/20 0809  NA 137   < > 136 136  K 3.4*   < > 3.5 3.3*  CL 94*  --  93*  --   CO2 20*  --  22  --   GLUCOSE 178*  --  151*  --   BUN 68*  --  85*  --   CREATININE 7.30*  --  9.21*  --   CALCIUM 7.8*  --  7.2*  --    < > = values in this interval not displayed.    Medications:  I have reviewed the patient's current medications. Scheduled: . acetaminophen  650 mg Oral Q4H   Or  . acetaminophen (TYLENOL) oral liquid 160 mg/5 mL  650 mg Per Tube Q4H   Or  . acetaminophen  650 mg Rectal Q4H  . artificial tears  1 application Both Eyes Z9D  . chlorhexidine gluconate (MEDLINE KIT)  15 mL Mouth Rinse BID  . Chlorhexidine Gluconate Cloth  6 each Topical Daily  . docusate  100 mg Per Tube BID  . feeding supplement (PROSource TF)  45 mL Per  Tube TID  . folic acid  1 mg Per Tube Daily  . heparin injection (subcutaneous)  5,000 Units Subcutaneous Q8H  . insulin aspart  0-9 Units Subcutaneous Q4H  . mouth rinse  15 mL Mouth Rinse 10 times per day  . pantoprazole (PROTONIX) IV  40 mg Intravenous QHS  . polyethylene glycol  17 g Per Tube Daily  . sodium chloride flush  10-40 mL Intracatheter Q12H  . thiamine injection  100 mg Intravenous Daily   Or  . thiamine  100 mg Per Tube Daily   Continuous: . sodium chloride 10 mL/hr at 08/06/2020 1253  . ampicillin-sulbactam (UNASYN) IV Stopped (08/13/20 0536)  . cisatracurium (NIMBEX) infusion 2 mg/mL Stopped (08/13/20 0625)  . feeding supplement (VITAL AF 1.2 CAL) 1,000 mL (08/13/20 0445)  . fentaNYL infusion INTRAVENOUS 250 mcg/hr (08/13/20 0800)  . folic acid (FOLVITE) IVPB Stopped (08/10/20 1403)  . midazolam 10 mg/hr (08/13/20 0809)  . niCARDipine Stopped (08/12/20 1634)  . sodium bicarbonate (isotonic) 150 mEq in D5W 1000 mL infusion 75 mL/hr at 08/13/20 0800    Assessment/Plan: POD #2 s/p trach. On vent - No trach issues overnight. - Routine fresh trach care. -  Stefanos change trach early next week. - Wean vent as tolerated.   LOS: 4 days   Antha Niday W Tylynn Braniff 08/13/2020, 9:58 AM

## 2020-08-13 NOTE — Progress Notes (Addendum)
PCCM interval progress note:   Paged to see patient overnight for worsening hypoxic respiratory failure with pO2 48.  Pt with stable bilateral infiltrates on CXR, likely ARDS.  Sats 80% on PEEP 15 and 100% FiO2.  No worsening tachycardia or hypotension, low suspicion PE   Has had significant Lasix trial over the last day with minimal UOP and is on Unasyn.    Pt re-positioned with R side down and sats improved to 90%, discussed with RT- if sats acutely worsening Jeremey have to try proning.    Tyler Gasman Nyhla Mountjoy, PA-C
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
## 2020-08-13 NOTE — Progress Notes (Signed)
eLink Physician-Brief Progress Note Patient Name: Tyler [name] DOB: [birth year] MRN: [id number]   Date of Service  08/13/2020  HPI/Events of Note  Difficult situation - Sats dropped to 85% on 100%/PCV /Rate 18/P12. Portable CXR with diffuse interstitial infiltrate and no pneumothorax. Creatinine = 7.3. I/O is 8.4 liters positive since admission. BP = 128/74 with MAP = 89. Currently on PCV with Ppeak in 40's.  eICU Interventions  Plan: 1. Icrease PEEP to 14. 2. Lasix 160 mg IV slowly now (4 mg/min). 3. Welles ask the ground team to evaluate the patient at bedside.         Rigley Niess Dennard Nip 08/13/2020, 3:47 AM

## 2020-08-14 ENCOUNTER — Encounter (HOSPITAL_COMMUNITY): Payer: Self-pay | Admitting: Pulmonary Disease

## 2020-08-14 ENCOUNTER — Inpatient Hospital Stay (HOSPITAL_COMMUNITY): Payer: Self-pay

## 2020-08-14 ENCOUNTER — Other Ambulatory Visit: Payer: Self-pay

## 2020-08-14 DIAGNOSIS — N179 Acute kidney failure, unspecified: Secondary | ICD-10-CM

## 2020-08-14 LAB — RENAL FUNCTION PANEL
Albumin: 2.3 g/dL — ABNORMAL LOW (ref 3.5–5.0)
Albumin: 2.2 g/dL — ABNORMAL LOW (ref 3.5–5.0)
Anion gap: 24 — ABNORMAL HIGH (ref 5–15)
Anion gap: 24 — ABNORMAL HIGH (ref 5–15)
BUN: 108 mg/dL — ABNORMAL HIGH (ref 6–20)
BUN: 123 mg/dL — ABNORMAL HIGH (ref 6–20)
CO2: 23 mmol/L (ref 22–32)
CO2: 22 mmol/L (ref 22–32)
Calcium: 7.4 mg/dL — ABNORMAL LOW (ref 8.9–10.3)
Calcium: 7.3 mg/dL — ABNORMAL LOW (ref 8.9–10.3)
Chloride: 92 mmol/L — ABNORMAL LOW (ref 98–111)
Chloride: 91 mmol/L — ABNORMAL LOW (ref 98–111)
Creatinine, Ser: 10.79 mg/dL — ABNORMAL HIGH (ref 0.61–1.24)
Creatinine, Ser: 12.68 mg/dL — ABNORMAL HIGH (ref 0.61–1.24)
GFR, Estimated: 5 mL/min — ABNORMAL LOW (ref >60)
GFR, Estimated: 4 mL/min — ABNORMAL LOW (ref >60)
Glucose, Bld: 131 mg/dL — ABNORMAL HIGH (ref 70–99)
Glucose, Bld: 165 mg/dL — ABNORMAL HIGH (ref 70–99)
Phosphorus: 8.3 mg/dL — ABNORMAL HIGH (ref 2.5–4.6)
Phosphorus: 8.2 mg/dL — ABNORMAL HIGH (ref 2.5–4.6)
Potassium: 3.4 mmol/L — ABNORMAL LOW (ref 3.5–5.1)
Potassium: 3.3 mmol/L — ABNORMAL LOW (ref 3.5–5.1)
Sodium: 139 mmol/L (ref 135–145)
Sodium: 137 mmol/L (ref 135–145)

## 2020-08-14 LAB — CBC
HCT: 31.9 % — ABNORMAL LOW (ref 39.0–52.0)
Hemoglobin: 10.6 g/dL — ABNORMAL LOW (ref 13.0–17.0)
MCH: 33.1 pg (ref 26.0–34.0)
MCHC: 33.2 g/dL (ref 30.0–36.0)
MCV: 99.7 fL (ref 80.0–100.0)
Platelets: 118 10*3/uL — ABNORMAL LOW (ref 150–400)
RBC: 3.2 MIL/uL — ABNORMAL LOW (ref 4.22–5.81)
RDW: 13.3 % (ref 11.5–15.5)
WBC: 8.6 10*3/uL (ref 4.0–10.5)
nRBC: 0 % (ref 0.0–0.2)

## 2020-08-14 LAB — CULTURE, BLOOD (ROUTINE X 2)
Culture: NO GROWTH
Culture: NO GROWTH
Special Requests: ADEQUATE
Special Requests: ADEQUATE

## 2020-08-14 LAB — POCT I-STAT 7, (LYTES, BLD GAS, ICA,H+H)
Acid-base deficit: 5 mmol/L — ABNORMAL HIGH (ref 0.0–2.0)
Bicarbonate: 20.7 mmol/L (ref 20.0–28.0)
Calcium, Ion: 0.82 mmol/L — CL (ref 1.15–1.40)
HCT: 28 % — ABNORMAL LOW (ref 39.0–52.0)
Hemoglobin: 9.5 g/dL — ABNORMAL LOW (ref 13.0–17.0)
O2 Saturation: 92 %
Patient temperature: 102.1
Potassium: 3 mmol/L — ABNORMAL LOW (ref 3.5–5.1)
Sodium: 140 mmol/L (ref 135–145)
TCO2: 22 mmol/L (ref 22–32)
pCO2 arterial: 41.5 mmHg (ref 32.0–48.0)
pH, Arterial: 7.316 — ABNORMAL LOW (ref 7.350–7.450)
pO2, Arterial: 78 mmHg — ABNORMAL LOW (ref 83.0–108.0)

## 2020-08-14 LAB — MAGNESIUM: Magnesium: 2.6 mg/dL — ABNORMAL HIGH (ref 1.7–2.4)

## 2020-08-14 LAB — GLUCOSE, CAPILLARY
Glucose-Capillary: 135 mg/dL — ABNORMAL HIGH (ref 70–99)
Glucose-Capillary: 144 mg/dL — ABNORMAL HIGH (ref 70–99)
Glucose-Capillary: 145 mg/dL — ABNORMAL HIGH (ref 70–99)
Glucose-Capillary: 151 mg/dL — ABNORMAL HIGH (ref 70–99)
Glucose-Capillary: 152 mg/dL — ABNORMAL HIGH (ref 70–99)
Glucose-Capillary: 156 mg/dL — ABNORMAL HIGH (ref 70–99)
Glucose-Capillary: 171 mg/dL — ABNORMAL HIGH (ref 70–99)

## 2020-08-14 MED ORDER — PRISMASOL BGK 4/2.5 32-4-2.5 MEQ/L REPLACEMENT SOLN: Status: DC

## 2020-08-14 MED ORDER — PROSOURCE TF PO LIQD
45.0000 mL | Freq: Four times a day (QID) | ORAL | Status: DC
  Administered 2020-08-14 – 2020-08-20 (×23): 45 mL
  Filled 2020-08-14 (×23): qty 45

## 2020-08-14 MED ORDER — PRISMASOL BGK 4/2.5 32-4-2.5 MEQ/L EC SOLN: Status: DC

## 2020-08-14 MED ORDER — HEPARIN (PORCINE) 2000 UNITS/L FOR CRRT
INTRAVENOUS_CENTRAL | Status: DC | PRN
  Filled 2020-08-14 (×3): qty 1000

## 2020-08-14 MED ORDER — VITAL 1.5 CAL PO LIQD
1000.0000 mL | ORAL | Status: DC
  Administered 2020-08-14 – 2020-08-25 (×8): 1000 mL

## 2020-08-14 MED ORDER — LABETALOL HCL 5 MG/ML IV SOLN
5.0000 mg | Freq: Once | INTRAVENOUS | Status: AC
  Administered 2020-08-14: 5 mg via INTRAVENOUS
  Filled 2020-08-14: qty 4

## 2020-08-14 MED ORDER — HEPARIN SODIUM (PORCINE) 1000 UNIT/ML DIALYSIS
1000.0000 [IU] | INTRAMUSCULAR | Status: DC | PRN
Start: 2020-08-14 — End: 2020-08-26
  Administered 2020-08-17: 3400 [IU] via INTRAVENOUS_CENTRAL
  Administered 2020-08-17: 1700 [IU] via INTRAVENOUS_CENTRAL
  Administered 2020-08-24 (×4): 1300 [IU] via INTRAVENOUS_CENTRAL
  Filled 2020-08-14 (×7): qty 6
  Filled 2020-08-14: qty 4

## 2020-08-14 NOTE — Progress Notes (Signed)
Discussed case with PCCM and plan is for a short term trial of CRRT to see if his mentation improves with improvement of his AKI/Azotemia.  If he does not improve would then transition to comfort care.

## 2020-08-14 NOTE — Progress Notes (Signed)
Updated daughter on inability to place femoral hd catheter. Pt is much to unstable for transfer to IR for attempts. D/w her the potential we could try the ij all though not ideal with trach strap in place. She would appreciate that.   I did attempt to discuss with her that we did need to wait to see if his oxygenation could recover as he desaturated to <80 while attempt at femoral line was placed and he was already on max settings 100% and elevated peep with max sat now 82%.   She became quite aggressive in her verbage and began shouting that she was not going to let her dad die. I attempted to redirect her to the catheter and challenges with placement at this time and that his renal function is declining and he was not being offered long term dialysis support. I attempted to discuss with her what it would look like should renal function cont to decline or his hypoxia not improve (also breifly attempted to address his neuro status and lack of ability to fully assess). She continued to escalate her voice and stated that she would not hear what I was saying because she is not going to let her father die. I was unable to assess her understanding of his critical illness as she was not receptive conversation at this time.   I felt it best to exit after addressing that we Carold indeed attempt to cvc again but it would have to be when his oxygen improved.   Pt remains full code RN at bedside during discussion as well.

## 2020-08-14 NOTE — Procedures (Signed)
Central Venous Catheter Insertion Procedure Note  ESTEFANO VICTORY  [id number]  09/01/1970  Date:08/14/20  Time:3:48 PM   Provider Performing:Akyla Vavrek [name]   Procedure: Insertion of Non-tunneled Central Venous Catheter(36556)with US guidance (73220)    Indication(s) Hemodialysis  Consent Risks of the procedure as well as the alternatives and risks of each were explained to the patient and/or caregiver.  Consent for the procedure was obtained and is signed in the bedside chart  Anesthesia Topical only with 1% lidocaine   Timeout Verified patient identification, verified procedure, site/side was marked, verified correct patient position, special equipment/implants available, medications/allergies/relevant history reviewed, required imaging and test results available.  Sterile Technique Maximal sterile technique including full sterile barrier drape, hand hygiene, sterile gown, sterile gloves, mask, hair covering, sterile ultrasound probe cover (if used).  Procedure Description Area of catheter insertion was cleaned with chlorhexidine and draped in sterile fashion.   With real-time ultrasound guidance a HD catheter was placed into the left internal jugular vein.  Nonpulsatile blood flow and easy flushing noted in all ports.  The catheter was sutured in place and sterile dressing applied.  Complications/Tolerance None; patient tolerated the procedure well. Chest X-ray is ordered to verify placement for internal jugular or subclavian cannulation.  Chest x-ray is not ordered for femoral cannulation.  EBL Minimal  Specimen(s) None    Line was done under u/s guidance. Easily compressible vein noted with neighboring pulsatile artery. Upon stick dark red non pulsatile blood was noted. Wire easily advanced. Wire was verified to be in easily compressible/non pulsatile vessel in both cross sectional and longitudinal views under ultrasound guidance. Skin was easily dilated and catheter  advanced without issue. Wire was withdrawn from vessel. No air was aspirated thru entirety of the procedure. Pt tolerated well. No complications were appreciated.

## 2020-08-14 NOTE — Progress Notes (Signed)
Discussed with PCCM team, unable to place femoral HD catheter due to issues with worsening respiratory status.  Tyler [name] start CRRT after HD catheter can be placed.

## 2020-08-14 NOTE — Procedures (Addendum)
Hemodialysis Insertion Procedure Note Tyler [name] [id number] 21-Feb-1971  Procedure: Insertion of Hemodialysis Catheter Type: 3 port  Indications: Hemodialysis   Procedure Details Consent: Risks of procedure as well as the alternatives and risks of each were explained to the (patient/caregiver).  Consent for procedure obtained. Time Out: Verified patient identification, verified procedure, site/side was marked, verified correct patient position, special equipment/implants available, medications/allergies/relevent history reviewed, required imaging and test results available.  Performed  Maximum sterile technique was used including antiseptics, cap, gloves, gown, hand hygiene, mask and sheet. Skin prep: Chlorhexidine; local anesthetic administered A antimicrobial bonded/coated triple lumen catheter was placed in the left femoral vein due to multiple attempts, no other available access using the Seldinger technique. Ultrasound guidance used.Yes.   Catheter placed to 20 cm. Blood aspirated via all 3 ports and then flushed x 3. Line sutured x 2 and dressing applied.  Evaluation Blood flow poor Complications: Complications of unable to pass wire due to blockage Patient did tolerate procedure but desats if completely flat.   Unable to pass wire 08/14/20 11:57 updated daughter at bedsise of inability to place HD cath.  Brett Canales Tyler [name] ACNP Acute Care Nurse Practitioner [facility] Pulmonary/Critical Care Please consult Amion 08/14/2020, 11:51 AM

## 2020-08-14 NOTE — Progress Notes (Signed)
Patient laid back to place line and has had a hard time keeping SATs up since. Lavaged for large amounts bloody plugs. MD aware. RT to monitor.

## 2020-08-14 NOTE — Progress Notes (Signed)
Verbal order received from Brett Canales Minor NP to bolus 10mg  of Nimbex, of Fentanyl, and 2MG  of Versed prior to HD catheter placement.
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
## 2020-08-14 NOTE — Progress Notes (Signed)
Nutrition Follow Up  DOCUMENTATION CODES:   Obesity unspecified  INTERVENTION:   Tube feeding:  -Vital 1.5 @ 50 ml/hr via Cortrak -ProSource TF 45 ml QID  Provides: 1960 kcals, 125 grams protein, 917 ml free water.   NUTRITION DIAGNOSIS:   Inadequate oral intake related to dysphagia,acute illness as evidenced by NPO status,per patient/family report.  Ongoing  GOAL:   Patient Tyler [name] meet greater than or equal to 90% of their needs   Addressed via TF  MONITOR:   Vent status,TF tolerance,Weight trends,Labs  REASON FOR ASSESSMENT:   Ventilator    ASSESSMENT:   49 yo male admitted post witness out of hospital PEA arrest with difficulty airway requiring emergent cricothyroidotomy, AKI .PMH includes esophageal stricture with food impaction, GERD, PTSD   Pt discussed during ICU rounds and with RN.   Off NMB. Off propofol. Febrile overnight. Lasix trial produced minimal UOP results. Having difficulty obtaining line to trial CRRT. Showed some abdominal distention this am but having output via rectal tube. Adjust tube feeding to better meet needs.   GOC discussions are ongoing.   Admission weight: 125 kg  Current weight: 136.4 kg   Patient requiring ventilator support via trach  MV: 15.9 L/min Temp (24hrs), Avg:101.8 F (38.8 C), Min:100.9 F (38.3 C), Max:102.5 F (39.2 C)   UOP: 290 ml x 24 hrs  Stool: 600 ml x 24 hrs   Medications: colace, folic acid, SS novolog, miralax, thiamine  Labs: K 3.4 (L) Cr 10.79-trending up Phosphorus 8.3 (H) Mg 2.6 (H)   Flowsheet Row Most Recent Value  Orbital Region No depletion  Upper Arm Region No depletion  Thoracic and Lumbar Region Unable to assess  Buccal Region No depletion  Temple Region No depletion  Clavicle Bone Region No depletion  Clavicle and Acromion Bone Region No depletion  Dorsal Hand Unable to assess  Patellar Region No depletion  Anterior Thigh Region No depletion  Posterior Calf Region No depletion   Edema (RD Assessment) Mild  Hair Reviewed  Eyes Unable to assess  Mouth Unable to assess  Skin Reviewed  Nails Reviewed     Diet Order:   Diet Order    None      EDUCATION NEEDS:   Not appropriate for education at this time  Skin:  Skin Assessment: Skin Integrity Issues: Skin Integrity Issues:: Incisions Incisions: neck  Last BM:  12/20 via rectal tube  Height:   Ht Readings from Last 1 Encounters:  08/15/2020 5\' 8"  (1.727 m)    Weight:   Wt Readings from Last 1 Encounters:  08/14/20 (!) 136.4 kg    BMI:  Body mass index is 45.72 kg/m.  Estimated Nutritional Needs:   Kcal:  1750-1950 kcals  Protein:  125-160 g  Fluid:  >/= 2 L  08/16/20 RD, LDN Clinical Nutrition Pager listed in AMION

## 2020-08-14 NOTE — Progress Notes (Signed)
Patient ID: PATTERSON HOLLENBAUGH, male   DOB: [birth year], 49 y.o.   MRN: [id number] S: Issues with hypoxia overnight related to ARDS.  No significant increase in UOP overnight despite iv lasix. O:BP 121/78   Pulse 84   Temp (!) 100.9 F (38.3 C) (Bladder)   Resp (!) 0   Ht _0  (1.727 m)   Wt (!) 136.4 kg   SpO2 92%   BMI 45.72 kg/m   Intake/Output Summary (Last 24 hours) at 08/14/2020 0944 Last data filed at 08/14/2020 0700 Gross per 24 hour  Intake 3331.39 ml  Output 860 ml  Net 2471.39 ml   Intake/Output: I/O last 3 completed shifts: In: 5631 [I.V.:4058.7; NG/GT:835; IV Piggyback:364.3] Out: 965 [Urine:365; Stool:600]  Intake/Output this shift:  No intake/output data recorded. Weight change:  Gen: on vent via trach, sedated CVS: faint HS Resp: diminished BS bilaterally, occ rhonchi Abd: protuberant, tense, +BS Ext: 2+ anasarca  Recent Labs  Lab 08/16/2020 1805 08/08/2020 1821 08/10/20 0517 08/10/20 1347 08/10/20 2031 08/01/2020 0428 08/19/2020 0742 08/21/2020 1810 08/12/20 0326 08/12/20 0533 08/13/20 0422 08/13/20 0447 08/13/20 0809 08/13/20 2059 08/14/20 0323  NA 140   < > 137 136  --  135   < > 135 137 133* 137 136 136 137 139  K 4.0   < > 5.8* 4.8  --  4.1   < > 3.7 3.4* 3.3* 3.4* 3.5 3.3* 3.3* 3.4*  CL 102   < > 102 102  --  99  --  95* 94*  --   --  93*  --   --  92*  CO2 22  --  17* 21*  --  19*  --  23 20*  --   --  22  --   --  23  GLUCOSE 310*   < > 218* 156*  --  146*  --  110* 178*  --   --  151*  --   --  131*  BUN 11   < > 23* 33*  --  53*  --  62* 68*  --   --  85*  --   --  108*  CREATININE 1.79*   < > 3.24* 4.28*  --  5.50*  --  6.84* 7.30*  --   --  9.21*  --   --  10.79*  ALBUMIN 3.2*  --   --  3.2*  --  2.8*  --   --   --   --   --   --   --   --  2.3*  CALCIUM 8.7*  --  8.5* 8.5*  --  8.2*  --  8.0* 7.8*  --   --  7.2*  --   --  7.4*  PHOS  --   --  3.7 2.6 2.3* 3.3  --  5.6*  --   --   --   --   --   --  8.3*  AST 104*  --   --  64*  --  46*  --   --    --   --   --   --   --   --   --   ALT 55*  --   --  56*  --  40  --   --   --   --   --   --   --   --   --    < > = values in this  interval not displayed.   Liver Function Tests: Recent Labs  Lab 08/20/2020 1805 08/10/20 1347 07/25/2020 0428 08/14/20 0323  AST 104* 64* 46*  --   ALT 55* 56* 40  --   ALKPHOS 83 55 44  --   BILITOT 0.9 0.8 0.8  --   PROT 6.4* 6.7 5.7*  --   ALBUMIN 3.2* 3.2* 2.8* 2.3*   No results for input(s): LIPASE, AMYLASE in the last 168 hours. Recent Labs  Lab 08/10/20 2031  AMMONIA 42*   CBC: Recent Labs  Lab 08/03/2020 1805 08/07/2020 1821 08/10/20 0517 08/02/2020 0428 08/17/2020 0742 08/12/20 0326 08/12/20 0533 08/13/20 0447 08/13/20 0809 08/13/20 2059 08/14/20 0323  WBC 17.0*  --  18.5* 11.1*  --  10.4  --  8.3  --   --  8.6  NEUTROABS 11.6*  --   --   --   --   --   --   --   --   --   --   HGB 16.7   < > 17.0 13.6   < > 13.0   < > 10.8* 10.2* 10.2* 10.6*  HCT 50.2   < > 50.4 37.4*   < > 38.2*   < > 31.9* 30.0* 30.0* 31.9*  MCV 101.6*  --  98.4 95.2  --  98.2  --  97.9  --   --  99.7  PLT 289  --  195 125*  --  118*  --  109*  --   --  118*   < > = values in this interval not displayed.   Cardiac Enzymes: No results for input(s): CKTOTAL, CKMB, CKMBINDEX, TROPONINI in the last 168 hours. CBG: Recent Labs  Lab 08/13/20 1537 08/13/20 2031 08/14/20 0057 08/14/20 0440 08/14/20 0813  GLUCAP 127* 129* 144* 135* 145*    Iron Studies: No results for input(s): IRON, TIBC, TRANSFERRIN, FERRITIN in the last 72 hours. Studies/Results: DG CHEST PORT 1 VIEW  Result Date: 08/13/2020 CLINICAL DATA:  Hypoxia. EXAM: PORTABLE CHEST 1 VIEW COMPARISON:  In August 13, 2020 FINDINGS: There is stable tracheostomy tube and nasogastric tube positioning. Persistent diffuse bilateral infiltrates are seen. There is no evidence of a pleural effusion or pneumothorax. There is moderate to marked severity enlargement of the cardiac silhouette. Degenerative changes  seen within the mid and lower thoracic spine. IMPRESSION: 1. Stable diffuse bilateral infiltrates. Electronically Signed   By: Virgina Norfolk M.D.   On: 08/13/2020 21:36   DG CHEST PORT 1 VIEW  Result Date: 08/13/2020 CLINICAL DATA:  Hypoxia. EXAM: PORTABLE CHEST 1 VIEW COMPARISON:  August 12, 2020 FINDINGS: There is stable tracheostomy tube and nasogastric tube positioning. Stable diffuse bilateral infiltrates are seen. There is no evidence of a pleural effusion or pneumothorax. The cardiac silhouette is mildly enlarged. The visualized skeletal structures are unremarkable. IMPRESSION: Stable diffuse bilateral infiltrates. Electronically Signed   By: Virgina Norfolk M.D.   On: 08/13/2020 03:27   EEG adult  Result Date: 08/12/2020 Lora Havens, MD     08/12/2020  3:21 PM Patient Name: LARON BOORMAN MRN: [id number] Epilepsy Attending: Lora Havens Referring Physician/Provider: Dr Baltazar Apo Date: 08/12/2020 Duration: 25.25 mins Patient history: 49yo M s/p cardiac arrest. EEG to evaluate for seizure. Level of alertness: comatose AEDs during EEG study: Versed Technical aspects: This EEG study was done with scalp electrodes positioned according to the 10-20 International system of electrode placement. Electrical activity was acquired at a sampling rate of _0  and  reviewed with a high frequency filter of _0  and a low frequency filter of _1 . EEG data were recorded continuously and digitally stored. Description: EEG showed continuous generalized 3 to 6 Hz theta-delta slowing with overriding 13-_2  generalized beta activity. Patient was noted to have bilateral shoulder and arm jerking with eyes closed at 1429. Concomitant eeg before, during and after the event didn't show any eeg change to suggest seizure.  Hyperventilation and photic stimulation were not performed.   ABNORMALITY -Continuous slow, generalized IMPRESSION: This study is suggestive of severe diffuse encephalopathy, nonspecific  etiology but likely related to sedation, anoxic/hypoxic brain injury. At 1429, patient was noted to have bilateral shoulder and arm jerking with eyes closed without concomitant eeg change and was most likely NOT epileptic. No definite seizures or epileptiform discharges were seen throughout the recording. Priyanka Barbra Sarks   . acetaminophen  650 mg Oral Q4H   Or  . acetaminophen (TYLENOL) oral liquid 160 mg/5 mL  650 mg Per Tube Q4H   Or  . acetaminophen  650 mg Rectal Q4H  . artificial tears  1 application Both Eyes W2X  . chlorhexidine gluconate (MEDLINE KIT)  15 mL Mouth Rinse BID  . Chlorhexidine Gluconate Cloth  6 each Topical Daily  . docusate  100 mg Per Tube BID  . feeding supplement (PROSource TF)  45 mL Per Tube TID  . folic acid  1 mg Per Tube Daily  . heparin injection (subcutaneous)  5,000 Units Subcutaneous Q8H  . insulin aspart  0-9 Units Subcutaneous Q4H  . mouth rinse  15 mL Mouth Rinse 10 times per day  . pantoprazole (PROTONIX) IV  40 mg Intravenous QHS  . polyethylene glycol  17 g Per Tube Daily  . sodium chloride flush  10-40 mL Intracatheter Q12H  . thiamine injection  100 mg Intravenous Daily   Or  . thiamine  100 mg Per Tube Daily    BMET    Component Value Date/Time   NA 139 08/14/2020 0323   K 3.4 (L) 08/14/2020 0323   CL 92 (L) 08/14/2020 0323   CO2 23 08/14/2020 0323   GLUCOSE 131 (H) 08/14/2020 0323   BUN 108 (H) 08/14/2020 0323   CREATININE 10.79 (H) 08/14/2020 0323   CALCIUM 7.4 (L) 08/14/2020 0323   GFRNONAA 5 (L) 08/14/2020 0323   CBC    Component Value Date/Time   WBC 8.6 08/14/2020 0323   RBC 3.20 (L) 08/14/2020 0323   HGB 10.6 (L) 08/14/2020 0323   HCT 31.9 (L) 08/14/2020 0323   PLT 118 (L) 08/14/2020 0323   MCV 99.7 08/14/2020 0323   MCH 33.1 08/14/2020 0323   MCHC 33.2 08/14/2020 0323   RDW 13.3 08/14/2020 0323   LYMPHSABS 3.7 08/23/2020 1805   MONOABS 1.2 (H) 08/23/2020 1805   EOSABS 0.5 08/23/2020 1805   BASOSABS 0.0  08/08/2020 1805    Assessment/Plan: 1.  Oliguric AKI- presumably ischemic ATN in setting of PEA arrest requiring multiple rounds of CPR and pressors as well as contrast induced nephropathy following CT angio.  Poor urine output and rapidly rising BUN/Cr. 1. Renal US without obstruction 2. Not responding to high dose lasix 3. Awaiting discussion with family and team members about a short trial of CRRT vs transition to comfort care. 4. Worsening BUN/Cr 5. No emergent indication for dialysis at this time but quickly approaching need. 6. Ryen need further input on prognosis from Neurology to help guide treatment plan and discussion with family 7. Avoid further  nephrotoxic agents such as NSAIDs, Cox-II I's, IV contrast, and fleets phospha enemas. 2. Acute  Hypoxic respiratory failure s/p emergent cric and s/p trach on vent per PCCM.  Now with pneumomediastinum.  Likely ARDS.   3. Metabolic Acidosis - due to #1 on bicarb drip. 4. Acute metabolic encephalopathy- concerning for anoxic brain injury. 5. Aspiration pneumonia with sepsis on abx per PCCM 1. Off pressors 6. Hypokalemia- replete carefully given AKI 7. Atrial fibrillation with RVR- currently rate controlled. 8. Anemia of acute illness 9. Hyperglycemia- per primary 10. Disposition- poor overall prognosis. Recommend palliative care consult to help set goals/limits of care.    Donetta Potts, MD [employer] [phone]

## 2020-08-15 ENCOUNTER — Encounter (HOSPITAL_COMMUNITY): Payer: Self-pay | Admitting: Otolaryngology

## 2020-08-15 ENCOUNTER — Inpatient Hospital Stay (HOSPITAL_COMMUNITY): Payer: Self-pay

## 2020-08-15 LAB — RENAL FUNCTION PANEL
Albumin: 2.3 g/dL — ABNORMAL LOW (ref 3.5–5.0)
Albumin: 2.4 g/dL — ABNORMAL LOW (ref 3.5–5.0)
Anion gap: 20 — ABNORMAL HIGH (ref 5–15)
Anion gap: 20 — ABNORMAL HIGH (ref 5–15)
BUN: 106 mg/dL — ABNORMAL HIGH (ref 6–20)
BUN: 96 mg/dL — ABNORMAL HIGH (ref 6–20)
CO2: 24 mmol/L (ref 22–32)
CO2: 23 mmol/L (ref 22–32)
Calcium: 7.9 mg/dL — ABNORMAL LOW (ref 8.9–10.3)
Calcium: 8.3 mg/dL — ABNORMAL LOW (ref 8.9–10.3)
Chloride: 94 mmol/L — ABNORMAL LOW (ref 98–111)
Chloride: 96 mmol/L — ABNORMAL LOW (ref 98–111)
Creatinine, Ser: 10.25 mg/dL — ABNORMAL HIGH (ref 0.61–1.24)
Creatinine, Ser: 8.45 mg/dL — ABNORMAL HIGH (ref 0.61–1.24)
GFR, Estimated: 6 mL/min — ABNORMAL LOW (ref >60)
GFR, Estimated: 7 mL/min — ABNORMAL LOW (ref >60)
Glucose, Bld: 188 mg/dL — ABNORMAL HIGH (ref 70–99)
Glucose, Bld: 142 mg/dL — ABNORMAL HIGH (ref 70–99)
Phosphorus: 7.6 mg/dL — ABNORMAL HIGH (ref 2.5–4.6)
Phosphorus: 7.1 mg/dL — ABNORMAL HIGH (ref 2.5–4.6)
Potassium: 3.6 mmol/L (ref 3.5–5.1)
Potassium: 4 mmol/L (ref 3.5–5.1)
Sodium: 138 mmol/L (ref 135–145)
Sodium: 139 mmol/L (ref 135–145)

## 2020-08-15 LAB — POCT I-STAT 7, (LYTES, BLD GAS, ICA,H+H)
Acid-base deficit: 1 mmol/L (ref 0.0–2.0)
Bicarbonate: 26.7 mmol/L (ref 20.0–28.0)
Calcium, Ion: 1.05 mmol/L — ABNORMAL LOW (ref 1.15–1.40)
HCT: 37 % — ABNORMAL LOW (ref 39.0–52.0)
Hemoglobin: 12.6 g/dL — ABNORMAL LOW (ref 13.0–17.0)
O2 Saturation: 85 %
Patient temperature: 37.7
Potassium: 4.3 mmol/L (ref 3.5–5.1)
Sodium: 139 mmol/L (ref 135–145)
TCO2: 28 mmol/L (ref 22–32)
pCO2 arterial: 58 mmHg — ABNORMAL HIGH (ref 32.0–48.0)
pH, Arterial: 7.275 — ABNORMAL LOW (ref 7.350–7.450)
pO2, Arterial: 60 mmHg — ABNORMAL LOW (ref 83.0–108.0)

## 2020-08-15 LAB — CBC
HCT: 32.4 % — ABNORMAL LOW (ref 39.0–52.0)
Hemoglobin: 11.2 g/dL — ABNORMAL LOW (ref 13.0–17.0)
MCH: 33.7 pg (ref 26.0–34.0)
MCHC: 34.6 g/dL (ref 30.0–36.0)
MCV: 97.6 fL (ref 80.0–100.0)
Platelets: 127 10*3/uL — ABNORMAL LOW (ref 150–400)
RBC: 3.32 MIL/uL — ABNORMAL LOW (ref 4.22–5.81)
RDW: 13.4 % (ref 11.5–15.5)
WBC: 10.6 10*3/uL — ABNORMAL HIGH (ref 4.0–10.5)
nRBC: 0 % (ref 0.0–0.2)

## 2020-08-15 LAB — GLUCOSE, CAPILLARY
Glucose-Capillary: 189 mg/dL — ABNORMAL HIGH (ref 70–99)
Glucose-Capillary: 165 mg/dL — ABNORMAL HIGH (ref 70–99)
Glucose-Capillary: 154 mg/dL — ABNORMAL HIGH (ref 70–99)
Glucose-Capillary: 161 mg/dL — ABNORMAL HIGH (ref 70–99)
Glucose-Capillary: 138 mg/dL — ABNORMAL HIGH (ref 70–99)
Glucose-Capillary: 137 mg/dL — ABNORMAL HIGH (ref 70–99)

## 2020-08-15 LAB — MAGNESIUM: Magnesium: 2.8 mg/dL — ABNORMAL HIGH (ref 1.7–2.4)

## 2020-08-15 LAB — AMMONIA: Ammonia: 48 umol/L — ABNORMAL HIGH (ref 9–35)

## 2020-08-15 MED ORDER — DOCUSATE SODIUM 50 MG/5ML PO LIQD
100.0000 mg | Freq: Two times a day (BID) | ORAL | Status: DC
  Administered 2020-08-16 – 2020-08-17 (×3): 100 mg
  Filled 2020-08-15 (×3): qty 10

## 2020-08-15 MED ORDER — DOCUSATE SODIUM 50 MG/5ML PO LIQD: 100.0000 mg | Freq: Every day | ORAL | Status: DC | PRN

## 2020-08-15 MED ORDER — ACETAMINOPHEN 325 MG PO TABS
650.0000 mg | ORAL_TABLET | ORAL | Status: DC | PRN
Start: 2020-08-15 — End: 2020-08-26

## 2020-08-15 MED ORDER — ARTIFICIAL TEARS OPHTHALMIC OINT
1.0000 "application " | TOPICAL_OINTMENT | Freq: Three times a day (TID) | OPHTHALMIC | Status: DC
  Administered 2020-08-15 – 2020-08-17 (×7): 1 via OPHTHALMIC
  Filled 2020-08-15 (×2): qty 3.5

## 2020-08-15 MED ORDER — POLYETHYLENE GLYCOL 3350 17 G PO PACK
17.0000 g | PACK | Freq: Every day | ORAL | Status: DC
  Administered 2020-08-17: 17 g
  Filled 2020-08-15: qty 1

## 2020-08-15 MED ORDER — CISATRACURIUM BESYLATE 20 MG/10ML IV SOLN
10.0000 mg | INTRAVENOUS | Status: DC | PRN
Start: 2020-08-15 — End: 2020-08-17
  Administered 2020-08-15: 10 mg via INTRAVENOUS
  Filled 2020-08-15: qty 10

## 2020-08-15 MED ORDER — CISATRACURIUM BESYLATE 20 MG/10ML IV SOLN
10.0000 mg | Freq: Once | INTRAVENOUS | Status: AC
  Administered 2020-08-15: 10 mg via INTRAVENOUS
  Filled 2020-08-15: qty 10

## 2020-08-15 MED ORDER — DEXMEDETOMIDINE HCL IN NACL 400 MCG/100ML IV SOLN
0.0000 ug/kg/h | INTRAVENOUS | Status: DC
  Administered 2020-08-15: 0.6 ug/kg/h via INTRAVENOUS
  Filled 2020-08-15: qty 100

## 2020-08-15 MED ORDER — SODIUM CHLORIDE 0.9 % IV SOLN
0.0000 ug/kg/min | INTRAVENOUS | Status: DC
  Administered 2020-08-15: 3 ug/kg/min via INTRAVENOUS
  Administered 2020-08-16 – 2020-08-17 (×3): 2.5 ug/kg/min via INTRAVENOUS
  Administered 2020-08-17: 5 ug/kg/min via INTRAVENOUS
  Filled 2020-08-15 (×6): qty 20

## 2020-08-15 MED ORDER — ACETAMINOPHEN 650 MG RE SUPP: 650.0000 mg | RECTAL | Status: DC | PRN

## 2020-08-15 MED ORDER — CISATRACURIUM BOLUS VIA INFUSION
10.0000 mg | Freq: Once | INTRAVENOUS | Status: AC
  Administered 2020-08-15: 10 mg via INTRAVENOUS
  Filled 2020-08-15: qty 10

## 2020-08-15 MED ORDER — ACETAMINOPHEN 160 MG/5ML PO SOLN
650.0000 mg | ORAL | Status: DC | PRN
  Administered 2020-08-15 – 2020-08-17 (×3): 650 mg
  Filled 2020-08-15 (×4): qty 20.3

## 2020-08-15 NOTE — Progress Notes (Signed)
End of shift summary:  Overnight, Mr. Fahs experienced multiple episodes of mucus plugging and desaturation requiring lavage and repeated suction.  He does not tolerate turning without desaturation and long recovery time, and not at all to the left side.  Used turn assist to make position changes from supine to right side.  Copious thick, bloody oral secretions, and new onset of frankly bloody nasal drainage.  HD cath extremely positional but able to easily pull fluid without hemodynamic compromise.

## 2020-08-15 NOTE — Progress Notes (Signed)
eLink Physician-Brief Progress Note Patient Name: Tyler [name] DOB: [birth year] MRN: [id number]   Date of Service  08/15/2020  HPI/Events of Note  CxR: looks more like pulmonary edema and Pneumonia. Some air lucency in lungs, no pneumo-obvious. Risk for it.  ABG: typ2 failure. PH 7.27, respiratory acidosis with renal metabolic acidosis.   Discussed with RN. Was on nimbex in weakend was off of it for his neuro assessment. Due to his resp status, not able to go for scan. Getting CRRT- 400 ml fluid removal, on going now.   eICU Interventions  - DC precedex. - start: Nimbex gtt.  - watch for pneumo. - keep negative balance. Bicarb > 24.      Intervention Category Major Interventions: Delirium, psychosis, severe agitation - evaluation and management;Respiratory failure - evaluation and management  Ranee Gosselin 08/15/2020, 9:39 PM

## 2020-08-15 NOTE — Progress Notes (Addendum)
eLink Physician-Brief Progress Note Patient Name: XABI WITTLER DOB: Nov 15, 1970 MRN: [id number]   Date of Service  08/15/2020  HPI/Events of Note  Camera : for asynchrony pn PCV, hypoxic. Trach secretions. maxed out on fenta 400, versed 10 gtt.  CRRT filter changed in day time. Gets on and off resp distress. On Nimbex 10 at 7 PM and in day time.  Now RR again. Got fenta 100 and 2 mg versed earlier, helped briefly.  HR 109, sats 80%. SBP 130 on art line.   eICU Interventions  Get CxR, ABG. Precedex gtt.  If qtc is ok. ( qtc from 17 th 445, is OK).  discussed with bed side RN.     Intervention Category Intermediate Interventions: Respiratory distress - evaluation and management  Ranee Gosselin 08/15/2020, 9:09 PM

## 2020-08-15 NOTE — Progress Notes (Signed)
Increased RR while changing CRRT filter. RR40-50's despite increasing Fentanyl and Versed and extra boluses. Also increase in tracheal secreations during this time. Dr. [name] notified and ordered Nimbex 10mg  given--effective. Also Temp increased to 99.9 while off CRRT.

## 2020-08-15 NOTE — Progress Notes (Signed)
Patient ID: QUINTAVIUS NIEBUHR, male   DOB: 03-11-1971, 49 y.o.   MRN: [id number] S: Pt seen and examined while on CRRT.  Intubated and sedated and able to UF 2.6 liters overnight but still with high oxygen demands O:BP (!) 144/100 (BP Location: Left Arm)   Pulse 74   Temp 98.4 F (36.9 C)   Resp (!) 31   Ht 5' 8" (1.727 m)   Wt 129.5 kg   SpO2 (!) 88% Comment: MD aware.  BMI 43.41 kg/m   Intake/Output Summary (Last 24 hours) at 08/15/2020 0859 Last data filed at 08/15/2020 0800 Gross per 24 hour  Intake 5133.54 ml  Output 3872 ml  Net 1261.54 ml   Intake/Output: I/O last 3 completed shifts: In: 6822 [I.V.:4685.9; NG/GT:2036.1; IV Piggyback:100.1] Out: 2595 [Urine:460; GLOVF:6433; IRJJO:8416]  Intake/Output this shift:  Total I/O In: 97.6 [I.V.:97.6] Out: 248 [Urine:10; Other:238] Weight change: -6.9 kg Gen: on vent via trach, sedated CVS: RRR Resp: ventilated BS bilaterally Abd: protuberant, +BS, soft Ext: 2+ anasarca  Recent Labs  Lab 08/12/2020 1805 08/13/2020 1821 08/10/20 1347 08/10/20 2031 08/21/2020 0428 08/20/2020 0742 08/20/2020 1810 08/12/20 0326 08/12/20 0533 08/13/20 0447 08/13/20 0809 08/13/20 2059 08/14/20 0323 08/14/20 1703 08/14/20 1901 08/15/20 0339  NA 140   < > 136  --  135   < > 135 137   < > 136 136 137 139 140 137 138  K 4.0   < > 4.8  --  4.1   < > 3.7 3.4*   < > 3.5 3.3* 3.3* 3.4* 3.0* 3.3* 3.6  CL 102   < > 102  --  99  --  95* 94*  --  93*  --   --  92*  --  91* 94*  CO2 22   < > 21*  --  19*  --  23 20*  --  22  --   --  23  --  22 24  GLUCOSE 310*   < > 156*  --  146*  --  110* 178*  --  151*  --   --  131*  --  165* 188*  BUN 11   < > 33*  --  53*  --  62* 68*  --  85*  --   --  108*  --  123* 106*  CREATININE 1.79*   < > 4.28*  --  5.50*  --  6.84* 7.30*  --  9.21*  --   --  10.79*  --  12.68* 10.25*  ALBUMIN 3.2*  --  3.2*  --  2.8*  --   --   --   --   --   --   --  2.3*  --  2.2* 2.3*  CALCIUM 8.7*   < > 8.5*  --  8.2*  --  8.0* 7.8*  --   7.2*  --   --  7.4*  --  7.3* 7.9*  PHOS  --    < > 2.6 2.3* 3.3  --  5.6*  --   --   --   --   --  8.3*  --  8.2* 7.6*  AST 104*  --  64*  --  46*  --   --   --   --   --   --   --   --   --   --   --   ALT 55*  --  56*  --  40  --   --   --   --   --   --   --   --   --   --   --    < > =  values in this interval not displayed.   Liver Function Tests: Recent Labs  Lab 08/14/2020 1805 08/10/20 1347 08/04/2020 0428 08/14/20 0323 08/14/20 1901 08/15/20 0339  AST 104* 64* 46*  --   --   --   ALT 55* 56* 40  --   --   --   ALKPHOS 83 55 44  --   --   --   BILITOT 0.9 0.8 0.8  --   --   --   PROT 6.4* 6.7 5.7*  --   --   --   ALBUMIN 3.2* 3.2* 2.8* 2.3* 2.2* 2.3*   No results for input(s): LIPASE, AMYLASE in the last 168 hours. Recent Labs  Lab 08/10/20 2031  AMMONIA 42*   CBC: Recent Labs  Lab 08/20/2020 1805 08/22/2020 1821 08/02/2020 0428 08/21/2020 0742 08/12/20 0326 08/12/20 0533 08/13/20 0447 08/13/20 0809 08/14/20 0323 08/14/20 1703 08/15/20 0339  WBC 17.0*   < > 11.1*  --  10.4  --  8.3  --  8.6  --  10.6*  NEUTROABS 11.6*  --   --   --   --   --   --   --   --   --   --   HGB 16.7   < > 13.6   < > 13.0   < > 10.8*   < > 10.6* 9.5* 11.2*  HCT 50.2   < > 37.4*   < > 38.2*   < > 31.9*   < > 31.9* 28.0* 32.4*  MCV 101.6*   < > 95.2  --  98.2  --  97.9  --  99.7  --  97.6  PLT 289   < > 125*  --  118*  --  109*  --  118*  --  127*   < > = values in this interval not displayed.   Cardiac Enzymes: No results for input(s): CKTOTAL, CKMB, CKMBINDEX, TROPONINI in the last 168 hours. CBG: Recent Labs  Lab 08/14/20 1800 08/14/20 1944 08/14/20 2340 08/15/20 0344 08/15/20 0737  GLUCAP 171* 151* 156* 189* 165*    Iron Studies: No results for input(s): IRON, TIBC, TRANSFERRIN, FERRITIN in the last 72 hours. Studies/Results: DG Chest 1 View  Result Date: 08/14/2020 CLINICAL DATA:  49 year old male status post intravenous line placement. EXAM: CHEST  1 VIEW COMPARISON:   Chest radiograph dated 08/13/2020. FINDINGS: Interval placement of a left IJ central venous line with tip over mid SVC at the level of the left innominate vein. Tracheostomy above the carina and feeding tube extends below the diaphragm with tip beyond the inferior margin of the image. Diffuse bilateral pulmonary opacities slightly worsened on the right compared to the prior radiograph. A small right pleural effusion is suspected. No pneumothorax. Stable cardiomegaly. No acute osseous pathology. IMPRESSION: Interval placement of a left IJ central venous line with tip over mid SVC at the level of the left innominate vein. No pneumothorax. Electronically Signed   By: Anner Crete M.D.   On: 08/14/2020 15:58   DG CHEST PORT 1 VIEW  Result Date: 08/13/2020 CLINICAL DATA:  Hypoxia. EXAM: PORTABLE CHEST 1 VIEW COMPARISON:  In August 13, 2020 FINDINGS: There is stable tracheostomy tube and nasogastric tube positioning. Persistent diffuse bilateral infiltrates are seen. There is no evidence of a pleural effusion or pneumothorax. There is moderate to marked severity enlargement of the cardiac silhouette. Degenerative changes seen within the mid and lower thoracic spine. IMPRESSION: 1. Stable diffuse bilateral infiltrates.  Electronically Signed   By: Virgina Norfolk M.D.   On: 08/13/2020 21:36   . acetaminophen  650 mg Oral Q4H   Or  . acetaminophen (TYLENOL) oral liquid 160 mg/5 mL  650 mg Per Tube Q4H   Or  . acetaminophen  650 mg Rectal Q4H  . chlorhexidine gluconate (MEDLINE KIT)  15 mL Mouth Rinse BID  . Chlorhexidine Gluconate Cloth  6 each Topical Daily  . feeding supplement (PROSource TF)  45 mL Per Tube QID  . folic acid  1 mg Per Tube Daily  . heparin injection (subcutaneous)  5,000 Units Subcutaneous Q8H  . insulin aspart  0-9 Units Subcutaneous Q4H  . mouth rinse  15 mL Mouth Rinse 10 times per day  . pantoprazole (PROTONIX) IV  40 mg Intravenous QHS  . sodium chloride flush  10-40 mL  Intracatheter Q12H  . thiamine injection  100 mg Intravenous Daily   Or  . thiamine  100 mg Per Tube Daily    BMET    Component Value Date/Time   NA 138 08/15/2020 0339   K 3.6 08/15/2020 0339   CL 94 (L) 08/15/2020 0339   CO2 24 08/15/2020 0339   GLUCOSE 188 (H) 08/15/2020 0339   BUN 106 (H) 08/15/2020 0339   CREATININE 10.25 (H) 08/15/2020 0339   CALCIUM 7.9 (L) 08/15/2020 0339   GFRNONAA 6 (L) 08/15/2020 0339   CBC    Component Value Date/Time   WBC 10.6 (H) 08/15/2020 0339   RBC 3.32 (L) 08/15/2020 0339   HGB 11.2 (L) 08/15/2020 0339   HCT 32.4 (L) 08/15/2020 0339   PLT 127 (L) 08/15/2020 0339   MCV 97.6 08/15/2020 0339   MCH 33.7 08/15/2020 0339   MCHC 34.6 08/15/2020 0339   RDW 13.4 08/15/2020 0339   LYMPHSABS 3.7 08/18/2020 1805   MONOABS 1.2 (H) 07/30/2020 1805   EOSABS 0.5 07/31/2020 1805   BASOSABS 0.0 07/27/2020 1805     Assessment/Plan: 1. Oliguric AKI- presumably ischemic ATN in setting of PEA arrest requiring multiple rounds of CPR and pressors as well as contrast induced nephropathy following CT angio. Poor urine output and rapidly rising BUN/Cr. 1. Renal US without obstruction 2. Not responding to high dose lasix 3. Started on CRRT 08/14/20 4. All fluids 4K/2.5Ca:  Pre-filter 500 ml/hr, post-filter 300 ml/hr, Dialysate 1500 ml/hr 5. UF goal 50-100 but given hypoxia and anasarca Nayib increase to 300 ml/hr (not on pressors) 6. Anticoagulation:  none 7. Caidon continue with CRRT for several days for volume and clearance.  Then Deveon need to reassess cognitive function.  If no significant improvement would recommend transition to comfort care given poor overall prognosis for meaningful recovery (due to anoxic brain injury) 8. Left IJ temp catheter placed 08/14/20 by PCCM 9. Avoid further nephrotoxic agents such as NSAIDs, Cox-II I's, IV contrast, and fleets phospha enemas. 2. Acute Hypoxic respiratory failure s/p emergent cric and s/p trach on vent per  PCCM. Now with pneumomediastinum. Likely ARDS.  3. Metabolic Acidosis - due to #1 on bicarb drip. 4. Acute metabolic encephalopathy- due to severe hypoxic-ischemic encephalopathy per Neuro.  Royal plan for trial of CRRT and re-evaluate when uremia has cleared. 5. Aspiration pneumonia with sepsis on abx per PCCM 1. Off pressors 6. Hypokalemia- replete carefully given AKI 7. Atrial fibrillation with RVR- currently rate controlled. 8. Anemia of acute illness 9. Hyperglycemia- per primary 10. Disposition- poor overall prognosis. Recommend palliative care consult to help set goals/limits of care.  Broadus John  Denice Paradise, MD [employer] [phone]

## 2020-08-15 NOTE — Progress Notes (Signed)
Subjective: Sedated, on vent. Desaturation episodes overnight. No trach issues  Objective: Vital signs in last 24 hours: Temp:  [98.4 F (36.9 C)-101.8 F (38.8 C)] 98.4 F (36.9 C) (12/22 0732) Pulse Rate:  [72-105] 72 (12/22 0900) Resp:  [16-46] 30 (12/22 0900) BP: (125-152)/(78-107) 139/94 (12/22 0900) SpO2:  [79 %-93 %] 90 % (12/22 0900) Arterial Line BP: (141-179)/(79-99) 142/79 (12/22 0900) FiO2 (%):  [100 %] 100 % (12/22 0805) Weight:  [129.5 kg] 129.5 kg (12/22 0400)  Physical Exam: General appearance:On vent,unresponsive Head:Normocephalic, without obvious abnormality, atraumatic Ears: Examination of the ears shows normal auricles and external auditory canals bilaterally. Nose: Nasal examination shows normal mucosa, septum, turbinates.  Face: Facial examination shows no asymmetry.  Mouth:Normal mucosa. No injury. Neck:Trach tube in place. No bleeding. Neuro:Sedatedand unresponsive.   Recent Labs    08/14/20 0323 08/14/20 1703 08/15/20 0339  WBC 8.6  --  10.6*  HGB 10.6* 9.5* 11.2*  HCT 31.9* 28.0* 32.4*  PLT 118*  --  127*   Recent Labs    08/14/20 1901 08/15/20 0339  NA 137 138  K 3.3* 3.6  CL 91* 94*  CO2 22 24  GLUCOSE 165* 188*  BUN 123* 106*  CREATININE 12.68* 10.25*  CALCIUM 7.3* 7.9*    Medications:  I have reviewed the patient's current medications. Scheduled: . acetaminophen  650 mg Oral Q4H   Or  . acetaminophen (TYLENOL) oral liquid 160 mg/5 mL  650 mg Per Tube Q4H   Or  . acetaminophen  650 mg Rectal Q4H  . chlorhexidine gluconate (MEDLINE KIT)  15 mL Mouth Rinse BID  . Chlorhexidine Gluconate Cloth  6 each Topical Daily  . feeding supplement (PROSource TF)  45 mL Per Tube QID  . folic acid  1 mg Per Tube Daily  . heparin injection (subcutaneous)  5,000 Units Subcutaneous Q8H  . insulin aspart  0-9 Units Subcutaneous Q4H  . mouth rinse  15 mL Mouth Rinse 10 times per day  . pantoprazole (PROTONIX) IV  40 mg Intravenous  QHS  . sodium chloride flush  10-40 mL Intracatheter Q12H  . thiamine  100 mg Per Tube Daily   Continuous: .  prismasol BGK 4/2.5 500 mL/hr at 08/15/20 0544  .  prismasol BGK 4/2.5 300 mL/hr at 08/14/20 1956  . feeding supplement (VITAL 1.5 CAL) 1,000 mL (08/14/20 1724)  . fentaNYL infusion INTRAVENOUS 100 mcg/hr (08/15/20 0800)  . midazolam 8 mg/hr (08/15/20 0902)  . prismasol BGK 4/2.5 1,500 mL/hr at 08/15/20 0915    Assessment/Plan: POD #4 s/p trach. On vent - No trach issues overnight. Episodes of O2 desat overnight. - Routine fresh trach care. - Jastin change trach early next week. - Wean vent as tolerated.   LOS: 6 days   Kiahna Banghart W Aneesha Holloran 08/15/2020, 9:21 AM
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
## 2020-08-15 NOTE — Progress Notes (Signed)
Updated Tyler [name]'s daughter, Tyler [name], regarding current clinical course and Tyler [name]'s tenuous status. Discussed plan to repeat CT head imaging when his respiratory status is more stable and monitoring closely while undergoing dialysis. Discussed and offered consultation with palliative care for Tyler [name] due to severity of his illness but Tyler [name] declined. She mentions she may change her mind in the future but for now she wants to 'focus on the positive.' Clarified palliative care's role in assistance with symptom management and goals of care but she confirmed that she would not speak with palliative care team. All other questions and concerns addressed.

## 2020-08-16 ENCOUNTER — Inpatient Hospital Stay (HOSPITAL_COMMUNITY): Payer: Self-pay

## 2020-08-16 LAB — MAGNESIUM: Magnesium: 3.1 mg/dL — ABNORMAL HIGH (ref 1.7–2.4)

## 2020-08-16 LAB — HEPATIC FUNCTION PANEL
ALT: 71 U/L — ABNORMAL HIGH (ref 0–44)
AST: 45 U/L — ABNORMAL HIGH (ref 15–41)
Albumin: 2.4 g/dL — ABNORMAL LOW (ref 3.5–5.0)
Alkaline Phosphatase: 144 U/L — ABNORMAL HIGH (ref 38–126)
Bilirubin, Direct: 0.3 mg/dL — ABNORMAL HIGH (ref 0.0–0.2)
Indirect Bilirubin: 0.4 mg/dL (ref 0.3–0.9)
Total Bilirubin: 0.7 mg/dL (ref 0.3–1.2)
Total Protein: 7.5 g/dL (ref 6.5–8.1)

## 2020-08-16 LAB — RENAL FUNCTION PANEL
Albumin: 2.3 g/dL — ABNORMAL LOW (ref 3.5–5.0)
Albumin: 2.3 g/dL — ABNORMAL LOW (ref 3.5–5.0)
Anion gap: 18 — ABNORMAL HIGH (ref 5–15)
Anion gap: 18 — ABNORMAL HIGH (ref 5–15)
BUN: 91 mg/dL — ABNORMAL HIGH (ref 6–20)
BUN: 91 mg/dL — ABNORMAL HIGH (ref 6–20)
CO2: 22 mmol/L (ref 22–32)
CO2: 20 mmol/L — ABNORMAL LOW (ref 22–32)
Calcium: 8.4 mg/dL — ABNORMAL LOW (ref 8.9–10.3)
Calcium: 8.3 mg/dL — ABNORMAL LOW (ref 8.9–10.3)
Chloride: 97 mmol/L — ABNORMAL LOW (ref 98–111)
Chloride: 99 mmol/L (ref 98–111)
Creatinine, Ser: 6.65 mg/dL — ABNORMAL HIGH (ref 0.61–1.24)
Creatinine, Ser: 5.84 mg/dL — ABNORMAL HIGH (ref 0.61–1.24)
GFR, Estimated: 9 mL/min — ABNORMAL LOW (ref >60)
GFR, Estimated: 11 mL/min — ABNORMAL LOW (ref >60)
Glucose, Bld: 172 mg/dL — ABNORMAL HIGH (ref 70–99)
Glucose, Bld: 120 mg/dL — ABNORMAL HIGH (ref 70–99)
Phosphorus: 7.7 mg/dL — ABNORMAL HIGH (ref 2.5–4.6)
Phosphorus: 4 mg/dL (ref 2.5–4.6)
Potassium: 4.7 mmol/L (ref 3.5–5.1)
Potassium: 3.9 mmol/L (ref 3.5–5.1)
Sodium: 137 mmol/L (ref 135–145)
Sodium: 137 mmol/L (ref 135–145)

## 2020-08-16 LAB — POCT I-STAT 7, (LYTES, BLD GAS, ICA,H+H)
Acid-Base Excess: 1 mmol/L (ref 0.0–2.0)
Acid-base deficit: 1 mmol/L (ref 0.0–2.0)
Bicarbonate: 26.4 mmol/L (ref 20.0–28.0)
Bicarbonate: 27.5 mmol/L (ref 20.0–28.0)
Calcium, Ion: 1.05 mmol/L — ABNORMAL LOW (ref 1.15–1.40)
Calcium, Ion: 1.04 mmol/L — ABNORMAL LOW (ref 1.15–1.40)
HCT: 35 % — ABNORMAL LOW (ref 39.0–52.0)
HCT: 36 % — ABNORMAL LOW (ref 39.0–52.0)
Hemoglobin: 11.9 g/dL — ABNORMAL LOW (ref 13.0–17.0)
Hemoglobin: 12.2 g/dL — ABNORMAL LOW (ref 13.0–17.0)
O2 Saturation: 65 %
O2 Saturation: 100 %
Patient temperature: 36.1
Patient temperature: 36.1
Potassium: 4.3 mmol/L (ref 3.5–5.1)
Potassium: 4.8 mmol/L (ref 3.5–5.1)
Sodium: 136 mmol/L (ref 135–145)
Sodium: 135 mmol/L (ref 135–145)
TCO2: 28 mmol/L (ref 22–32)
TCO2: 29 mmol/L (ref 22–32)
pCO2 arterial: 55.1 mmHg — ABNORMAL HIGH (ref 32.0–48.0)
pCO2 arterial: 48.8 mmHg — ABNORMAL HIGH (ref 32.0–48.0)
pH, Arterial: 7.284 — ABNORMAL LOW (ref 7.350–7.450)
pH, Arterial: 7.355 (ref 7.350–7.450)
pO2, Arterial: 36 mmHg — CL (ref 83.0–108.0)
pO2, Arterial: 322 mmHg — ABNORMAL HIGH (ref 83.0–108.0)

## 2020-08-16 LAB — GLUCOSE, CAPILLARY
Glucose-Capillary: 160 mg/dL — ABNORMAL HIGH (ref 70–99)
Glucose-Capillary: 165 mg/dL — ABNORMAL HIGH (ref 70–99)
Glucose-Capillary: 149 mg/dL — ABNORMAL HIGH (ref 70–99)
Glucose-Capillary: 85 mg/dL (ref 70–99)
Glucose-Capillary: 101 mg/dL — ABNORMAL HIGH (ref 70–99)
Glucose-Capillary: 139 mg/dL — ABNORMAL HIGH (ref 70–99)

## 2020-08-16 LAB — CBC
HCT: 37.1 % — ABNORMAL LOW (ref 39.0–52.0)
Hemoglobin: 11.9 g/dL — ABNORMAL LOW (ref 13.0–17.0)
MCH: 33 pg (ref 26.0–34.0)
MCHC: 32.1 g/dL (ref 30.0–36.0)
MCV: 102.8 fL — ABNORMAL HIGH (ref 80.0–100.0)
Platelets: 151 10*3/uL (ref 150–400)
RBC: 3.61 MIL/uL — ABNORMAL LOW (ref 4.22–5.81)
RDW: 13.4 % (ref 11.5–15.5)
WBC: 13.4 10*3/uL — ABNORMAL HIGH (ref 4.0–10.5)
nRBC: 0 % (ref 0.0–0.2)

## 2020-08-16 MED ORDER — HEPARIN (PORCINE) 2000 UNITS/L FOR CRRT
INTRAVENOUS_CENTRAL | Status: DC | PRN
  Filled 2020-08-16 (×3): qty 1000

## 2020-08-16 MED ORDER — CHLORHEXIDINE GLUCONATE 0.12 % MT SOLN
OROMUCOSAL | Status: AC
  Administered 2020-08-16: 15 mL via OROMUCOSAL
  Filled 2020-08-16: qty 15

## 2020-08-16 MED ORDER — B COMPLEX-C PO TABS
1.0000 | ORAL_TABLET | Freq: Every day | ORAL | Status: DC
  Administered 2020-08-16 – 2020-08-25 (×10): 1 via ORAL
  Filled 2020-08-16 (×10): qty 1

## 2020-08-16 MED ORDER — HYDRALAZINE HCL 20 MG/ML IJ SOLN
10.0000 mg | INTRAMUSCULAR | Status: DC | PRN
  Administered 2020-08-16 – 2020-08-21 (×6): 10 mg via INTRAVENOUS
  Filled 2020-08-16 (×6): qty 1

## 2020-08-16 MED ORDER — LABETALOL HCL 5 MG/ML IV SOLN
10.0000 mg | Freq: Once | INTRAVENOUS | Status: AC
  Administered 2020-08-16: 10 mg via INTRAVENOUS
  Filled 2020-08-16: qty 4

## 2020-08-16 MED ORDER — ROCURONIUM BROMIDE 10 MG/ML (PF) SYRINGE: 10.0000 mg | PREFILLED_SYRINGE | Freq: Once | INTRAVENOUS | Status: DC

## 2020-08-16 NOTE — Progress Notes (Signed)
Pt proned at 0230 with 2 RT's and RN assistance. Pt now satting in the upper 90's.
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
## 2020-08-16 NOTE — Progress Notes (Signed)
PCCM interval progress note:   Worsening oxygenation to 60-70%'s overnight despite maximal vent settings, sedation and paralytics  and requiring bagging, CXR with globally worsening infiltrates likely worsening ARDS.   Pt proned successfully and oxygen sats improved to 90%'s.   Tyler Gasman Evanna Washinton, PA-C

## 2020-08-16 NOTE — Progress Notes (Signed)
Patient placed back in supine position with no complications.  Sats and vitals are stable at this time.  Helio continue to monitor.

## 2020-08-16 NOTE — Progress Notes (Signed)
Patient ID: Tyler [name], male   DOB: 07-31-1971, 49 y.o.   MRN: [id number]   S:  on CRRT- reasonably stable overnight.  Intubated and sedated and able to UF 6.6 liters overnight  !! but still with high oxygen demands- prone this AM  O:BP 134/83   Pulse 78   Temp (!) 97.2 F (36.2 C)   Resp (!) 30   Ht _0  (1.727 m)   Wt 130.1 kg   SpO2 92%   BMI 43.61 kg/m   Intake/Output Summary (Last 24 hours) at 08/16/2020 0739 Last data filed at 08/16/2020 0700 Gross per 24 hour  Intake 2693.46 ml  Output 9390 ml  Net -6696.54 ml   Intake/Output: I/O last 3 completed shifts: In: 5307.2 [I.V.:3287.2; NG/GT:2020] Out: 40981 [Urine:318; XBJYN:82956; Stool:975]  Intake/Output this shift:  No intake/output data recorded. Weight change: 0.6 kg Gen: on vent via trach, sedated-  IJ cath placed 12/21  CVS: RRR Resp: ventilated BS bilaterally Abd: protuberant, +BS, soft Ext: 2+ edema  Recent Labs  Lab 07/25/2020 1805 08/12/2020 1821 08/10/20 1347 08/10/20 2031 08/10/2020 0428 08/12/2020 0742 08/02/2020 1810 08/12/20 0326 08/12/20 0533 08/13/20 0447 08/13/20 0809 08/14/20 0323 08/14/20 1703 08/14/20 1901 08/15/20 0339 08/15/20 1534 08/15/20 2120 08/16/20 0117 08/16/20 0427 08/16/20 0641  NA 140   < > 136  --  135   < > 135 137   < > 136   < > 139   < > 137 138 139 139 136 137 135  K 4.0   < > 4.8  --  4.1   < > 3.7 3.4*   < > 3.5   < > 3.4*   < > 3.3* 3.6 4.0 4.3 4.3 4.7 4.8  CL 102   < > 102  --  99  --  95* 94*  --  93*  --  92*  --  91* 94* 96*  --   --  97*  --   CO2 22   < > 21*  --  19*  --  23 20*  --  22  --  23  --  _1 --   --  22  --   GLUCOSE 310*   < > 156*  --  146*  --  110* 178*  --  151*  --  131*  --  165* 188* 142*  --   --  172*  --   BUN 11   < > 33*  --  53*  --  62* 68*  --  85*  --  108*  --  123* 106* 96*  --   --  91*  --   CREATININE 1.79*   < > 4.28*  --  5.50*  --  6.84* 7.30*  --  9.21*  --  10.79*  --  12.68* 10.25* 8.45*  --   --  6.65*  --    ALBUMIN 3.2*  --  3.2*  --  2.8*  --   --   --   --   --   --  2.3*  --  2.2* 2.3* 2.4*  --   --  2.4*  2.3*  --   CALCIUM 8.7*   < > 8.5*  --  8.2*  --  8.0* 7.8*  --  7.2*  --  7.4*  --  7.3* 7.9* 8.3*  --   --  8.4*  --   PHOS  --    < >  2.6   < > 3.3  --  5.6*  --   --   --   --  8.3*  --  8.2* 7.6* 7.1*  --   --  7.7*  --   AST 104*  --  64*  --  46*  --   --   --   --   --   --   --   --   --   --   --   --   --  45*  --   ALT 55*  --  56*  --  40  --   --   --   --   --   --   --   --   --   --   --   --   --  71*  --    < > = values in this interval not displayed.   Liver Function Tests: Recent Labs  Lab 08/10/20 1347 08/23/2020 0428 08/14/20 0323 08/15/20 0339 08/15/20 1534 08/16/20 0427  AST 64* 46*  --   --   --  45*  ALT 56* 40  --   --   --  71*  ALKPHOS 55 44  --   --   --  144*  BILITOT 0.8 0.8  --   --   --  0.7  PROT 6.7 5.7*  --   --   --  7.5  ALBUMIN 3.2* 2.8*   < > 2.3* 2.4* 2.4*  2.3*   < > = values in this interval not displayed.   No results for input(s): LIPASE, AMYLASE in the last 168 hours. Recent Labs  Lab 08/10/20 2031 08/15/20 1031  AMMONIA 42* 48*   CBC: Recent Labs  Lab 08/16/2020 1805 08/18/2020 1821 08/12/20 0326 08/12/20 0533 08/13/20 0447 08/13/20 0809 08/14/20 0323 08/14/20 1703 08/15/20 0339 08/15/20 2120 08/16/20 0117 08/16/20 0427 08/16/20 0641  WBC 17.0*   < > 10.4  --  8.3  --  8.6  --  10.6*  --   --  13.4*  --   NEUTROABS 11.6*  --   --   --   --   --   --   --   --   --   --   --   --   HGB 16.7   < > 13.0   < > 10.8*   < > 10.6*   < > 11.2*   < > 11.9* 11.9* 12.2*  HCT 50.2   < > 38.2*   < > 31.9*   < > 31.9*   < > 32.4*   < > 35.0* 37.1* 36.0*  MCV 101.6*   < > 98.2  --  97.9  --  99.7  --  97.6  --   --  102.8*  --   PLT 289   < > 118*  --  109*  --  118*  --  127*  --   --  151  --    < > = values in this interval not displayed.   Cardiac Enzymes: No results for input(s): CKTOTAL, CKMB, CKMBINDEX, TROPONINI in the  last 168 hours. CBG: Recent Labs  Lab 08/15/20 1150 08/15/20 1538 08/15/20 2000 08/15/20 2356 08/16/20 0427  GLUCAP 161* 138* 137* 165* 160*    Iron Studies: No results for input(s): IRON, TIBC, TRANSFERRIN, FERRITIN in the last 72 hours. Studies/Results: DG Chest 1 View  Result Date: 08/14/2020 CLINICAL DATA:  49 year old male status post intravenous line placement. EXAM: CHEST  1 VIEW COMPARISON:  Chest radiograph dated 08/13/2020. FINDINGS: Interval placement of a left IJ central venous line with tip over mid SVC at the level of the left innominate vein. Tracheostomy above the carina and feeding tube extends below the diaphragm with tip beyond the inferior margin of the image. Diffuse bilateral pulmonary opacities slightly worsened on the right compared to the prior radiograph. A small right pleural effusion is suspected. No pneumothorax. Stable cardiomegaly. No acute osseous pathology. IMPRESSION: Interval placement of a left IJ central venous line with tip over mid SVC at the level of the left innominate vein. No pneumothorax. Electronically Signed   By: Anner Crete M.D.   On: 08/14/2020 15:58   DG CHEST PORT 1 VIEW  Result Date: 08/16/2020 CLINICAL DATA:  Hypoxemia EXAM: PORTABLE CHEST 1 VIEW COMPARISON:  Radiograph 08/15/2020 FINDINGS: *Tracheostomy tube tip in the upper trachea, 6.1 cm from the carina. *Transesophageal tube tip terminates below the margins of imaging, beyond the GE junction. *Left IJ approach central venous catheter tip terminates at the expected location of the left brachiocephalic-caval confluence. *Telemetry leads and support devices overlie the chest. Diminishing lung volumes with globally increased consolidative and interstitial opacity throughout both lungs most coalescent right apex and left upper lung and in the medial left lung base. No visible pneumothorax or effusion. Cardiomediastinal silhouette is likely enlarged though incompletely visualized given  overlying opacity. No acute osseous or soft tissue abnormality. IMPRESSION: 1. Globally increased consolidative and interstitial opacity throughout both lungs, most coalescent right apex and left upper lung and in the medial left lung base. Possibly related to the diminished volumes/atelectasis but could also suggest worsening infection or edema. 2. Lines and tubes as above. Electronically Signed   By: Lovena Le M.D.   On: 08/16/2020 01:54   DG CHEST PORT 1 VIEW  Result Date: 08/15/2020 CLINICAL DATA:  Hypoxemia EXAM: PORTABLE CHEST 1 VIEW COMPARISON:  08/14/2020 FINDINGS: Single frontal view of the chest demonstrates stable tracheostomy tube and enteric catheter. Left internal jugular central venous catheter tip overlies brachiocephalic confluence. Artifact external to the patient limits evaluation. Cardiac silhouette is stable. Multifocal bilateral airspace disease is unchanged. No evidence of effusion or pneumothorax. No acute bony abnormalities. IMPRESSION: 1. Continued widespread bilateral airspace disease which may reflect edema or infection. 2. Support devices as above. Electronically Signed   By: Randa Ngo M.D.   On: 08/15/2020 21:40   . artificial tears  1 application Both Eyes F0X  . chlorhexidine      . chlorhexidine gluconate (MEDLINE KIT)  15 mL Mouth Rinse BID  . Chlorhexidine Gluconate Cloth  6 each Topical Daily  . docusate  100 mg Per Tube BID  . feeding supplement (PROSource TF)  45 mL Per Tube QID  . folic acid  1 mg Per Tube Daily  . heparin injection (subcutaneous)  5,000 Units Subcutaneous Q8H  . insulin aspart  0-9 Units Subcutaneous Q4H  . mouth rinse  15 mL Mouth Rinse 10 times per day  . pantoprazole (PROTONIX) IV  40 mg Intravenous QHS  . polyethylene glycol  17 g Per Tube Daily  . rocuronium bromide  10 mg Intravenous Once  . sodium chloride flush  10-40 mL Intracatheter Q12H  . thiamine  100 mg Per Tube Daily    BMET    Component Value Date/Time   NA  135 08/16/2020 0641   K 4.8 08/16/2020 0641   CL 97 (  L) 08/16/2020 0427   CO2 22 08/16/2020 0427   GLUCOSE 172 (H) 08/16/2020 0427   BUN 91 (H) 08/16/2020 0427   CREATININE 6.65 (H) 08/16/2020 0427   CALCIUM 8.4 (L) 08/16/2020 0427   GFRNONAA 9 (L) 08/16/2020 0427   CBC    Component Value Date/Time   WBC 13.4 (H) 08/16/2020 0427   RBC 3.61 (L) 08/16/2020 0427   HGB 12.2 (L) 08/16/2020 0641   HCT 36.0 (L) 08/16/2020 0641   PLT 151 08/16/2020 0427   MCV 102.8 (H) 08/16/2020 0427   MCH 33.0 08/16/2020 0427   MCHC 32.1 08/16/2020 0427   RDW 13.4 08/16/2020 0427   LYMPHSABS 3.7 07/27/2020 1805   MONOABS 1.2 (H) 08/21/2020 1805   EOSABS 0.5 08/08/2020 1805   BASOSABS 0.0 08/19/2020 1805     Assessment/Plan: 1. Oliguric AKI- presumably ischemic ATN in setting of PEA arrest requiring multiple rounds of CPR and pressors as well as contrast induced nephropathy following CT angio. Poor urine output and rapidly rising BUN/Cr.  1. Started on CRRT 08/14/20 2. All fluids 4K/2.5Ca:  Pre-filter 500 ml/hr, post-filter 300 ml/hr, Dialysate 1500 ml/hr 3. UF goal  300 ml/hr (not on pressors) 4. Anticoagulation:  none 5. Letrell continue with CRRT for several days for volume and clearance.  Then Gregary need to reassess cognitive function.  If no significant improvement would recommend transition to comfort care given poor overall prognosis for meaningful recovery (due to anoxic brain injury) 6. Left IJ temp catheter placed 08/14/20 by PCCM  2. Acute Hypoxic respiratory failure s/p emergent cric and s/p trach on vent per PCCM. Now with pneumomediastinum. Likely ARDS.  3. Metabolic Acidosis - due to #1 on CRRT 4. Acute metabolic encephalopathy- due to severe hypoxic-ischemic encephalopathy per Neuro.  Spyros plan for trial of CRRT and re-evaluate when uremia has cleared. 5. Aspiration pneumonia with sepsis on abx per PCCM   6. Atrial fibrillation with RVR- currently rate controlled. 7. Anemia of  acute illness- not an issue 8. Disposition- poor overall prognosis. Recommend palliative care consult to help set goals/limits of care.  Louis Meckel  [employer] [phone]

## 2020-08-16 NOTE — Progress Notes (Signed)
eLink Physician-Brief Progress Note Patient Name: DALESSANDRO BALDYGA DOB: 1970-10-22 MRN: [id number]   Date of Service  08/16/2020  HPI/Events of Note  Camera: Bagging for sats low 80's. Circuits, Ve.Vt appropriate. No secretions. On nimbex, breathing over the vent?Marland Kitchen  Notified bed side ICU team to evaluate at bed side soon.    eICU Interventions  - get stat CxR/ABG. ABG  - Rocuronium 10 stat. R/o pneumo.      Intervention Category Major Interventions: Respiratory failure - evaluation and management;Hypertension - evaluation and management  Ranee Gosselin 08/16/2020, 1:26 AM

## 2020-08-16 NOTE — Progress Notes (Signed)
Pt proned at 0230 with RT assistance per Dr. Deno Etienne order. Pt oxygen saturation in 70s before proning and in the 90s after proning. Proning to be continued for 16 hours.

## 2020-08-16 NOTE — Progress Notes (Signed)
eLink Physician-Brief Progress Note Patient Name: Tyler [name] DOB: [birth year] MRN: [id number]   Date of Service  08/16/2020  HPI/Events of Note  BP has significant gone up since Nimbex infusion, received 5 mg  Hydralazine earlier. SBP > 220.  Adequately sedated as per bed side RN discussion. Sats, breathing stable now. HR 90's. ECHO: unable to visualize EF as per 17 th report.    eICU Interventions  - Labetalol 10 mg IV once ordered .  Call back if not better.      Intervention Category Intermediate Interventions: Hypertension - evaluation and management  Ranee Gosselin 08/16/2020, 12:20 AM

## 2020-08-17 ENCOUNTER — Inpatient Hospital Stay (HOSPITAL_COMMUNITY): Payer: Self-pay

## 2020-08-17 DIAGNOSIS — N179 Acute kidney failure, unspecified: Secondary | ICD-10-CM

## 2020-08-17 DIAGNOSIS — N189 Chronic kidney disease, unspecified: Secondary | ICD-10-CM

## 2020-08-17 LAB — TRIGLYCERIDES: Triglycerides: 362 mg/dL — ABNORMAL HIGH (ref <150)

## 2020-08-17 LAB — POCT ACTIVATED CLOTTING TIME
Activated Clotting Time: 154 seconds
Activated Clotting Time: 154 seconds
Activated Clotting Time: 160 seconds
Activated Clotting Time: 172 seconds
Activated Clotting Time: 178 seconds
Activated Clotting Time: 160 seconds

## 2020-08-17 LAB — RENAL FUNCTION PANEL
Albumin: 2.4 g/dL — ABNORMAL LOW (ref 3.5–5.0)
Albumin: 2.4 g/dL — ABNORMAL LOW (ref 3.5–5.0)
Anion gap: 17 — ABNORMAL HIGH (ref 5–15)
Anion gap: 17 — ABNORMAL HIGH (ref 5–15)
BUN: 92 mg/dL — ABNORMAL HIGH (ref 6–20)
BUN: 110 mg/dL — ABNORMAL HIGH (ref 6–20)
CO2: 19 mmol/L — ABNORMAL LOW (ref 22–32)
CO2: 19 mmol/L — ABNORMAL LOW (ref 22–32)
Calcium: 8.8 mg/dL — ABNORMAL LOW (ref 8.9–10.3)
Calcium: 8.4 mg/dL — ABNORMAL LOW (ref 8.9–10.3)
Chloride: 100 mmol/L (ref 98–111)
Chloride: 101 mmol/L (ref 98–111)
Creatinine, Ser: 5.25 mg/dL — ABNORMAL HIGH (ref 0.61–1.24)
Creatinine, Ser: 6.24 mg/dL — ABNORMAL HIGH (ref 0.61–1.24)
GFR, Estimated: 13 mL/min — ABNORMAL LOW (ref >60)
GFR, Estimated: 10 mL/min — ABNORMAL LOW (ref >60)
Glucose, Bld: 125 mg/dL — ABNORMAL HIGH (ref 70–99)
Glucose, Bld: 122 mg/dL — ABNORMAL HIGH (ref 70–99)
Phosphorus: 4.6 mg/dL (ref 2.5–4.6)
Phosphorus: 8.6 mg/dL — ABNORMAL HIGH (ref 2.5–4.6)
Potassium: 4.2 mmol/L (ref 3.5–5.1)
Potassium: 5.4 mmol/L — ABNORMAL HIGH (ref 3.5–5.1)
Sodium: 136 mmol/L (ref 135–145)
Sodium: 137 mmol/L (ref 135–145)

## 2020-08-17 LAB — GLUCOSE, CAPILLARY
Glucose-Capillary: 102 mg/dL — ABNORMAL HIGH (ref 70–99)
Glucose-Capillary: 110 mg/dL — ABNORMAL HIGH (ref 70–99)
Glucose-Capillary: 114 mg/dL — ABNORMAL HIGH (ref 70–99)
Glucose-Capillary: 155 mg/dL — ABNORMAL HIGH (ref 70–99)
Glucose-Capillary: 160 mg/dL — ABNORMAL HIGH (ref 70–99)
Glucose-Capillary: 175 mg/dL — ABNORMAL HIGH (ref 70–99)
Glucose-Capillary: 280 mg/dL — ABNORMAL HIGH (ref 70–99)
Glucose-Capillary: 31 mg/dL — CL (ref 70–99)

## 2020-08-17 LAB — CBC
HCT: 33 % — ABNORMAL LOW (ref 39.0–52.0)
Hemoglobin: 11.2 g/dL — ABNORMAL LOW (ref 13.0–17.0)
MCH: 33.3 pg (ref 26.0–34.0)
MCHC: 33.9 g/dL (ref 30.0–36.0)
MCV: 98.2 fL (ref 80.0–100.0)
Platelets: 202 10*3/uL (ref 150–400)
RBC: 3.36 MIL/uL — ABNORMAL LOW (ref 4.22–5.81)
RDW: 13.2 % (ref 11.5–15.5)
WBC: 11 10*3/uL — ABNORMAL HIGH (ref 4.0–10.5)
nRBC: 0 % (ref 0.0–0.2)

## 2020-08-17 LAB — TROPONIN I (HIGH SENSITIVITY): Troponin I (High Sensitivity): 63 ng/L — ABNORMAL HIGH (ref <18)

## 2020-08-17 LAB — POCT I-STAT 7, (LYTES, BLD GAS, ICA,H+H)
Acid-base deficit: 2 mmol/L (ref 0.0–2.0)
Bicarbonate: 24.3 mmol/L (ref 20.0–28.0)
Calcium, Ion: 1.14 mmol/L — ABNORMAL LOW (ref 1.15–1.40)
HCT: 32 % — ABNORMAL LOW (ref 39.0–52.0)
Hemoglobin: 10.9 g/dL — ABNORMAL LOW (ref 13.0–17.0)
O2 Saturation: 100 %
Patient temperature: 37.8
Potassium: 4.5 mmol/L (ref 3.5–5.1)
Sodium: 135 mmol/L (ref 135–145)
TCO2: 26 mmol/L (ref 22–32)
pCO2 arterial: 46.6 mmHg (ref 32.0–48.0)
pH, Arterial: 7.328 — ABNORMAL LOW (ref 7.350–7.450)
pO2, Arterial: 356 mmHg — ABNORMAL HIGH (ref 83.0–108.0)

## 2020-08-17 LAB — MAGNESIUM: Magnesium: 3.1 mg/dL — ABNORMAL HIGH (ref 1.7–2.4)

## 2020-08-17 MED ORDER — METOPROLOL TARTRATE 5 MG/5ML IV SOLN: 5.0000 mg | Freq: Once | INTRAVENOUS | Status: DC

## 2020-08-17 MED ORDER — METOPROLOL TARTRATE 5 MG/5ML IV SOLN
INTRAVENOUS | Status: AC
  Administered 2020-08-17: 5 mg via INTRAVENOUS
  Filled 2020-08-17: qty 5

## 2020-08-17 MED ORDER — PHENYLEPHRINE HCL-NACL 10-0.9 MG/250ML-% IV SOLN: 0.0000 ug/min | INTRAVENOUS | Status: DC

## 2020-08-17 MED ORDER — PHENYLEPHRINE HCL-NACL 10-0.9 MG/250ML-% IV SOLN
INTRAVENOUS | Status: AC
  Administered 2020-08-17: 20 ug/min via INTRAVENOUS
  Filled 2020-08-17: qty 250

## 2020-08-17 MED ORDER — METOPROLOL TARTRATE 5 MG/5ML IV SOLN: 5.0000 mg | Freq: Once | INTRAVENOUS | Status: AC

## 2020-08-17 MED ORDER — MIDAZOLAM 50MG/50ML (1MG/ML) PREMIX INFUSION
0.0000 mg/h | INTRAVENOUS | Status: DC
  Administered 2020-08-17: 2 mg/h via INTRAVENOUS
  Administered 2020-08-18 – 2020-08-19 (×6): 10 mg/h via INTRAVENOUS
  Administered 2020-08-19: 8 mg/h via INTRAVENOUS
  Filled 2020-08-17 (×8): qty 50

## 2020-08-17 MED ORDER — PIPERACILLIN-TAZOBACTAM 3.375 G IVPB 30 MIN
3.3750 g | INTRAVENOUS | Status: AC
  Administered 2020-08-18: 3.375 g via INTRAVENOUS
  Filled 2020-08-17 (×2): qty 50

## 2020-08-17 MED ORDER — NOREPINEPHRINE 4 MG/250ML-% IV SOLN
INTRAVENOUS | Status: AC
  Administered 2020-08-17: 8 ug/min via INTRAVENOUS
  Filled 2020-08-17: qty 250

## 2020-08-17 MED ORDER — FENTANYL BOLUS VIA INFUSION
50.0000 ug | INTRAVENOUS | Status: DC | PRN
  Filled 2020-08-17: qty 50

## 2020-08-17 MED ORDER — SODIUM BICARBONATE 8.4 % IV SOLN: 50.0000 meq | Freq: Once | INTRAVENOUS | Status: AC

## 2020-08-17 MED ORDER — HEPARIN BOLUS VIA INFUSION (CRRT)
1000.0000 [IU] | INTRAVENOUS | Status: DC | PRN
  Filled 2020-08-17: qty 1000

## 2020-08-17 MED ORDER — ALTEPLASE 2 MG IJ SOLR
2.0000 mg | Freq: Once | INTRAMUSCULAR | Status: DC
  Filled 2020-08-17: qty 2

## 2020-08-17 MED ORDER — FENTANYL 2500MCG IN NS 250ML (10MCG/ML) PREMIX INFUSION
50.0000 ug/h | INTRAVENOUS | Status: DC
  Administered 2020-08-17: 175 ug/h via INTRAVENOUS
  Administered 2020-08-18 – 2020-08-20 (×5): 200 ug/h via INTRAVENOUS
  Filled 2020-08-17 (×5): qty 250

## 2020-08-17 MED ORDER — SODIUM CHLORIDE 0.9 % IV SOLN
250.0000 [IU]/h | INTRAVENOUS | Status: DC
  Administered 2020-08-17: 250 [IU]/h via INTRAVENOUS_CENTRAL
  Administered 2020-08-18: 2000 [IU]/h via INTRAVENOUS_CENTRAL
  Administered 2020-08-18: 1100 [IU]/h via INTRAVENOUS_CENTRAL
  Administered 2020-08-18: 1900 [IU]/h via INTRAVENOUS_CENTRAL
  Administered 2020-08-19: 2050 [IU]/h via INTRAVENOUS_CENTRAL
  Administered 2020-08-19: 2000 [IU]/h via INTRAVENOUS_CENTRAL
  Administered 2020-08-19 (×3): 2050 [IU]/h via INTRAVENOUS_CENTRAL
  Administered 2020-08-19: 2000 [IU]/h via INTRAVENOUS_CENTRAL
  Administered 2020-08-20: 2050 [IU]/h via INTRAVENOUS_CENTRAL
  Administered 2020-08-20 (×2): 2150 [IU]/h via INTRAVENOUS_CENTRAL
  Administered 2020-08-20: 2200 [IU]/h via INTRAVENOUS_CENTRAL
  Administered 2020-08-20: 2350 [IU]/h via INTRAVENOUS_CENTRAL
  Administered 2020-08-20: 2050 [IU]/h via INTRAVENOUS_CENTRAL
  Administered 2020-08-21 (×2): 2100 [IU]/h via INTRAVENOUS_CENTRAL
  Administered 2020-08-21: 2050 [IU]/h via INTRAVENOUS_CENTRAL
  Administered 2020-08-21: 2100 [IU]/h via INTRAVENOUS_CENTRAL
  Administered 2020-08-22: 1400 [IU]/h via INTRAVENOUS_CENTRAL
  Administered 2020-08-22: 1500 [IU]/h via INTRAVENOUS_CENTRAL
  Administered 2020-08-23 (×4): 1400 [IU]/h via INTRAVENOUS_CENTRAL
  Administered 2020-08-24 (×2): 1500 [IU]/h via INTRAVENOUS_CENTRAL
  Administered 2020-08-24 (×2): 1400 [IU]/h via INTRAVENOUS_CENTRAL
  Administered 2020-08-25: 1500 [IU]/h via INTRAVENOUS_CENTRAL
  Administered 2020-08-25: 1750 [IU]/h via INTRAVENOUS_CENTRAL
  Administered 2020-08-25: 1600 [IU]/h via INTRAVENOUS_CENTRAL
  Administered 2020-08-25: 1750 [IU]/h via INTRAVENOUS_CENTRAL
  Filled 2020-08-17 (×20): qty 2
  Filled 2020-08-17: qty 1.5
  Filled 2020-08-17 (×18): qty 2

## 2020-08-17 MED ORDER — PHENYLEPHRINE 40 MCG/ML (10ML) SYRINGE FOR IV PUSH (FOR BLOOD PRESSURE SUPPORT)
PREFILLED_SYRINGE | INTRAVENOUS | Status: AC
  Filled 2020-08-17: qty 10

## 2020-08-17 MED ORDER — ASPIRIN 81 MG PO CHEW
81.0000 mg | CHEWABLE_TABLET | Freq: Every day | ORAL | Status: DC
  Administered 2020-08-18 – 2020-08-25 (×8): 81 mg
  Filled 2020-08-17 (×9): qty 1

## 2020-08-17 MED ORDER — NOREPINEPHRINE 4 MG/250ML-% IV SOLN: 0.0000 ug/min | INTRAVENOUS | Status: DC

## 2020-08-17 MED ORDER — SODIUM BICARBONATE 8.4 % IV SOLN
INTRAVENOUS | Status: AC
  Administered 2020-08-17: 50 meq via INTRAVENOUS
  Filled 2020-08-17: qty 50

## 2020-08-17 MED ORDER — MIDAZOLAM BOLUS VIA INFUSION
1.0000 mg | INTRAVENOUS | Status: DC | PRN
  Filled 2020-08-17: qty 2

## 2020-08-17 MED ORDER — METOPROLOL TARTRATE 50 MG PO TABS
50.0000 mg | ORAL_TABLET | Freq: Two times a day (BID) | ORAL | Status: DC
  Filled 2020-08-17: qty 1

## 2020-08-17 MED ORDER — ALTEPLASE 2 MG IJ SOLR
2.0000 mg | Freq: Once | INTRAMUSCULAR | Status: AC
  Administered 2020-08-17: 2 mg
  Filled 2020-08-17: qty 2

## 2020-08-17 NOTE — Progress Notes (Signed)
Dr Isaiah Serge made aware of patient having persistent tachypnea despite continuous sedation drip and PRN sedation give. Nimbex was cut off at 1351. No new orders at this time; MD wants to continue with current plan.

## 2020-08-17 NOTE — Plan of Care (Addendum)
  Problem: Clinical Measurements: Goal: Ability to maintain clinical measurements within normal limits Dickey improve 08/17/2020 0955 by Minerva Fester, RN Outcome: Progressing   Problem: Clinical Measurements: Goal: Sanford remain free from infection 08/17/2020 0955 by Minerva Fester, RN Outcome: Progressing  Problem: Clinical Measurements: Goal: Diagnostic test results Iley improve 08/17/2020 0955 by Minerva Fester, RN Outcome: Progressing   Problem: Clinical Measurements: Goal: Cardiovascular complication Jasean be avoided 08/17/2020 0955 by Minerva Fester, RN Outcome: Progressing   Problem: Nutrition: Goal: Adequate nutrition Xander be maintained 08/17/2020 0955 by Minerva Fester, RN Outcome: Progressing 08/17/2020 0955 by Pete Pelt D, RN Outcome: Not Progressing   Problem: Elimination: Goal: Christino not experience complications related to bowel motility 08/17/2020 0955 by Minerva Fester, RN Outcome: Progressing   Problem: Elimination: Goal: Aedan not experience complications related to urinary retention 08/17/2020 0955 by Minerva Fester, RN Outcome: Progressing

## 2020-08-17 NOTE — Progress Notes (Signed)
CRRT restarted at 1300. Access pressure alarms started at 1345 and RN unable to clear alarms with multiple interventions. Currently recirculating blood. CCM made aware.

## 2020-08-17 NOTE — Progress Notes (Signed)
R IJ HD Cath reviewed on CXR, in good position.  No PTX.  OK for use.    Canary Brim, MSN, NP-C, AGACNP-BC [facility] 08/17/2020, 3:42 PM   Please see [email or web address] for pager details.

## 2020-08-17 NOTE — Progress Notes (Signed)
Called to assess patient with sinus tachycardia and hypertension.  Initial ECG computer report read acute MI, but looked more like rate related changes in setting of LVH.  Given lopressor 5 mg IV x 2 with decrease in HR from 150's to 110's.  Repeat ECG at lower heart rate not consistent with acute MI.  Marcques add scheduled lopressor 50 mg bid, and aspirin 81 mg daily.  Elbridge check cardiac enzymes.  Coralyn Helling, MD [facility] Pager - [phone] 08/17/2020, 8:40 PM

## 2020-08-17 NOTE — Progress Notes (Addendum)
eLink Physician-Brief Progress Note Patient Name: Tyler [name] DOB: December 08, 1970 MRN: [id number]   Date of Service  08/17/2020  HPI/Events of Note  Tachycardia, was previously on amiodarone  Not new and was notified to day time pccm as well  eICU Interventions  EKG now  Let us know once done      Intervention Category Major Interventions: Arrhythmia - evaluation and management  Oretha Milch 08/17/2020, 7:21 PM   7:55 pm : RN notified us EKG was done, showing STEMI per read  EKG not scanned in and issues with obtaining it in Epic HR in 140s, SBp 170 and sinus on monitor, normal Qtc Patient comfortable on sedation and paralytics Unable to review the EKG at this time, have asked Dr Craige Cotta at bedside to look at it Ordered metoprolol IV x 1   8:40 pm - Checked in on camera Was promptly seen by bedside PCCM, who evaluated EKGs Patient with better rates now after metoprolol   10 pm : Hypotension , HR mych better at 110  RN had lowered sedation as well as stopped pulling fluids via CRRT but this did not change Had been given 2 doses of IV metoprolol  MAP is 55-60, so Medhansh initiate Phenylephrine to help with BP and HR, and resume pulling fluids if tolerated D/W RN   10"30 pm  Patient had bradycardia to 50s with a couple of sinus pauses Phenylephrine had only been infusing 15 min or so, switched to levophed and another EKG is being done PCCM MD at bedside   12:40 am Notified ABG and BMP Prior ABG is not in system but that Ph was 6.9 with severe elevation in Pco2 of > 97 This current one has improved, O2 sat is 100 on PRVC VT 440, peep 16, Fio2 80, RR 32 :Reduce Fio2, repeat ABG at 3:30 am 1 gram calcium gluc and another amp Bicarb ordered RN already notified renal and the CRRT bath is being changed Check AM labs at 3:30 am as well - have discussed with RN who Keaston call with results  2 am Ventilatory dys-synchrony Already on max fentanyl and 10 mg versed drip and well  sedated, on camera PRN nimbex ordered
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
## 2020-08-17 NOTE — Progress Notes (Signed)
Patient ID: CONSUELO THAYNE, male   DOB: 1970-09-30, 49 y.o.   MRN: [id number]   S:   reasonably stable overnight.  Intubated and sedated and able to UF another 6.7 liters overnight  !! but still with high oxygen demands- CVP down to 14-  Some issues with CRRT filter-  Had to be restarted times 2- also possibly a vascath issue  O:BP 122/89   Pulse 73   Temp 99.68 F (37.6 C)   Resp (!) 30   Ht '5\' 8"'  (1.727 m)   Wt 123.4 kg   SpO2 97%   BMI 41.36 kg/m   Intake/Output Summary (Last 24 hours) at 08/17/2020 0747 Last data filed at 08/17/2020 0700 Gross per 24 hour  Intake 2646.63 ml  Output 9347 ml  Net -6700.37 ml   Intake/Output: I/O last 3 completed shifts: In: 4059 [I.V.:2209; NG/GT:1850] Out: 22297 [Urine:248; LGXQJ:19417; Stool:575]  Intake/Output this shift:  No intake/output data recorded. Weight change: -6.7 kg Gen: on vent via trach, sedated-  IJ cath placed 12/21  CVS: RRR Resp: ventilated BS bilaterally Abd: protuberant, +BS, soft Ext: 2+ edema  Recent Labs  Lab 08/10/20 1347 08/10/20 2031 07/28/2020 0428 08/22/2020 0742 08/14/20 0323 08/14/20 1703 08/14/20 1901 08/15/20 0339 08/15/20 1534 08/15/20 2120 08/16/20 0117 08/16/20 0427 08/16/20 0641 08/16/20 1548 08/17/20 0426  NA 136  --  135   < > 139   < > 137 138 139 139 136 137 135 137 136  K 4.8  --  4.1   < > 3.4*   < > 3.3* 3.6 4.0 4.3 4.3 4.7 4.8 3.9 4.2  CL 102  --  99   < > 92*  --  91* 94* 96*  --   --  97*  --  99 100  CO2 21*  --  19*   < > 23  --  '22 24 23  ' --   --  22  --  20* 19*  GLUCOSE 156*  --  146*   < > 131*  --  165* 188* 142*  --   --  172*  --  120* 125*  BUN 33*  --  53*   < > 108*  --  123* 106* 96*  --   --  91*  --  91* 92*  CREATININE 4.28*  --  5.50*   < > 10.79*  --  12.68* 10.25* 8.45*  --   --  6.65*  --  5.84* 5.25*  ALBUMIN 3.2*  --  2.8*  --  2.3*  --  2.2* 2.3* 2.4*  --   --  2.4*  2.3*  --  2.3* 2.4*  CALCIUM 8.5*  --  8.2*   < > 7.4*  --  7.3* 7.9* 8.3*  --   --  8.4*   --  8.3* 8.8*  PHOS 2.6   < > 3.3   < > 8.3*  --  8.2* 7.6* 7.1*  --   --  7.7*  --  4.0 4.6  AST 64*  --  46*  --   --   --   --   --   --   --   --  45*  --   --   --   ALT 56*  --  40  --   --   --   --   --   --   --   --  71*  --   --   --    < > =  values in this interval not displayed.   Liver Function Tests: Recent Labs  Lab 08/10/20 1347 08/14/2020 0428 08/14/20 0323 08/16/20 0427 08/16/20 1548 08/17/20 0426  AST 64* 46*  --  45*  --   --   ALT 56* 40  --  71*  --   --   ALKPHOS 55 44  --  144*  --   --   BILITOT 0.8 0.8  --  0.7  --   --   PROT 6.7 5.7*  --  7.5  --   --   ALBUMIN 3.2* 2.8*   < > 2.4*  2.3* 2.3* 2.4*   < > = values in this interval not displayed.   No results for input(s): LIPASE, AMYLASE in the last 168 hours. Recent Labs  Lab 08/10/20 2031 08/15/20 1031  AMMONIA 42* 48*   CBC: Recent Labs  Lab 08/13/20 0447 08/13/20 0809 08/14/20 0323 08/14/20 1703 08/15/20 0339 08/15/20 2120 08/16/20 0427 08/16/20 0641 08/17/20 0426  WBC 8.3  --  8.6  --  10.6*  --  13.4*  --  11.0*  HGB 10.8*   < > 10.6*   < > 11.2*   < > 11.9* 12.2* 11.2*  HCT 31.9*   < > 31.9*   < > 32.4*   < > 37.1* 36.0* 33.0*  MCV 97.9  --  99.7  --  97.6  --  102.8*  --  98.2  PLT 109*  --  118*  --  127*  --  151  --  202   < > = values in this interval not displayed.   Cardiac Enzymes: No results for input(s): CKTOTAL, CKMB, CKMBINDEX, TROPONINI in the last 168 hours. CBG: Recent Labs  Lab 08/16/20 1142 08/16/20 1552 08/16/20 1940 08/16/20 2333 08/17/20 0404  GLUCAP 85 101* 139* 155* 110*    Iron Studies: No results for input(s): IRON, TIBC, TRANSFERRIN, FERRITIN in the last 72 hours. Studies/Results: DG CHEST PORT 1 VIEW  Result Date: 08/16/2020 CLINICAL DATA:  Hypoxemia EXAM: PORTABLE CHEST 1 VIEW COMPARISON:  Radiograph 08/15/2020 FINDINGS: *Tracheostomy tube tip in the upper trachea, 6.1 cm from the carina. *Transesophageal tube tip terminates below the  margins of imaging, beyond the GE junction. *Left IJ approach central venous catheter tip terminates at the expected location of the left brachiocephalic-caval confluence. *Telemetry leads and support devices overlie the chest. Diminishing lung volumes with globally increased consolidative and interstitial opacity throughout both lungs most coalescent right apex and left upper lung and in the medial left lung base. No visible pneumothorax or effusion. Cardiomediastinal silhouette is likely enlarged though incompletely visualized given overlying opacity. No acute osseous or soft tissue abnormality. IMPRESSION: 1. Globally increased consolidative and interstitial opacity throughout both lungs, most coalescent right apex and left upper lung and in the medial left lung base. Possibly related to the diminished volumes/atelectasis but could also suggest worsening infection or edema. 2. Lines and tubes as above. Electronically Signed   By: Lovena Le M.D.   On: 08/16/2020 01:54   DG CHEST PORT 1 VIEW  Result Date: 08/15/2020 CLINICAL DATA:  Hypoxemia EXAM: PORTABLE CHEST 1 VIEW COMPARISON:  08/14/2020 FINDINGS: Single frontal view of the chest demonstrates stable tracheostomy tube and enteric catheter. Left internal jugular central venous catheter tip overlies brachiocephalic confluence. Artifact external to the patient limits evaluation. Cardiac silhouette is stable. Multifocal bilateral airspace disease is unchanged. No evidence of effusion or pneumothorax. No acute bony abnormalities. IMPRESSION: 1. Continued widespread  bilateral airspace disease which may reflect edema or infection. 2. Support devices as above. Electronically Signed   By: Randa Ngo M.D.   On: 08/15/2020 21:40   . artificial tears  1 application Both Eyes F0X  . B-complex with vitamin C  1 tablet Oral Daily  . chlorhexidine gluconate (MEDLINE KIT)  15 mL Mouth Rinse BID  . Chlorhexidine Gluconate Cloth  6 each Topical Daily  . docusate   100 mg Per Tube BID  . feeding supplement (PROSource TF)  45 mL Per Tube QID  . folic acid  1 mg Per Tube Daily  . heparin injection (subcutaneous)  5,000 Units Subcutaneous Q8H  . insulin aspart  0-9 Units Subcutaneous Q4H  . mouth rinse  15 mL Mouth Rinse 10 times per day  . pantoprazole (PROTONIX) IV  40 mg Intravenous QHS  . polyethylene glycol  17 g Per Tube Daily  . sodium chloride flush  10-40 mL Intracatheter Q12H  . thiamine  100 mg Per Tube Daily    BMET    Component Value Date/Time   NA 136 08/17/2020 0426   K 4.2 08/17/2020 0426   CL 100 08/17/2020 0426   CO2 19 (L) 08/17/2020 0426   GLUCOSE 125 (H) 08/17/2020 0426   BUN 92 (H) 08/17/2020 0426   CREATININE 5.25 (H) 08/17/2020 0426   CALCIUM 8.8 (L) 08/17/2020 0426   GFRNONAA 13 (L) 08/17/2020 0426   CBC    Component Value Date/Time   WBC 11.0 (H) 08/17/2020 0426   RBC 3.36 (L) 08/17/2020 0426   HGB 11.2 (L) 08/17/2020 0426   HCT 33.0 (L) 08/17/2020 0426   PLT 202 08/17/2020 0426   MCV 98.2 08/17/2020 0426   MCH 33.3 08/17/2020 0426   MCHC 33.9 08/17/2020 0426   RDW 13.2 08/17/2020 0426   LYMPHSABS 3.7 08/15/2020 1805   MONOABS 1.2 (H) 07/27/2020 1805   EOSABS 0.5 08/23/2020 1805   BASOSABS 0.0 08/17/2020 1805     Assessment/Plan: 1. Oliguric AKI- presumably ischemic ATN in setting of PEA arrest requiring multiple rounds of CPR and pressors as well as contrast induced nephropathy following CT angio. Poor urine output and rapidly rising BUN/Cr.  1. Started on CRRT 08/14/20 2. All fluids 4K/2.5Ca:  Pre-filter 500 ml/hr, post-filter 300 ml/hr, Dialysate 1500 ml/hr 3. UF goal  Alaster back off to 150-200 per hour since removed 13 liters last 2 days 4. Anticoagulation:  None-  Given filter issues, Shiva add heparin today  5. Saafir continue with CRRT for several days for volume and clearance.  Then Tylyn need to reassess cognitive function.  If no significant improvement would recommend transition to comfort care  given poor overall prognosis for meaningful recovery (due to anoxic brain injury) 6. Left IJ temp catheter placed 08/14/20 by PCCM  2. Acute Hypoxic respiratory failure s/p emergent cric and s/p trach on vent per PCCM. Now with pneumomediastinum. Likely ARDS. still hypoxic 3. Metabolic Acidosis - due to #1 on CRRT 4. Acute metabolic encephalopathy- due to severe hypoxic-ischemic encephalopathy per Neuro.  Kiegan plan for trial of CRRT and re-evaluate when uremia has cleared. 5. Aspiration pneumonia with sepsis on abx per PCCM   6. Atrial fibrillation with RVR- currently rate controlled. 7. Anemia of acute illness- not an issue 8. Disposition- poor overall prognosis it seems due to prolonged hypoxia    [place] [phone]

## 2020-08-17 NOTE — Progress Notes (Signed)
Pharmacy Antibiotic Note  Tyler [name] is a 49 y.o. male admitted on 08/12/2020 with asp pna.  Pharmacy has been consulted for Zosyn dosing. Pt on CRRT.  Plan: Zosyn 3.375gm IV q6h [name] f/u CRRT tolerance, micro data, and pt's clinical condition  Height: 5\' 8"  (172.7 cm) Weight: 123.4 kg (272 lb 0.8 oz) IBW/kg (Calculated) : 68.4  Temp (24hrs), Avg:100.2 F (37.9 C), Min:99.32 F (37.4 C), Max:100.94 F (38.3 C)  Recent Labs  Lab 08/13/20 0447 08/14/20 0323 08/14/20 1901 08/15/20 0339 08/15/20 1534 08/16/20 0427 08/16/20 1548 08/17/20 0426 08/17/20 1724  WBC 8.3 8.6  --  10.6*  --  13.4*  --  11.0*  --   CREATININE 9.21* 10.79*   < > 10.25* 8.45* 6.65* 5.84* 5.25* 6.24*   < > = values in this interval not displayed.    Estimated Creatinine Clearance: 18.3 mL/min (A) (by C-G formula based on SCr of 6.24 mg/dL (H)).    No Known Allergies  Antimicrobials this admission: Zosyn 12/24>> Unasyn 12/17>> 12/21 Zosyn/vanc 12/16 x1  Microbiology results: Bcx 12/16: ngtd MRSA PCR 12/17: neg Ucx 12/17: no growth BCx 12/22: ngtd Trach asp 12/22: ngtd   Thank you for allowing pharmacy to be a part of this patient's care.  1/23, PharmD, BCPS Please see amion for complete clinical pharmacist phone list 08/17/2020 11:05 PM

## 2020-08-17 NOTE — Progress Notes (Signed)
Verbal order received to wean Nimbex as tolerated

## 2020-08-17 NOTE — Progress Notes (Signed)
This RN asked CCM about train of four goals for patient. CCM stated Nimbex can remain at low dose and patient can have 4 twitches as long as ventilator is synchronous.

## 2020-08-17 NOTE — Procedures (Signed)
Central Venous Catheter Insertion Procedure Note  Tyler [name]  [id number]  1970-08-28  Date:08/17/20  Time:3:31 PM   Provider Performing:Sarthak Rubenstein Veleta Miners, NP-C, AGACNP-BC   Procedure: Insertion of Non-tunneled Central Venous Catheter(36556)with US guidance (01027)    Indication(s) Hemodialysis  Consent Risks of the procedure as well as the alternatives and risks of each were explained to the patient and/or caregiver.  Consent for the procedure was obtained and is signed in the bedside chart  Anesthesia Topical only with 1% lidocaine   Timeout Verified patient identification, verified procedure, site/side was marked, verified correct patient position, special equipment/implants available, medications/allergies/relevant history reviewed, required imaging and test results available.  Sterile Technique Maximal sterile technique including full sterile barrier drape, hand hygiene, sterile gown, sterile gloves, mask, hair covering, sterile ultrasound probe cover (if used).  Procedure Description Area of catheter insertion was cleaned with chlorhexidine and draped in sterile fashion.   With real-time ultrasound guidance a HD catheter was placed into the right internal jugular vein.  Nonpulsatile blood flow and easy flushing noted in all ports.  The catheter was sutured in place and sterile dressing applied.  Complications/Tolerance None; patient tolerated the procedure well. Chest X-ray is ordered to verify placement for internal jugular or subclavian cannulation.  Chest x-ray is not ordered for femoral cannulation.  EBL Minimal  Specimen(s) None   Tyler Brim, MSN, NP-C, AGACNP-BC [facility] 08/17/2020, 3:33 PM   Please see [email or web address] for pager details.

## 2020-08-17 NOTE — Progress Notes (Signed)
CRRT stopped due to continued Access pressure alarms for over 10 minutes. CCM at the bedside and notified of a kink in the catheter. CCM NP attempted to resuture the HD cath and gave order for IV team to TPA the line.

## 2020-08-17 NOTE — Plan of Care (Signed)
  Problem: Education: Goal: Knowledge of General Education information Kortney improve Description: Including pain rating scale, medication(s)/side effects and non-pharmacologic comfort measures Outcome: Progressing   Problem: Health Behavior/Discharge Planning: Goal: Ability to manage health-related needs Jamere improve Outcome: Progressing   Problem: Clinical Measurements: Goal: Ability to maintain clinical measurements within normal limits Christopher improve Outcome: Progressing Goal: Valen remain free from infection Outcome: Progressing Goal: Diagnostic test results Jermani improve Outcome: Progressing Goal: Respiratory complications Trice improve Outcome: Progressing Goal: Cardiovascular complication Jacen be avoided Outcome: Progressing   Problem: Activity: Goal: Risk for activity intolerance Padraig decrease Outcome: Progressing   Problem: Nutrition: Goal: Adequate nutrition Cordarro be maintained Outcome: Progressing   Problem: Elimination: Goal: Kerry not experience complications related to bowel motility Outcome: Progressing Goal: Jaedyn not experience complications related to urinary retention Outcome: Progressing   Problem: Pain Managment: Goal: General experience of comfort Islam improve Outcome: Progressing   Problem: Safety: Goal: Ability to remain free from injury Jimmey improve Outcome: Progressing   Problem: Skin Integrity: Goal: Risk for impaired skin integrity Saahil decrease Outcome: Progressing   Problem: Activity: Goal: Ability to tolerate increased activity Dashawn improve Outcome: Progressing   Problem: Respiratory: Goal: Ability to maintain a clear airway and adequate ventilation Hulet improve Outcome: Progressing   Problem: Education: Goal: Ability to manage disease process Rylie improve Outcome: Progressing   Problem: Cardiac: Goal: Ability to achieve and maintain adequate cardiopulmonary perfusion Harrie improve Outcome: Progressing   Problem: Neurologic: Goal:  Promote progressive neurologic recovery Outcome: Progressing   Problem: Skin Integrity: Goal: Risk for impaired skin integrity Virgel be minimized. Outcome: Progressing   

## 2020-08-17 NOTE — Progress Notes (Signed)
Subjective: On vent. Sedated. No trach issues.  Objective: Vital signs in last 24 hours: Temp:  [97.88 F (36.6 C)-100.22 F (37.9 C)] 99.68 F (37.6 C) (12/24 0700) Pulse Rate:  [67-81] 73 (12/24 0700) Resp:  [30] 30 (12/24 0700) BP: (111-137)/(74-89) 122/89 (12/24 0700) SpO2:  [90 %-100 %] 97 % (12/24 0700) Arterial Line BP: (119-148)/(72-85) 146/85 (12/24 0700) FiO2 (%):  [100 %] 100 % (12/24 0400) Weight:  [123.4 kg] 123.4 kg (12/24 0500)  Physical Exam: General appearance:On vent,unresponsive Head:Normocephalic, without obvious abnormality, atraumatic Ears: Examination of the ears shows normal auricles and external auditory canals bilaterally. Nose: Nasal examination shows normal mucosa, septum, turbinates.  Face: Facial examination shows no asymmetry.  Mouth:Normal mucosa. No injury. Neck:Trach tube in place. No bleeding. Neuro:Sedatedand unresponsive.  Recent Labs    08/16/20 0427 08/16/20 0641 08/17/20 0426  WBC 13.4*  --  11.0*  HGB 11.9* 12.2* 11.2*  HCT 37.1* 36.0* 33.0*  PLT 151  --  202   Recent Labs    08/16/20 1548 08/17/20 0426  NA 137 136  K 3.9 4.2  CL 99 100  CO2 20* 19*  GLUCOSE 120* 125*  BUN 91* 92*  CREATININE 5.84* 5.25*  CALCIUM 8.3* 8.8*    Medications:  I have reviewed the patient's current medications. Scheduled: . artificial tears  1 application Both Eyes C0K  . B-complex with vitamin C  1 tablet Oral Daily  . chlorhexidine gluconate (MEDLINE KIT)  15 mL Mouth Rinse BID  . Chlorhexidine Gluconate Cloth  6 each Topical Daily  . docusate  100 mg Per Tube BID  . feeding supplement (PROSource TF)  45 mL Per Tube QID  . folic acid  1 mg Per Tube Daily  . heparin injection (subcutaneous)  5,000 Units Subcutaneous Q8H  . insulin aspart  0-9 Units Subcutaneous Q4H  . mouth rinse  15 mL Mouth Rinse 10 times per day  . pantoprazole (PROTONIX) IV  40 mg Intravenous QHS  . polyethylene glycol  17 g Per Tube Daily  . sodium  chloride flush  10-40 mL Intracatheter Q12H  . thiamine  100 mg Per Tube Daily   Continuous: .  prismasol BGK 4/2.5 500 mL/hr at 08/17/20 0618  .  prismasol BGK 4/2.5 300 mL/hr at 08/16/20 1641  . cisatracurium (NIMBEX) infusion 2 mg/mL 2.5 mcg/kg/min (08/17/20 0700)  . feeding supplement (VITAL 1.5 CAL) 50 mL/hr at 08/16/20 2000  . fentaNYL infusion INTRAVENOUS 400 mcg/hr (08/17/20 0700)  . heparin 10,000 units/ 20 mL infusion syringe    . midazolam 10 mg/hr (08/17/20 0700)  . prismasol BGK 4/2.5 1,500 mL/hr at 08/17/20 0405    Assessment/Plan: POD #6s/p trach. On vent - No trach issues overnight. Venting well. - Routine fresh trach care. - Holger change trachearly next week. - Wean vent as tolerated.   LOS: 8 days   Jd Mccaster W Caralina Nop 08/17/2020, 7:55 AM

## 2020-08-17 NOTE — Progress Notes (Addendum)
Developed bradycardia with transient RBBB and hypotension.    Nimbex turned off and sedation decreased.  No longer pulling fluid from CRRT.  Changed to levophed for pressor agent.  ABG showed severe respiratory acidosis and hyperkalemia.  Only getting 300 ml for Vt on pressure control.  Changed vent settings to [facility] with goal Vt 7 cc/kg, increased RR.  Given 1 amp HCO3.  Damaree check BMET.  RN Lux contact nephrology to adjust CRRT settings.  Ayan repeat ABG in 1 hour.  Also developing fever.  Owynn add antibiotics for aspiration pneumonia.  CC time 46 minutes.  Coralyn Helling, MD [facility] Pulmonary/Critical Care Pager - [phone] 08/17/2020, 10:56 PM

## 2020-08-17 NOTE — Progress Notes (Signed)
Vent changes made by CCM MD. Edenilson obtain ABG in approximately one hour.

## 2020-08-18 ENCOUNTER — Inpatient Hospital Stay (HOSPITAL_COMMUNITY): Payer: Self-pay

## 2020-08-18 LAB — POCT I-STAT 7, (LYTES, BLD GAS, ICA,H+H)
Acid-base deficit: 1 mmol/L (ref 0.0–2.0)
Acid-base deficit: 2 mmol/L (ref 0.0–2.0)
Acid-base deficit: 6 mmol/L — ABNORMAL HIGH (ref 0.0–2.0)
Bicarbonate: 24 mmol/L (ref 20.0–28.0)
Bicarbonate: 24.8 mmol/L (ref 20.0–28.0)
Bicarbonate: 25.9 mmol/L (ref 20.0–28.0)
Calcium, Ion: 1.07 mmol/L — ABNORMAL LOW (ref 1.15–1.40)
Calcium, Ion: 1.08 mmol/L — ABNORMAL LOW (ref 1.15–1.40)
Calcium, Ion: 1.11 mmol/L — ABNORMAL LOW (ref 1.15–1.40)
HCT: 32 % — ABNORMAL LOW (ref 39.0–52.0)
HCT: 33 % — ABNORMAL LOW (ref 39.0–52.0)
HCT: 37 % — ABNORMAL LOW (ref 39.0–52.0)
Hemoglobin: 10.9 g/dL — ABNORMAL LOW (ref 13.0–17.0)
Hemoglobin: 11.2 g/dL — ABNORMAL LOW (ref 13.0–17.0)
Hemoglobin: 12.6 g/dL — ABNORMAL LOW (ref 13.0–17.0)
O2 Saturation: 100 %
O2 Saturation: 100 %
O2 Saturation: 95 %
Patient temperature: 36.9
Patient temperature: 36.9
Patient temperature: 37.1
Potassium: 6.3 mmol/L (ref 3.5–5.1)
Potassium: 6.6 mmol/L (ref 3.5–5.1)
Potassium: 7.1 mmol/L (ref 3.5–5.1)
Sodium: 132 mmol/L — ABNORMAL LOW (ref 135–145)
Sodium: 134 mmol/L — ABNORMAL LOW (ref 135–145)
Sodium: 134 mmol/L — ABNORMAL LOW (ref 135–145)
TCO2: 26 mmol/L (ref 22–32)
TCO2: 26 mmol/L (ref 22–32)
TCO2: 27 mmol/L (ref 22–32)
pCO2 arterial: 49.2 mmHg — ABNORMAL HIGH (ref 32.0–48.0)
pCO2 arterial: 50.9 mmHg — ABNORMAL HIGH (ref 32.0–48.0)
pCO2 arterial: 71.9 mmHg (ref 32.0–48.0)
pH, Arterial: 7.132 — CL (ref 7.350–7.450)
pH, Arterial: 7.309 — ABNORMAL LOW (ref 7.350–7.450)
pH, Arterial: 7.314 — ABNORMAL LOW (ref 7.350–7.450)
pO2, Arterial: 104 mmHg (ref 83.0–108.0)
pO2, Arterial: 239 mmHg — ABNORMAL HIGH (ref 83.0–108.0)
pO2, Arterial: 255 mmHg — ABNORMAL HIGH (ref 83.0–108.0)

## 2020-08-18 LAB — CULTURE, RESPIRATORY W GRAM STAIN

## 2020-08-18 LAB — CBC
HCT: 33.8 % — ABNORMAL LOW (ref 39.0–52.0)
Hemoglobin: 11 g/dL — ABNORMAL LOW (ref 13.0–17.0)
MCH: 33.4 pg (ref 26.0–34.0)
MCHC: 32.5 g/dL (ref 30.0–36.0)
MCV: 102.7 fL — ABNORMAL HIGH (ref 80.0–100.0)
Platelets: 231 10*3/uL (ref 150–400)
RBC: 3.29 MIL/uL — ABNORMAL LOW (ref 4.22–5.81)
RDW: 12.6 % (ref 11.5–15.5)
WBC: 19.3 10*3/uL — ABNORMAL HIGH (ref 4.0–10.5)
nRBC: 0.2 % (ref 0.0–0.2)

## 2020-08-18 LAB — RENAL FUNCTION PANEL
Albumin: 2.5 g/dL — ABNORMAL LOW (ref 3.5–5.0)
Albumin: 2.6 g/dL — ABNORMAL LOW (ref 3.5–5.0)
Anion gap: 15 (ref 5–15)
Anion gap: 16 — ABNORMAL HIGH (ref 5–15)
BUN: 100 mg/dL — ABNORMAL HIGH (ref 6–20)
BUN: 91 mg/dL — ABNORMAL HIGH (ref 6–20)
CO2: 21 mmol/L — ABNORMAL LOW (ref 22–32)
CO2: 21 mmol/L — ABNORMAL LOW (ref 22–32)
Calcium: 8.7 mg/dL — ABNORMAL LOW (ref 8.9–10.3)
Calcium: 8.8 mg/dL — ABNORMAL LOW (ref 8.9–10.3)
Chloride: 99 mmol/L (ref 98–111)
Chloride: 97 mmol/L — ABNORMAL LOW (ref 98–111)
Creatinine, Ser: 5.49 mg/dL — ABNORMAL HIGH (ref 0.61–1.24)
Creatinine, Ser: 4.54 mg/dL — ABNORMAL HIGH (ref 0.61–1.24)
GFR, Estimated: 12 mL/min — ABNORMAL LOW (ref >60)
GFR, Estimated: 15 mL/min — ABNORMAL LOW (ref >60)
Glucose, Bld: 194 mg/dL — ABNORMAL HIGH (ref 70–99)
Glucose, Bld: 156 mg/dL — ABNORMAL HIGH (ref 70–99)
Phosphorus: 8.3 mg/dL — ABNORMAL HIGH (ref 2.5–4.6)
Phosphorus: 6.8 mg/dL — ABNORMAL HIGH (ref 2.5–4.6)
Potassium: 6.6 mmol/L (ref 3.5–5.1)
Potassium: 5 mmol/L (ref 3.5–5.1)
Sodium: 135 mmol/L (ref 135–145)
Sodium: 134 mmol/L — ABNORMAL LOW (ref 135–145)

## 2020-08-18 LAB — POCT ACTIVATED CLOTTING TIME
Activated Clotting Time: 160 seconds
Activated Clotting Time: 160 seconds
Activated Clotting Time: 160 seconds
Activated Clotting Time: 166 seconds
Activated Clotting Time: 166 seconds
Activated Clotting Time: 172 seconds
Activated Clotting Time: 172 seconds
Activated Clotting Time: 172 seconds
Activated Clotting Time: 172 seconds
Activated Clotting Time: 178 seconds
Activated Clotting Time: 178 seconds
Activated Clotting Time: 178 seconds
Activated Clotting Time: 178 seconds
Activated Clotting Time: 178 seconds
Activated Clotting Time: 178 seconds
Activated Clotting Time: 178 seconds
Activated Clotting Time: 178 seconds
Activated Clotting Time: 184 seconds
Activated Clotting Time: 190 seconds
Activated Clotting Time: 190 seconds
Activated Clotting Time: 190 seconds
Activated Clotting Time: 207 seconds
Activated Clotting Time: 208 seconds

## 2020-08-18 LAB — APTT: aPTT: 41 seconds — ABNORMAL HIGH (ref 24–36)

## 2020-08-18 LAB — MAGNESIUM
Magnesium: 3.4 mg/dL — ABNORMAL HIGH (ref 1.7–2.4)
Magnesium: 3.3 mg/dL — ABNORMAL HIGH (ref 1.7–2.4)

## 2020-08-18 LAB — BASIC METABOLIC PANEL
Anion gap: 15 (ref 5–15)
BUN: 105 mg/dL — ABNORMAL HIGH (ref 6–20)
CO2: 24 mmol/L (ref 22–32)
Calcium: 8.1 mg/dL — ABNORMAL LOW (ref 8.9–10.3)
Chloride: 96 mmol/L — ABNORMAL LOW (ref 98–111)
Creatinine, Ser: 6.13 mg/dL — ABNORMAL HIGH (ref 0.61–1.24)
GFR, Estimated: 10 mL/min — ABNORMAL LOW (ref >60)
Glucose, Bld: 293 mg/dL — ABNORMAL HIGH (ref 70–99)
Potassium: 7.3 mmol/L (ref 3.5–5.1)
Sodium: 135 mmol/L (ref 135–145)

## 2020-08-18 LAB — GLUCOSE, CAPILLARY
Glucose-Capillary: 144 mg/dL — ABNORMAL HIGH (ref 70–99)
Glucose-Capillary: 150 mg/dL — ABNORMAL HIGH (ref 70–99)
Glucose-Capillary: 160 mg/dL — ABNORMAL HIGH (ref 70–99)

## 2020-08-18 LAB — TROPONIN I (HIGH SENSITIVITY): Troponin I (High Sensitivity): 99 ng/L — ABNORMAL HIGH (ref <18)

## 2020-08-18 MED ORDER — PIPERACILLIN-TAZOBACTAM 3.375 G IVPB: 3.3750 g | Freq: Four times a day (QID) | INTRAVENOUS | Status: DC

## 2020-08-18 MED ORDER — SODIUM BICARBONATE 8.4 % IV SOLN
50.0000 meq | Freq: Once | INTRAVENOUS | Status: AC
  Administered 2020-08-18: 50 meq via INTRAVENOUS
  Filled 2020-08-18: qty 50

## 2020-08-18 MED ORDER — PRISMASOL BGK 0/2.5 32-2.5 MEQ/L EC SOLN
Status: DC
  Filled 2020-08-18 (×14): qty 5000

## 2020-08-18 MED ORDER — ARTIFICIAL TEARS OPHTHALMIC OINT
1.0000 "application " | TOPICAL_OINTMENT | Freq: Three times a day (TID) | OPHTHALMIC | Status: DC
  Administered 2020-08-18 – 2020-08-20 (×8): 1 via OPHTHALMIC

## 2020-08-18 MED ORDER — PRISMASOL BGK 0/2.5 32-2.5 MEQ/L EC SOLN
Status: DC
  Filled 2020-08-18 (×4): qty 5000

## 2020-08-18 MED ORDER — CISATRACURIUM BESYLATE 20 MG/10ML IV SOLN
0.1000 mg/kg | INTRAVENOUS | Status: DC | PRN
  Administered 2020-08-18 (×2): 12.4 mg via INTRAVENOUS
  Filled 2020-08-18 (×2): qty 10

## 2020-08-18 MED ORDER — PIPERACILLIN-TAZOBACTAM 3.375 G IVPB 30 MIN
3.3750 g | Freq: Four times a day (QID) | INTRAVENOUS | Status: AC
  Administered 2020-08-18 – 2020-08-23 (×20): 3.375 g via INTRAVENOUS
  Filled 2020-08-18 (×37): qty 50

## 2020-08-18 MED ORDER — CALCIUM GLUCONATE-NACL 1-0.675 GM/50ML-% IV SOLN
1.0000 g | Freq: Once | INTRAVENOUS | Status: AC
  Administered 2020-08-18: 1000 mg via INTRAVENOUS
  Filled 2020-08-18: qty 50
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
## 2020-08-18 NOTE — Progress Notes (Signed)
Removed trach sutures without complication. RN at bedside.

## 2020-08-18 NOTE — Plan of Care (Signed)
  Problem: Education: Goal: Knowledge of General Education information Tyler [name] improve Description: Including pain rating scale, medication(s)/side effects and non-pharmacologic comfort measures Outcome: Progressing   Problem: Health Behavior/Discharge Planning: Goal: Ability to manage health-related needs Tyler [name] improve Outcome: Progressing   Problem: Clinical Measurements: Goal: Ability to maintain clinical measurements within normal limits Tyler [name] improve Outcome: Progressing Goal: Tyler [name] remain free from infection Outcome: Progressing Goal: Diagnostic test results Tyler [name] improve Outcome: Progressing Goal: Respiratory complications Tyler [name] improve Outcome: Progressing Goal: Cardiovascular complication Tyler [name] be avoided Outcome: Progressing   Problem: Activity: Goal: Risk for activity intolerance Tyler [name] decrease Outcome: Progressing   Problem: Nutrition: Goal: Adequate nutrition Tyler [name] be maintained Outcome: Progressing   Problem: Elimination: Goal: Tyler [name] not experience complications related to bowel motility Outcome: Progressing Goal: Tyler [name] not experience complications related to urinary retention Outcome: Progressing   Problem: Pain Managment: Goal: General experience of comfort Tyler [name] improve Outcome: Progressing   Problem: Safety: Goal: Ability to remain free from injury Tyler [name] improve Outcome: Progressing   Problem: Skin Integrity: Goal: Risk for impaired skin integrity Tyler [name] decrease Outcome: Progressing   Problem: Activity: Goal: Ability to tolerate increased activity Tyler [name] improve Outcome: Progressing   Problem: Respiratory: Goal: Ability to maintain a clear airway and adequate ventilation Tyler [name] improve Outcome: Progressing   Problem: Education: Goal: Ability to manage disease process Tyler [name] improve Outcome: Progressing   Problem: Cardiac: Goal: Ability to achieve and maintain adequate cardiopulmonary perfusion Tyler [name] improve Outcome: Progressing   Problem: Neurologic: Goal:  Promote progressive neurologic recovery Outcome: Progressing   Problem: Skin Integrity: Goal: Risk for impaired skin integrity Tyler [name] be minimized. Outcome: Progressing   

## 2020-08-18 NOTE — Progress Notes (Signed)
Patient ID: Tyler [name], male   DOB: [birth year], 49 y.o.   MRN: [id number]   S:   Had to have dialysis catheter replaced yesterday- then got febrile and tachy- had resp acidosis- things adjusted better this AM-  K also rose, got as high as  7.3- dialysate bags changed to 2 K-  At 6.3 this AM-  Overall negative 2100-  FIO2 down to 50 !!!  O:BP 113/87   Pulse 79   Temp 97.88 F (36.6 C)   Resp 17   Ht '5\' 8"'  (1.727 m)   Wt 120.1 kg   SpO2 100%   BMI 40.26 kg/m   Intake/Output Summary (Last 24 hours) at 08/18/2020 0823 Last data filed at 08/18/2020 0800 Gross per 24 hour  Intake 2800.18 ml  Output 4877 ml  Net -2076.82 ml   Intake/Output: I/O last 3 completed shifts: In: 4288.6 [I.V.:1938.7; NG/GT:2100; IV Piggyback:249.9] Out: 6979 [Urine:215; YIAXK:5537; Stool:725]  Intake/Output this shift:  Total I/O In: 35.7 [I.V.:35.7] Out: 270 [Other:270] Weight change: -3.3 kg Gen: on vent via trach, sedated-  IJ cath placed 12/21  CVS: RRR Resp: ventilated BS bilaterally Abd: protuberant, +BS, soft Ext: 2+ edema  Recent Labs  Lab 08/15/20 0339 08/15/20 1534 08/15/20 2120 08/16/20 0427 08/16/20 0641 08/16/20 1548 08/17/20 0426 08/17/20 1031 08/17/20 1724 08/17/20 2239 08/17/20 2356 08/18/20 0351 08/18/20 0355 08/18/20 0415  NA 138 139   < > 137   < > 137 136 135 137 135 132* 134* 135 134*  K 3.6 4.0   < > 4.7   < > 3.9 4.2 4.5 5.4* 7.3* 7.1* 6.6* 6.6* 6.3*  CL 94* 96*  --  97*  --  99 100  --  101 96*  --   --  99  --   CO2 24 23  --  22  --  20* 19*  --  19* 24  --   --  21*  --   GLUCOSE 188* 142*  --  172*  --  120* 125*  --  122* 293*  --   --  194*  --   BUN 106* 96*  --  91*  --  91* 92*  --  110* 105*  --   --  100*  --   CREATININE 10.25* 8.45*  --  6.65*  --  5.84* 5.25*  --  6.24* 6.13*  --   --  5.49*  --   ALBUMIN 2.3* 2.4*  --  2.4*  2.3*  --  2.3* 2.4*  --  2.4*  --   --   --  2.5*  --   CALCIUM 7.9* 8.3*  --  8.4*  --  8.3* 8.8*  --  8.4* 8.1*  --   --   8.7*  --   PHOS 7.6* 7.1*  --  7.7*  --  4.0 4.6  --  8.6*  --   --   --  8.3*  --   AST  --   --   --  45*  --   --   --   --   --   --   --   --   --   --   ALT  --   --   --  71*  --   --   --   --   --   --   --   --   --   --    < > =  values in this interval not displayed.   Liver Function Tests: Recent Labs  Lab 08/16/20 0427 08/16/20 1548 08/17/20 0426 08/17/20 1724 08/18/20 0355  AST 45*  --   --   --   --   ALT 71*  --   --   --   --   ALKPHOS 144*  --   --   --   --   BILITOT 0.7  --   --   --   --   PROT 7.5  --   --   --   --   ALBUMIN 2.4*  2.3*   < > 2.4* 2.4* 2.5*   < > = values in this interval not displayed.   No results for input(s): LIPASE, AMYLASE in the last 168 hours. Recent Labs  Lab 08/15/20 1031  AMMONIA 48*   CBC: Recent Labs  Lab 08/14/20 0323 08/14/20 1703 08/15/20 0339 08/15/20 2120 08/16/20 0427 08/16/20 0641 08/17/20 0426 08/17/20 1031 08/18/20 0351 08/18/20 0355 08/18/20 0415  WBC 8.6  --  10.6*  --  13.4*  --  11.0*  --   --  19.3*  --   HGB 10.6*   < > 11.2*   < > 11.9*   < > 11.2*   < > 11.2* 11.0* 10.9*  HCT 31.9*   < > 32.4*   < > 37.1*   < > 33.0*   < > 33.0* 33.8* 32.0*  MCV 99.7  --  97.6  --  102.8*  --  98.2  --   --  102.7*  --   PLT 118*  --  127*  --  151  --  202  --   --  231  --    < > = values in this interval not displayed.   Cardiac Enzymes: No results for input(s): CKTOTAL, CKMB, CKMBINDEX, TROPONINI in the last 168 hours. CBG: Recent Labs  Lab 08/17/20 1103 08/17/20 1658 08/17/20 1919 08/17/20 2355 08/18/20 0749  GLUCAP 160* 102* 175* 280* 144*    Iron Studies: No results for input(s): IRON, TIBC, TRANSFERRIN, FERRITIN in the last 72 hours. Studies/Results: DG CHEST PORT 1 VIEW  Result Date: 08/17/2020 CLINICAL DATA:  Right CVL placement EXAM: PORTABLE CHEST 1 VIEW COMPARISON:  08/16/2020 FINDINGS: Interval placement of right neck vascular catheter, tip projecting over the superior vena cava near  the brachiocephalic confluence. Left-sided vascular catheter remains in position, tip in the similar location. Tracheostomy. Improved aeration of the lungs with persistent bibasilar heterogeneous airspace opacity. Cardiomegaly. Partially imaged enteric tube. IMPRESSION: 1. Interval placement of right neck vascular catheter, tip projecting over the superior vena cava near the brachiocephalic confluence. 2. Improved aeration of the lungs with persistent bibasilar heterogeneous airspace opacity, generally consistent with multifocal infection. Electronically Signed   By: Eddie Candle M.D.   On: 08/17/2020 15:43   . alteplase  2 mg Intracatheter Once  . artificial tears  1 application Both Eyes O8C  . aspirin  81 mg Per Tube Daily  . B-complex with vitamin C  1 tablet Oral Daily  . chlorhexidine gluconate (MEDLINE KIT)  15 mL Mouth Rinse BID  . Chlorhexidine Gluconate Cloth  6 each Topical Daily  . feeding supplement (PROSource TF)  45 mL Per Tube QID  . folic acid  1 mg Per Tube Daily  . heparin injection (subcutaneous)  5,000 Units Subcutaneous Q8H  . insulin aspart  0-9 Units Subcutaneous Q4H  . mouth rinse  15 mL Mouth  Rinse 10 times per day  . metoprolol tartrate  5 mg Intravenous Once  . pantoprazole (PROTONIX) IV  40 mg Intravenous QHS  . phenylephrine      . sodium chloride flush  10-40 mL Intracatheter Q12H  . thiamine  100 mg Per Tube Daily    BMET    Component Value Date/Time   NA 134 (L) 08/18/2020 0415   K 6.3 (HH) 08/18/2020 0415   CL 99 08/18/2020 0355   CO2 21 (L) 08/18/2020 0355   GLUCOSE 194 (H) 08/18/2020 0355   BUN 100 (H) 08/18/2020 0355   CREATININE 5.49 (H) 08/18/2020 0355   CALCIUM 8.7 (L) 08/18/2020 0355   GFRNONAA 12 (L) 08/18/2020 0355   CBC    Component Value Date/Time   WBC 19.3 (H) 08/18/2020 0355   RBC 3.29 (L) 08/18/2020 0355   HGB 10.9 (L) 08/18/2020 0415   HCT 32.0 (L) 08/18/2020 0415   PLT 231 08/18/2020 0355   MCV 102.7 (H) 08/18/2020 0355    MCH 33.4 08/18/2020 0355   MCHC 32.5 08/18/2020 0355   RDW 12.6 08/18/2020 0355   LYMPHSABS 3.7 08/03/2020 1805   MONOABS 1.2 (H) 08/05/2020 1805   EOSABS 0.5 07/28/2020 1805   BASOSABS 0.0 07/28/2020 1805     Assessment/Plan: 1. Oliguric AKI- presumably ischemic ATN in setting of PEA arrest requiring multiple rounds of CPR and pressors as well as contrast induced nephropathy following CT angio.   1. Started on CRRT 08/14/20 2. All fluids 2K (changed from 4 k on 12/25)/2.5Ca:  Pre-filter 500 ml/hr, post-filter 300 ml/hr, Dialysate 1500 ml/hr 3. UF goal   backed off to 150-200 per hour since removed 13 liters from 12/22-12/24 and fio2 is better  4. Anticoagulation:   add heparin on 12/24 5. Welborn continue with CRRT for several days for volume and clearance.  Then Jachob need to reassess cognitive function.  If no significant improvement would recommend transition to comfort care given poor overall prognosis for meaningful recovery (due to anoxic brain injury) 6. Left IJ temp catheter placed 08/14/20 by PCCM  2. Acute Hypoxic respiratory failure s/p emergent cric and s/p trach on vent per PCCM. Now with pneumomediastinum. Likely ARDS. still hypoxic 3. Metabolic Acidosis - due to #1 on CRRT 4. Acute metabolic encephalopathy- due to severe hypoxic-ischemic encephalopathy per Neuro.  Shepard plan for trial of CRRT and re-evaluate when uremia has cleared. 5. Aspiration pneumonia with sepsis on abx per PCCM   6. Atrial fibrillation with RVR- currently rate controlled. 7. Anemia of acute illness- not an issue 8. Disposition- poor overall prognosis it seems due to prolonged hypoxia   9. Hyperkalemia-  New issue-  Unclear exactly why as had been stable for days, did have signficant acidosis which is improved-  Now on 2 K bath and is correcting  Tyler [name]  Tyler [name] [phone]

## 2020-08-19 ENCOUNTER — Inpatient Hospital Stay (HOSPITAL_COMMUNITY): Payer: Self-pay

## 2020-08-19 LAB — GLUCOSE, CAPILLARY
Glucose-Capillary: 157 mg/dL — ABNORMAL HIGH (ref 70–99)
Glucose-Capillary: 128 mg/dL — ABNORMAL HIGH (ref 70–99)
Glucose-Capillary: 186 mg/dL — ABNORMAL HIGH (ref 70–99)
Glucose-Capillary: 163 mg/dL — ABNORMAL HIGH (ref 70–99)
Glucose-Capillary: 160 mg/dL — ABNORMAL HIGH (ref 70–99)
Glucose-Capillary: 135 mg/dL — ABNORMAL HIGH (ref 70–99)
Glucose-Capillary: 151 mg/dL — ABNORMAL HIGH (ref 70–99)

## 2020-08-19 LAB — RENAL FUNCTION PANEL
Albumin: 2.7 g/dL — ABNORMAL LOW (ref 3.5–5.0)
Albumin: 2.8 g/dL — ABNORMAL LOW (ref 3.5–5.0)
Anion gap: 16 — ABNORMAL HIGH (ref 5–15)
Anion gap: 15 (ref 5–15)
BUN: 86 mg/dL — ABNORMAL HIGH (ref 6–20)
BUN: 78 mg/dL — ABNORMAL HIGH (ref 6–20)
CO2: 23 mmol/L (ref 22–32)
CO2: 22 mmol/L (ref 22–32)
Calcium: 9.1 mg/dL (ref 8.9–10.3)
Calcium: 9.2 mg/dL (ref 8.9–10.3)
Chloride: 96 mmol/L — ABNORMAL LOW (ref 98–111)
Chloride: 97 mmol/L — ABNORMAL LOW (ref 98–111)
Creatinine, Ser: 4.13 mg/dL — ABNORMAL HIGH (ref 0.61–1.24)
Creatinine, Ser: 3.87 mg/dL — ABNORMAL HIGH (ref 0.61–1.24)
GFR, Estimated: 17 mL/min — ABNORMAL LOW (ref >60)
GFR, Estimated: 18 mL/min — ABNORMAL LOW (ref >60)
Glucose, Bld: 158 mg/dL — ABNORMAL HIGH (ref 70–99)
Glucose, Bld: 142 mg/dL — ABNORMAL HIGH (ref 70–99)
Phosphorus: 7 mg/dL — ABNORMAL HIGH (ref 2.5–4.6)
Phosphorus: 6.8 mg/dL — ABNORMAL HIGH (ref 2.5–4.6)
Potassium: 4.4 mmol/L (ref 3.5–5.1)
Potassium: 4.4 mmol/L (ref 3.5–5.1)
Sodium: 135 mmol/L (ref 135–145)
Sodium: 134 mmol/L — ABNORMAL LOW (ref 135–145)

## 2020-08-19 LAB — CBC
HCT: 35.3 % — ABNORMAL LOW (ref 39.0–52.0)
Hemoglobin: 11.6 g/dL — ABNORMAL LOW (ref 13.0–17.0)
MCH: 33 pg (ref 26.0–34.0)
MCHC: 32.9 g/dL (ref 30.0–36.0)
MCV: 100.3 fL — ABNORMAL HIGH (ref 80.0–100.0)
Platelets: 320 10*3/uL (ref 150–400)
RBC: 3.52 MIL/uL — ABNORMAL LOW (ref 4.22–5.81)
RDW: 12.5 % (ref 11.5–15.5)
WBC: 19.3 10*3/uL — ABNORMAL HIGH (ref 4.0–10.5)
nRBC: 0 % (ref 0.0–0.2)

## 2020-08-19 LAB — POCT ACTIVATED CLOTTING TIME
Activated Clotting Time: 184 seconds
Activated Clotting Time: 190 seconds
Activated Clotting Time: 190 seconds
Activated Clotting Time: 190 seconds
Activated Clotting Time: 190 seconds
Activated Clotting Time: 190 seconds
Activated Clotting Time: 190 seconds
Activated Clotting Time: 196 seconds
Activated Clotting Time: 196 seconds
Activated Clotting Time: 196 seconds
Activated Clotting Time: 202 seconds
Activated Clotting Time: 208 seconds
Activated Clotting Time: 208 seconds
Activated Clotting Time: 208 seconds
Activated Clotting Time: 213 seconds
Activated Clotting Time: 213 seconds
Activated Clotting Time: 214 seconds
Activated Clotting Time: 214 seconds
Activated Clotting Time: 220 seconds

## 2020-08-19 LAB — APTT: aPTT: 71 seconds — ABNORMAL HIGH (ref 24–36)

## 2020-08-19 LAB — MAGNESIUM: Magnesium: 3.2 mg/dL — ABNORMAL HIGH (ref 1.7–2.4)

## 2020-08-19 MED ORDER — PRISMASOL BGK 4/2.5 32-4-2.5 MEQ/L REPLACEMENT SOLN: Status: DC

## 2020-08-19 MED ORDER — PRISMASOL BGK 4/2.5 32-4-2.5 MEQ/L EC SOLN: Status: DC
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
## 2020-08-19 NOTE — Progress Notes (Signed)
Patient ID: Tyler [name], male   DOB: 05/13/1971, 49 y.o.   MRN: [id number]   S:   More stable overnight-  K down to 4.4- was negative another almost 5 liters- FIO2 down to 40 %- BP now even a little low   O:BP 105/75   Pulse 76   Temp 97.88 F (36.6 C)   Resp (!) 28   Ht _0  (1.727 m)   Wt 116.9 kg   SpO2 99%   BMI 39.19 kg/m   Intake/Output Summary (Last 24 hours) at 08/19/2020 0810 Last data filed at 08/19/2020 0800 Gross per 24 hour  Intake 2185.86 ml  Output 7093 ml  Net -4907.14 ml   Intake/Output: I/O last 3 completed shifts: In: 3572.5 [I.V.:1342.5; NG/GT:1800; IV ONGEXBMWU:132] Out: 44010 [Urine:100; UVOZD:6644; Stool:500]  Intake/Output this shift:  Total I/O In: 106.6 [I.V.:36.6; NG/GT:50; IV Piggyback:20] Out: 378 [Other:278; Stool:100] Weight change: -3.2 kg Gen: on vent via trach, sedated-  IJ cath placed 12/21  CVS: RRR Resp: ventilated BS bilaterally Abd: protuberant, +BS, soft Ext: 2+ edema  Recent Labs  Lab 08/16/20 0427 08/16/20 0641 08/16/20 1548 08/17/20 0426 08/17/20 1031 08/17/20 1724 08/17/20 2239 08/17/20 2356 08/18/20 0351 08/18/20 0355 08/18/20 0415 08/18/20 1605 08/19/20 0428  NA 137   < > 137 136   < > 137 135 132* 134* 135 134* 134* 135  K 4.7   < > 3.9 4.2   < > 5.4* 7.3* 7.1* 6.6* 6.6* 6.3* 5.0 4.4  CL 97*  --  99 100  --  101 96*  --   --  99  --  97* 96*  CO2 22  --  20* 19*  --  19* 24  --   --  21*  --  21* 23  GLUCOSE 172*  --  120* 125*  --  122* 293*  --   --  194*  --  156* 158*  BUN 91*  --  91* 92*  --  110* 105*  --   --  100*  --  91* 86*  CREATININE 6.65*  --  5.84* 5.25*  --  6.24* 6.13*  --   --  5.49*  --  4.54* 4.13*  ALBUMIN 2.4*  2.3*  --  2.3* 2.4*  --  2.4*  --   --   --  2.5*  --  2.6* 2.7*  CALCIUM 8.4*  --  8.3* 8.8*  --  8.4* 8.1*  --   --  8.7*  --  8.8* 9.1  PHOS 7.7*  --  4.0 4.6  --  8.6*  --   --   --  8.3*  --  6.8* 7.0*  AST 45*  --   --   --   --   --   --   --   --   --   --   --   --    ALT 71*  --   --   --   --   --   --   --   --   --   --   --   --    < > = values in this interval not displayed.   Liver Function Tests: Recent Labs  Lab 08/16/20 0427 08/16/20 1548 08/18/20 0355 08/18/20 1605 08/19/20 0428  AST 45*  --   --   --   --   ALT 71*  --   --   --   --  ALKPHOS 144*  --   --   --   --   BILITOT 0.7  --   --   --   --   PROT 7.5  --   --   --   --   ALBUMIN 2.4*  2.3*   < > 2.5* 2.6* 2.7*   < > = values in this interval not displayed.   No results for input(s): LIPASE, AMYLASE in the last 168 hours. Recent Labs  Lab 08/15/20 1031  AMMONIA 48*   CBC: Recent Labs  Lab 08/15/20 0339 08/15/20 2120 08/16/20 0427 08/16/20 0641 08/17/20 0426 08/17/20 1031 08/18/20 0355 08/18/20 0415 08/19/20 0429  WBC 10.6*  --  13.4*  --  11.0*  --  19.3*  --  19.3*  HGB 11.2*   < > 11.9*   < > 11.2*   < > 11.0* 10.9* 11.6*  HCT 32.4*   < > 37.1*   < > 33.0*   < > 33.8* 32.0* 35.3*  MCV 97.6  --  102.8*  --  98.2  --  102.7*  --  100.3*  PLT 127*  --  151  --  202  --  231  --  320   < > = values in this interval not displayed.   Cardiac Enzymes: No results for input(s): CKTOTAL, CKMB, CKMBINDEX, TROPONINI in the last 168 hours. CBG: Recent Labs  Lab 08/18/20 0749 08/18/20 1149 08/18/20 1956 08/18/20 2350 08/19/20 0408  GLUCAP 144* 150* 160* 157* 128*    Iron Studies: No results for input(s): IRON, TIBC, TRANSFERRIN, FERRITIN in the last 72 hours. Studies/Results: DG Chest Port 1 View  Result Date: 08/19/2020 CLINICAL DATA:  Acute respiratory failure. EXAM: PORTABLE CHEST 1 VIEW COMPARISON:  08/18/2020 FINDINGS: 0549 hours. Tracheostomy tube again noted. A feeding tube passes into the stomach although the distal tip position is not included on the film. Right IJ central line tip projects over the proximal SVC level. Low volume film with enlargement of the cardiopericardial silhouette and vascular congestion. Associated component of interstitial  edema not excluded. Streaky opacity in the bases suggest atelectasis. No substantial pleural effusion. IMPRESSION: 1. Low volume film with enlargement of the cardiopericardial silhouette and vascular congestion. 2. Bibasilar atelectasis. Electronically Signed   By: Misty Stanley M.D.   On: 08/19/2020 06:02   DG CHEST PORT 1 VIEW  Result Date: 08/18/2020 CLINICAL DATA:  Respiratory failure. EXAM: PORTABLE CHEST 1 VIEW COMPARISON:  08/17/2020 FINDINGS: 0646 hours. The cardio pericardial silhouette is enlarged. There is pulmonary vascular congestion without overt pulmonary edema. Tracheostomy tube again noted. Right IJ central line largely obscured by telemetry wires. IMPRESSION: Stable exam. Cardiomegaly with pulmonary vascular congestion. Electronically Signed   By: Misty Stanley M.D.   On: 08/18/2020 08:39   DG CHEST PORT 1 VIEW  Result Date: 08/17/2020 CLINICAL DATA:  Right CVL placement EXAM: PORTABLE CHEST 1 VIEW COMPARISON:  08/16/2020 FINDINGS: Interval placement of right neck vascular catheter, tip projecting over the superior vena cava near the brachiocephalic confluence. Left-sided vascular catheter remains in position, tip in the similar location. Tracheostomy. Improved aeration of the lungs with persistent bibasilar heterogeneous airspace opacity. Cardiomegaly. Partially imaged enteric tube. IMPRESSION: 1. Interval placement of right neck vascular catheter, tip projecting over the superior vena cava near the brachiocephalic confluence. 2. Improved aeration of the lungs with persistent bibasilar heterogeneous airspace opacity, generally consistent with multifocal infection. Electronically Signed   By: Eddie Candle M.D.   On: 08/17/2020  15:43   . alteplase  2 mg Intracatheter Once  . artificial tears  1 application Both Eyes U0A  . aspirin  81 mg Per Tube Daily  . B-complex with vitamin C  1 tablet Oral Daily  . chlorhexidine gluconate (MEDLINE KIT)  15 mL Mouth Rinse BID  . Chlorhexidine  Gluconate Cloth  6 each Topical Daily  . feeding supplement (PROSource TF)  45 mL Per Tube QID  . folic acid  1 mg Per Tube Daily  . heparin injection (subcutaneous)  5,000 Units Subcutaneous Q8H  . insulin aspart  0-9 Units Subcutaneous Q4H  . mouth rinse  15 mL Mouth Rinse 10 times per day  . metoprolol tartrate  5 mg Intravenous Once  . pantoprazole (PROTONIX) IV  40 mg Intravenous QHS  . sodium chloride flush  10-40 mL Intracatheter Q12H  . thiamine  100 mg Per Tube Daily    BMET    Component Value Date/Time   NA 135 08/19/2020 0428   K 4.4 08/19/2020 0428   CL 96 (L) 08/19/2020 0428   CO2 23 08/19/2020 0428   GLUCOSE 158 (H) 08/19/2020 0428   BUN 86 (H) 08/19/2020 0428   CREATININE 4.13 (H) 08/19/2020 0428   CALCIUM 9.1 08/19/2020 0428   GFRNONAA 17 (L) 08/19/2020 0428   CBC    Component Value Date/Time   WBC 19.3 (H) 08/19/2020 0429   RBC 3.52 (L) 08/19/2020 0429   HGB 11.6 (L) 08/19/2020 0429   HCT 35.3 (L) 08/19/2020 0429   PLT 320 08/19/2020 0429   MCV 100.3 (H) 08/19/2020 0429   MCH 33.0 08/19/2020 0429   MCHC 32.9 08/19/2020 0429   RDW 12.5 08/19/2020 0429   LYMPHSABS 3.7 08/10/2020 1805   MONOABS 1.2 (H) 08/03/2020 1805   EOSABS 0.5 08/23/2020 1805   BASOSABS 0.0 08/03/2020 1805     Assessment/Plan: 1. Oliguric AKI- presumably ischemic ATN in setting of PEA arrest requiring multiple rounds of CPR and pressors as well as contrast induced nephropathy following CT angio.   1. Started on CRRT 08/14/20 2. All fluids 2K (changed from 4 k on 12/25)/2.5Ca:  Pre-filter 500 ml/hr, post-filter 300 ml/hr, Dialysate 1500 ml/hr.  Am going to change back to 4 k baths on 12/26 3. UF goal   backed off to 150-200 per hour since removed 13 liters from 12/22-12/24 and fio2 is better.  Tyler [name] now back off to running even on 12/26  4. Anticoagulation:   added heparin on 12/24 5. Tyler [name] continue with CRRT for several days for volume and clearance.  Then Tyler [name] need to reassess  cognitive function.  If no significant improvement would recommend transition to comfort care given poor overall prognosis for meaningful recovery (due to anoxic brain injury) 6. Left IJ temp catheter placed 08/14/20 by PCCM-  Then replaced due to malfunction on 12/24  2. Acute Hypoxic respiratory failure s/p emergent cric and s/p trach on vent per PCCM. Now with pneumomediastinum. Likely ARDS. still hypoxic  3. Acute metabolic encephalopathy- due to severe hypoxic-ischemic encephalopathy per Neuro.  Tyler [name] plan for trial of CRRT and re-evaluate when uremia has cleared. 4. Aspiration pneumonia with sepsis on abx per PCCM   5. Atrial fibrillation with RVR- currently rate controlled. 6. Anemia of acute illness- not an issue 7. Disposition- poor overall prognosis it seems due to prolonged hypoxia   8. Hyperkalemia-  New issue on 12/24-  Unclear exactly why as had been stable for days, did have signficant acidosis which is  improved-  changed 2 K bath -  Corrected-  Tyler [name] change back to 4 K baths today   Tyler [name]  [employer] [phone]

## 2020-08-19 NOTE — Plan of Care (Signed)
  Problem: Education: Goal: Knowledge of General Education information Tyler [name] improve Description: Including pain rating scale, medication(s)/side effects and non-pharmacologic comfort measures Outcome: Progressing   Problem: Health Behavior/Discharge Planning: Goal: Ability to manage health-related needs Tyler [name] improve Outcome: Progressing   Problem: Clinical Measurements: Goal: Ability to maintain clinical measurements within normal limits Tyler [name] improve Outcome: Progressing Goal: Tyler [name] remain free from infection Outcome: Progressing Goal: Diagnostic test results Tyler [name] improve Outcome: Progressing Goal: Respiratory complications Tyler [name] improve Outcome: Progressing Goal: Cardiovascular complication Tyler [name] be avoided Outcome: Progressing   Problem: Activity: Goal: Risk for activity intolerance Tyler [name] decrease Outcome: Progressing   Problem: Nutrition: Goal: Adequate nutrition Tyler [name] be maintained Outcome: Progressing   Problem: Elimination: Goal: Tyler [name] not experience complications related to bowel motility Outcome: Progressing Goal: Tyler [name] not experience complications related to urinary retention Outcome: Progressing   Problem: Pain Managment: Goal: General experience of comfort Tyler [name] improve Outcome: Progressing   Problem: Safety: Goal: Ability to remain free from injury Tyler [name] improve Outcome: Progressing   Problem: Skin Integrity: Goal: Risk for impaired skin integrity Tyler [name] decrease Outcome: Progressing   Problem: Activity: Goal: Ability to tolerate increased activity Tyler [name] improve Outcome: Progressing   Problem: Respiratory: Goal: Ability to maintain a clear airway and adequate ventilation Tyler [name] improve Outcome: Progressing   Problem: Education: Goal: Ability to manage disease process Tyler [name] improve Outcome: Progressing   Problem: Cardiac: Goal: Ability to achieve and maintain adequate cardiopulmonary perfusion Tyler [name] improve Outcome: Progressing   Problem: Neurologic: Goal:  Promote progressive neurologic recovery Outcome: Progressing   Problem: Skin Integrity: Goal: Risk for impaired skin integrity Tyler [name] be minimized. Outcome: Progressing

## 2020-08-20 ENCOUNTER — Inpatient Hospital Stay (HOSPITAL_COMMUNITY): Payer: Self-pay

## 2020-08-20 LAB — POCT ACTIVATED CLOTTING TIME
Activated Clotting Time: 190 seconds
Activated Clotting Time: 220 seconds
Activated Clotting Time: 220 seconds
Activated Clotting Time: 220 seconds
Activated Clotting Time: 214 seconds
Activated Clotting Time: 208 seconds
Activated Clotting Time: 202 seconds
Activated Clotting Time: 208 seconds
Activated Clotting Time: 220 seconds
Activated Clotting Time: 214 seconds
Activated Clotting Time: 184 seconds
Activated Clotting Time: 184 seconds
Activated Clotting Time: 190 seconds
Activated Clotting Time: 184 seconds
Activated Clotting Time: 190 seconds
Activated Clotting Time: 190 seconds
Activated Clotting Time: 196 seconds
Activated Clotting Time: 190 seconds
Activated Clotting Time: 190 seconds
Activated Clotting Time: 178 seconds
Activated Clotting Time: 172 seconds
Activated Clotting Time: 190 seconds

## 2020-08-20 LAB — GLUCOSE, CAPILLARY
Glucose-Capillary: 133 mg/dL — ABNORMAL HIGH (ref 70–99)
Glucose-Capillary: 128 mg/dL — ABNORMAL HIGH (ref 70–99)
Glucose-Capillary: 131 mg/dL — ABNORMAL HIGH (ref 70–99)
Glucose-Capillary: 105 mg/dL — ABNORMAL HIGH (ref 70–99)
Glucose-Capillary: 114 mg/dL — ABNORMAL HIGH (ref 70–99)
Glucose-Capillary: 112 mg/dL — ABNORMAL HIGH (ref 70–99)
Glucose-Capillary: 162 mg/dL — ABNORMAL HIGH (ref 70–99)

## 2020-08-20 LAB — POCT I-STAT 7, (LYTES, BLD GAS, ICA,H+H)
Calcium, Ion: 1.14 mmol/L — ABNORMAL LOW (ref 1.15–1.40)
HCT: 37 % — ABNORMAL LOW (ref 39.0–52.0)
Hemoglobin: 12.6 g/dL — ABNORMAL LOW (ref 13.0–17.0)
Patient temperature: 38
Potassium: 7 mmol/L (ref 3.5–5.1)
Sodium: 131 mmol/L — ABNORMAL LOW (ref 135–145)
pCO2 arterial: >97 mmHg (ref 32.0–48.0)
pH, Arterial: 6.917 — CL (ref 7.350–7.450)
pO2, Arterial: 121 mmHg — ABNORMAL HIGH (ref 83.0–108.0)

## 2020-08-20 LAB — RENAL FUNCTION PANEL
Albumin: 2.6 g/dL — ABNORMAL LOW (ref 3.5–5.0)
Albumin: 2.8 g/dL — ABNORMAL LOW (ref 3.5–5.0)
Anion gap: 13 (ref 5–15)
Anion gap: 16 — ABNORMAL HIGH (ref 5–15)
BUN: 77 mg/dL — ABNORMAL HIGH (ref 6–20)
BUN: 73 mg/dL — ABNORMAL HIGH (ref 6–20)
CO2: 22 mmol/L (ref 22–32)
CO2: 21 mmol/L — ABNORMAL LOW (ref 22–32)
Calcium: 9 mg/dL (ref 8.9–10.3)
Calcium: 9.1 mg/dL (ref 8.9–10.3)
Chloride: 100 mmol/L (ref 98–111)
Chloride: 99 mmol/L (ref 98–111)
Creatinine, Ser: 3.8 mg/dL — ABNORMAL HIGH (ref 0.61–1.24)
Creatinine, Ser: 3.72 mg/dL — ABNORMAL HIGH (ref 0.61–1.24)
GFR, Estimated: 19 mL/min — ABNORMAL LOW (ref >60)
GFR, Estimated: 19 mL/min — ABNORMAL LOW (ref >60)
Glucose, Bld: 140 mg/dL — ABNORMAL HIGH (ref 70–99)
Glucose, Bld: 140 mg/dL — ABNORMAL HIGH (ref 70–99)
Phosphorus: 6.1 mg/dL — ABNORMAL HIGH (ref 2.5–4.6)
Phosphorus: 5.4 mg/dL — ABNORMAL HIGH (ref 2.5–4.6)
Potassium: 5 mmol/L (ref 3.5–5.1)
Potassium: 5.1 mmol/L (ref 3.5–5.1)
Sodium: 135 mmol/L (ref 135–145)
Sodium: 136 mmol/L (ref 135–145)

## 2020-08-20 LAB — CBC
HCT: 34 % — ABNORMAL LOW (ref 39.0–52.0)
Hemoglobin: 11.6 g/dL — ABNORMAL LOW (ref 13.0–17.0)
MCH: 33.5 pg (ref 26.0–34.0)
MCHC: 34.1 g/dL (ref 30.0–36.0)
MCV: 98.3 fL (ref 80.0–100.0)
Platelets: 366 10*3/uL (ref 150–400)
RBC: 3.46 MIL/uL — ABNORMAL LOW (ref 4.22–5.81)
RDW: 12.5 % (ref 11.5–15.5)
WBC: 17.8 10*3/uL — ABNORMAL HIGH (ref 4.0–10.5)
nRBC: 0 % (ref 0.0–0.2)

## 2020-08-20 LAB — CULTURE, BLOOD (ROUTINE X 2)
Culture: NO GROWTH
Culture: NO GROWTH

## 2020-08-20 LAB — APTT: aPTT: 71 seconds — ABNORMAL HIGH (ref 24–36)

## 2020-08-20 LAB — MAGNESIUM: Magnesium: 3.2 mg/dL — ABNORMAL HIGH (ref 1.7–2.4)

## 2020-08-20 MED ORDER — HYDROMORPHONE HCL 1 MG/ML IJ SOLN
1.0000 mg | INTRAMUSCULAR | Status: DC | PRN
  Administered 2020-08-20 – 2020-08-22 (×5): 1 mg via INTRAVENOUS
  Filled 2020-08-20 (×6): qty 1

## 2020-08-20 MED ORDER — PANTOPRAZOLE SODIUM 40 MG PO PACK
40.0000 mg | PACK | Freq: Every day | ORAL | Status: DC
  Administered 2020-08-20: 40 mg
  Filled 2020-08-20: qty 20

## 2020-08-20 MED ORDER — PRISMASOL BGK 0/2.5 32-2.5 MEQ/L EC SOLN
Status: DC
  Filled 2020-08-20 (×52): qty 5000

## 2020-08-20 MED ORDER — PROSOURCE TF PO LIQD
90.0000 mL | Freq: Three times a day (TID) | ORAL | Status: DC
  Administered 2020-08-20 – 2020-08-25 (×15): 90 mL
  Filled 2020-08-20 (×16): qty 90
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
## 2020-08-20 NOTE — H&P (View-Only) (Signed)
Subjective: Still on vent. Not responsive.  Objective: Vital signs in last 24 hours: Temp:  [97.16 F (36.2 C)-99.32 F (37.4 C)] 98.96 F (37.2 C) (12/27 0700) Pulse Rate:  [67-88] 79 (12/27 0700) Resp:  [16-35] 32 (12/27 0700) BP: (96-136)/(62-86) 107/76 (12/27 0700) SpO2:  [87 %-100 %] 96 % (12/27 0700) Arterial Line BP: (117-145)/(63-81) 123/66 (12/27 0700) FiO2 (%):  [40 %] 40 % (12/27 0415) Weight:  [891 kg] 116 kg (12/27 0421)  Physical Exam: General appearance:On vent,unresponsive Head:Normocephalic, without obvious abnormality, atraumatic Ears: Examination of the ears shows normal auricles and external auditory canals bilaterally. Nose: Nasal examination shows normal mucosa, septum, turbinates.  Face: Facial examination shows no asymmetry.  Mouth:Normal mucosa. No injury. Neck:Trach tube in place. No bleeding. Neuro:Unresponsive.  Recent Labs    08/19/20 0429 08/20/20 0413  WBC 19.3* 17.8*  HGB 11.6* 11.6*  HCT 35.3* 34.0*  PLT 320 366   Recent Labs    08/19/20 1616 08/20/20 0413  NA 134* 135  K 4.4 5.0  CL 97* 100  CO2 22 22  GLUCOSE 142* 140*  BUN 78* 77*  CREATININE 3.87* 3.80*  CALCIUM 9.2 9.0    Medications:  I have reviewed the patient's current medications. Scheduled: . alteplase  2 mg Intracatheter Once  . artificial tears  1 application Both Eyes Q9I  . aspirin  81 mg Per Tube Daily  . B-complex with vitamin C  1 tablet Oral Daily  . chlorhexidine gluconate (MEDLINE KIT)  15 mL Mouth Rinse BID  . Chlorhexidine Gluconate Cloth  6 each Topical Daily  . feeding supplement (PROSource TF)  45 mL Per Tube QID  . folic acid  1 mg Per Tube Daily  . heparin injection (subcutaneous)  5,000 Units Subcutaneous Q8H  . insulin aspart  0-9 Units Subcutaneous Q4H  . mouth rinse  15 mL Mouth Rinse 10 times per day  . metoprolol tartrate  5 mg Intravenous Once  . pantoprazole (PROTONIX) IV  40 mg Intravenous QHS  . sodium chloride flush  10-40  mL Intracatheter Q12H  . thiamine  100 mg Per Tube Daily   Continuous: .  prismasol BGK 4/2.5 400 mL/hr at 08/19/20 2204  .  prismasol BGK 4/2.5 400 mL/hr at 08/19/20 2206  . feeding supplement (VITAL 1.5 CAL) 1,000 mL (08/17/20 1410)  . fentaNYL infusion INTRAVENOUS 200 mcg/hr (08/20/20 0700)  . heparin 10,000 units/ 20 mL infusion syringe 2,100 Units/hr (08/20/20 0800)  . midazolam 2 mg/hr (08/20/20 0700)  . norepinephrine (LEVOPHED) Adult infusion Stopped (08/18/20 0046)  . phenylephrine (NEO-SYNEPHRINE) Adult infusion Stopped (08/17/20 2237)  . piperacillin-tazobactam Stopped (08/20/20 0538)  . prismasol BGK 2/2.5 dialysis solution      Assessment/Plan: POD #9s/p trach. On vent - Trach changed to new #8 cuffed Shiley trach tube. - Routine trach care. - Wean vent as tolerated. - Fleet sign off. Please call with questions or concerns.   LOS: 11 days   Tyler [name] W Tyler [name] 08/20/2020, 8:15 AM

## 2020-08-20 NOTE — Plan of Care (Signed)
  Problem: Education: Goal: Knowledge of General Education information Piers improve Description: Including pain rating scale, medication(s)/side effects and non-pharmacologic comfort measures Outcome: Progressing   Problem: Health Behavior/Discharge Planning: Goal: Ability to manage health-related needs Beryle improve Outcome: Progressing   Problem: Clinical Measurements: Goal: Ability to maintain clinical measurements within normal limits Keshun improve Outcome: Progressing Goal: Mercury remain free from infection Outcome: Progressing Goal: Diagnostic test results Tanner improve Outcome: Progressing Goal: Respiratory complications Challen improve Outcome: Progressing Goal: Cardiovascular complication Trevis be avoided Outcome: Progressing   Problem: Activity: Goal: Risk for activity intolerance Daquane decrease Outcome: Progressing   Problem: Nutrition: Goal: Adequate nutrition [name] be maintained Outcome: Progressing   Problem: Elimination: Goal: Navon not experience complications related to bowel motility Outcome: Progressing Goal: Deveon not experience complications related to urinary retention Outcome: Progressing   Problem: Pain Managment: Goal: General experience of comfort Cleston improve Outcome: Progressing   Problem: Safety: Goal: Ability to remain free from injury Crystian improve Outcome: Progressing   Problem: Skin Integrity: Goal: Risk for impaired skin integrity Christiaan decrease Outcome: Progressing   Problem: Activity: Goal: Ability to tolerate increased activity Maicol improve Outcome: Progressing   Problem: Respiratory: Goal: Ability to maintain a clear airway and adequate ventilation Vitor improve Outcome: Progressing   Problem: Education: Goal: Ability to manage disease process Sanav improve Outcome: Progressing   Problem: Cardiac: Goal: Ability to achieve and maintain adequate cardiopulmonary perfusion Margarito improve Outcome: Progressing   Problem: Neurologic: Goal:  Promote progressive neurologic recovery Outcome: Progressing   Problem: Skin Integrity: Goal: Risk for impaired skin integrity Tiger be minimized. Outcome: Progressing   

## 2020-08-20 NOTE — Progress Notes (Signed)
Subjective: Still on vent. Not responsive.  Objective: Vital signs in last 24 hours: Temp:  [97.16 F (36.2 C)-99.32 F (37.4 C)] 98.96 F (37.2 C) (12/27 0700) Pulse Rate:  [67-88] 79 (12/27 0700) Resp:  [16-35] 32 (12/27 0700) BP: (96-136)/(62-86) 107/76 (12/27 0700) SpO2:  [87 %-100 %] 96 % (12/27 0700) Arterial Line BP: (117-145)/(63-81) 123/66 (12/27 0700) FiO2 (%):  [40 %] 40 % (12/27 0415) Weight:  [891 kg] 116 kg (12/27 0421)  Physical Exam: General appearance:On vent,unresponsive Head:Normocephalic, without obvious abnormality, atraumatic Ears: Examination of the ears shows normal auricles and external auditory canals bilaterally. Nose: Nasal examination shows normal mucosa, septum, turbinates.  Face: Facial examination shows no asymmetry.  Mouth:Normal mucosa. No injury. Neck:Trach tube in place. No bleeding. Neuro:Unresponsive.  Recent Labs    08/19/20 0429 08/20/20 0413  WBC 19.3* 17.8*  HGB 11.6* 11.6*  HCT 35.3* 34.0*  PLT 320 366   Recent Labs    08/19/20 1616 08/20/20 0413  NA 134* 135  K 4.4 5.0  CL 97* 100  CO2 22 22  GLUCOSE 142* 140*  BUN 78* 77*  CREATININE 3.87* 3.80*  CALCIUM 9.2 9.0    Medications:  I have reviewed the patient's current medications. Scheduled: . alteplase  2 mg Intracatheter Once  . artificial tears  1 application Both Eyes Q9I  . aspirin  81 mg Per Tube Daily  . B-complex with vitamin C  1 tablet Oral Daily  . chlorhexidine gluconate (MEDLINE KIT)  15 mL Mouth Rinse BID  . Chlorhexidine Gluconate Cloth  6 each Topical Daily  . feeding supplement (PROSource TF)  45 mL Per Tube QID  . folic acid  1 mg Per Tube Daily  . heparin injection (subcutaneous)  5,000 Units Subcutaneous Q8H  . insulin aspart  0-9 Units Subcutaneous Q4H  . mouth rinse  15 mL Mouth Rinse 10 times per day  . metoprolol tartrate  5 mg Intravenous Once  . pantoprazole (PROTONIX) IV  40 mg Intravenous QHS  . sodium chloride flush  10-40  mL Intracatheter Q12H  . thiamine  100 mg Per Tube Daily   Continuous: .  prismasol BGK 4/2.5 400 mL/hr at 08/19/20 2204  .  prismasol BGK 4/2.5 400 mL/hr at 08/19/20 2206  . feeding supplement (VITAL 1.5 CAL) 1,000 mL (08/17/20 1410)  . fentaNYL infusion INTRAVENOUS 200 mcg/hr (08/20/20 0700)  . heparin 10,000 units/ 20 mL infusion syringe 2,100 Units/hr (08/20/20 0800)  . midazolam 2 mg/hr (08/20/20 0700)  . norepinephrine (LEVOPHED) Adult infusion Stopped (08/18/20 0046)  . phenylephrine (NEO-SYNEPHRINE) Adult infusion Stopped (08/17/20 2237)  . piperacillin-tazobactam Stopped (08/20/20 0538)  . prismasol BGK 2/2.5 dialysis solution      Assessment/Plan: POD #9s/p trach. On vent - Trach changed to new #8 cuffed Shiley trach tube. - Routine trach care. - Wean vent as tolerated. - Fleet sign off. Please call with questions or concerns.   LOS: 11 days   Muranda Coye W Ames Hoban 08/20/2020, 8:15 AM

## 2020-08-20 NOTE — Progress Notes (Signed)
Assisted tele visit to patient with family member.  Dalasia Predmore McEachran, RN  

## 2020-08-20 NOTE — Progress Notes (Signed)
[facility] NEPHROLOGY PROGRESS NOTE  Assessment/ Plan:  # Oligo-anuric AKI- presumably ischemic ATN in setting of PEA arrest requiring multiple rounds of CPR and pressors as well as contrast induced nephropathy following CT angio. Started on CRRT 08/14/20. Left IJ temp catheter placed 08/14/20 by PCCM-  Then replaced due to malfunction on 12/24. Currently on all 4K bath with potassium 5.  I Japheth change to dialysate 2K bath.  On heparin for anticoagulation.  Volume status acceptable, UF even to 50 cc an hour.  No meaningful mental recovery with CRRT. Suspected anoxic brain injury and poor prognosis.  Recommend transition to comfort care.  # Acute Hypoxic respiratory failure s/p emergent cric and s/p trach on vent per PCCM. Now with pneumomediastinum. Likely ARDS. still hypoxic  # Acute metabolic encephalopathy- due to severe hypoxic-ischemic encephalopathy per Neuro. no meaningful improvement even after trial of CRRT.  Poor prognosis.  Noted plan for meeting with family today.  Recommend palliative care consult.  # Aspiration pneumonia with sepsis on abx per PCCM.  # Atrial fibrillation with RVR- currently rate controlled.  # Anemia of acute illness- not an issue.  Subjective: Tolerating CRRT well.  No new event.  Not opening eyes or following commands.  BP acceptable. Objective Vital signs in last 24 hours: Vitals:   08/20/20 0421 08/20/20 0500 08/20/20 0600 08/20/20 0700  BP:  114/79 113/77 107/76  Pulse:  87 83 79  Resp:  (!) 32 (!) 26 (!) 32  Temp:  99.14 F (37.3 C) 99.14 F (37.3 C) 98.96 F (37.2 C)  TempSrc:  Core Core Core  SpO2:  97% 94% 96%  Weight: 116 kg     Height:       Weight change: -0.9 kg  Intake/Output Summary (Last 24 hours) at 08/20/2020 0808 Last data filed at 08/20/2020 0700 Gross per 24 hour  Intake 1992.19 ml  Output 2586 ml  Net -593.81 ml       Labs: Basic Metabolic Panel: Recent Labs  Lab 08/19/20 0428  08/19/20 1616 08/20/20 0413  NA 135 134* 135  K 4.4 4.4 5.0  CL 96* 97* 100  CO2 _0 GLUCOSE 158* 142* 140*  BUN 86* 78* 77*  CREATININE 4.13* 3.87* 3.80*  CALCIUM 9.1 9.2 9.0  PHOS 7.0* 6.8* 6.1*   Liver Function Tests: Recent Labs  Lab 08/16/20 0427 08/16/20 1548 08/19/20 0428 08/19/20 1616 08/20/20 0413  AST 45*  --   --   --   --   ALT 71*  --   --   --   --   ALKPHOS 144*  --   --   --   --   BILITOT 0.7  --   --   --   --   PROT 7.5  --   --   --   --   ALBUMIN 2.4*  2.3*   < > 2.7* 2.8* 2.6*   < > = values in this interval not displayed.   No results for input(s): LIPASE, AMYLASE in the last 168 hours. Recent Labs  Lab 08/15/20 1031  AMMONIA 48*   CBC: Recent Labs  Lab 08/16/20 0427 08/16/20 0641 08/17/20 0426 08/17/20 1031 08/18/20 0355 08/18/20 0415 08/19/20 0429 08/20/20 0413  WBC 13.4*  --  11.0*  --  19.3*  --  19.3* 17.8*  HGB 11.9*   < > 11.2*   < > 11.0* 10.9* 11.6* 11.6*  HCT 37.1*   < > 33.0*   < >  33.8* 32.0* 35.3* 34.0*  MCV 102.8*  --  98.2  --  102.7*  --  100.3* 98.3  PLT 151  --  202  --  231  --  320 366   < > = values in this interval not displayed.   Cardiac Enzymes: No results for input(s): CKTOTAL, CKMB, CKMBINDEX, TROPONINI in the last 168 hours. CBG: Recent Labs  Lab 08/19/20 1205 08/19/20 1610 08/19/20 2005 08/19/20 2304 08/20/20 0406  GLUCAP 160* 135* 151* 133* 128*    Iron Studies: No results for input(s): IRON, TIBC, TRANSFERRIN, FERRITIN in the last 72 hours. Studies/Results: DG Chest Port 1 View  Result Date: 08/20/2020 CLINICAL DATA:  Acute respiratory failure. EXAM: PORTABLE CHEST 1 VIEW COMPARISON:  August 19, 2020. FINDINGS: Similar positioning of a tracheostomy tube which projects midline over the trachea. Enteric tube courses below the diaphragm outside the field of view. Similar position of a right IJ central venous catheter with the tip projecting at the SVC. Similar low lung volumes with mild  enlargement the cardiac silhouette and pulmonary vascular congestion. Similar streaky bibasilar opacities. IMPRESSION: 1. Similar low lung volumes, mild cardiomegaly, and pulmonary vascular congestion without overt pulmonary edema. 2. Similar streaky bibasilar opacities, favor atelectasis. Electronically Signed   By: Margaretha Sheffield MD   On: 08/20/2020 07:44   DG Chest Port 1 View  Result Date: 08/19/2020 CLINICAL DATA:  Acute respiratory failure. EXAM: PORTABLE CHEST 1 VIEW COMPARISON:  08/18/2020 FINDINGS: 0549 hours. Tracheostomy tube again noted. A feeding tube passes into the stomach although the distal tip position is not included on the film. Right IJ central line tip projects over the proximal SVC level. Low volume film with enlargement of the cardiopericardial silhouette and vascular congestion. Associated component of interstitial edema not excluded. Streaky opacity in the bases suggest atelectasis. No substantial pleural effusion. IMPRESSION: 1. Low volume film with enlargement of the cardiopericardial silhouette and vascular congestion. 2. Bibasilar atelectasis. Electronically Signed   By: Misty Stanley M.D.   On: 08/19/2020 06:02    Medications: Infusions: .  prismasol BGK 4/2.5 400 mL/hr at 08/19/20 2204  .  prismasol BGK 4/2.5 400 mL/hr at 08/19/20 2206  . feeding supplement (VITAL 1.5 CAL) 1,000 mL (08/17/20 1410)  . fentaNYL infusion INTRAVENOUS 200 mcg/hr (08/20/20 0700)  . heparin 10,000 units/ 20 mL infusion syringe 2,050 Units/hr (08/20/20 0518)  . midazolam 2 mg/hr (08/20/20 0700)  . norepinephrine (LEVOPHED) Adult infusion Stopped (08/18/20 0046)  . phenylephrine (NEO-SYNEPHRINE) Adult infusion Stopped (08/17/20 2237)  . piperacillin-tazobactam Stopped (08/20/20 0538)  . prismasol BGK 4/2.5 1,500 mL/hr at 08/20/20 0300    Scheduled Medications: . alteplase  2 mg Intracatheter Once  . artificial tears  1 application Both Eyes Z7H  . aspirin  81 mg Per Tube Daily  .  B-complex with vitamin C  1 tablet Oral Daily  . chlorhexidine gluconate (MEDLINE KIT)  15 mL Mouth Rinse BID  . Chlorhexidine Gluconate Cloth  6 each Topical Daily  . feeding supplement (PROSource TF)  45 mL Per Tube QID  . folic acid  1 mg Per Tube Daily  . heparin injection (subcutaneous)  5,000 Units Subcutaneous Q8H  . insulin aspart  0-9 Units Subcutaneous Q4H  . mouth rinse  15 mL Mouth Rinse 10 times per day  . metoprolol tartrate  5 mg Intravenous Once  . pantoprazole (PROTONIX) IV  40 mg Intravenous QHS  . sodium chloride flush  10-40 mL Intracatheter Q12H  . thiamine  100 mg Per Tube Daily    have reviewed scheduled and prn medications.  Physical Exam: General: On vent via trach, sedated Heart:RRR, s1s2 nl Lungs: Coarse breath sound bilateral Abdomen:soft, Non-tender, non-distended Extremities: Trace edema Dialysis Access: IJ cath placed on 12/21.  Edrei Norgaard Prasad Nikolus Marczak 08/20/2020,8:08 AM  LOS: 11 days

## 2020-08-20 NOTE — Progress Notes (Signed)
Nutrition Follow Up  DOCUMENTATION CODES:   Obesity unspecified  INTERVENTION:   Tube feeding:  -Vital 1.5 @ 50 ml/hr via Cortrak -ProSource TF 90 ml TID  Provides: 2040 kcals, 147 grams protein, 917 ml free water.   Continue B complex with Vitamin C to account for losses with CRRT  NUTRITION DIAGNOSIS:   Inadequate oral intake related to dysphagia,acute illness as evidenced by NPO status,per patient/family report.  Ongoing  GOAL:   Patient Izik meet greater than or equal to 90% of their needs   Addressed via TF  MONITOR:   Vent status,TF tolerance,Weight trends,Labs  REASON FOR ASSESSMENT:   Ventilator    ASSESSMENT:   49 yo male admitted post witness out of hospital PEA arrest with difficulty airway requiring emergent cricothyroidotomy, AKI .PMH includes esophageal stricture with food impaction, GERD, PTSD   12/18- trach in OR by ENT 12/21- CRRT started 12/23- worsening ARDS, patient proned   Pt discussed during ICU rounds and with RN.   Off sedation. Remains unresponsive. Suspect hypoxic injury, may require MRI. Being kept even on CRRT. Tolerating tube feeding at goal rate. Increase ProSource to maximize protein.   GOC discussions are ongoing.   Admission weight: 125 kg  Current weight: 116 kg   Patient requiring ventilator support via trach MV: 23.4 L/min Temp (24hrs), Avg:98.6 F (37 C), Min:97.16 F (36.2 C), Max:99.32 F (37.4 C)   UOP: 75 ml x 24 hrs CRRT: 2389 ml x 24 hrs  Stool: 500 ml x 24 hrs   Medications: folic acid, SS novolog, thiamine Labs: Phosphorus 6.1 (H) Mg 3.2 (H) CBG 105-128  Diet Order:   Diet Order    None      EDUCATION NEEDS:   Not appropriate for education at this time  Skin:  Skin Assessment: Skin Integrity Issues: Skin Integrity Issues:: Incisions Incisions: neck  Last BM:  12/27 via rectal tube  Height:   Ht Readings from Last 1 Encounters:  08/18/20 5\' 8"  (1.727 m)    Weight:   Wt Readings  from Last 1 Encounters:  08/20/20 116 kg    BMI:  Body mass index is 38.88 kg/m.  Estimated Nutritional Needs:   Kcal:  1800-2000 kcal  Protein:  140-175 grams  Fluid:  >/= 2 L  08/22/20 RD, LDN Clinical Nutrition Pager listed in AMION

## 2020-08-21 ENCOUNTER — Inpatient Hospital Stay (HOSPITAL_COMMUNITY): Payer: Self-pay

## 2020-08-21 LAB — RENAL FUNCTION PANEL
Albumin: 2.8 g/dL — ABNORMAL LOW (ref 3.5–5.0)
Albumin: 3.1 g/dL — ABNORMAL LOW (ref 3.5–5.0)
Anion gap: 14 (ref 5–15)
Anion gap: 14 (ref 5–15)
BUN: 70 mg/dL — ABNORMAL HIGH (ref 6–20)
BUN: 70 mg/dL — ABNORMAL HIGH (ref 6–20)
CO2: 22 mmol/L (ref 22–32)
CO2: 23 mmol/L (ref 22–32)
Calcium: 9.2 mg/dL (ref 8.9–10.3)
Calcium: 9.3 mg/dL (ref 8.9–10.3)
Chloride: 99 mmol/L (ref 98–111)
Chloride: 98 mmol/L (ref 98–111)
Creatinine, Ser: 3.71 mg/dL — ABNORMAL HIGH (ref 0.61–1.24)
Creatinine, Ser: 3.77 mg/dL — ABNORMAL HIGH (ref 0.61–1.24)
GFR, Estimated: 19 mL/min — ABNORMAL LOW
GFR, Estimated: 19 mL/min — ABNORMAL LOW (ref >60)
Glucose, Bld: 160 mg/dL — ABNORMAL HIGH (ref 70–99)
Glucose, Bld: 148 mg/dL — ABNORMAL HIGH (ref 70–99)
Phosphorus: 6.5 mg/dL — ABNORMAL HIGH (ref 2.5–4.6)
Phosphorus: 7.2 mg/dL — ABNORMAL HIGH (ref 2.5–4.6)
Potassium: 4.6 mmol/L (ref 3.5–5.1)
Potassium: 5.1 mmol/L (ref 3.5–5.1)
Sodium: 135 mmol/L (ref 135–145)
Sodium: 135 mmol/L (ref 135–145)

## 2020-08-21 LAB — POCT ACTIVATED CLOTTING TIME
Activated Clotting Time: 237 s
Activated Clotting Time: 237 seconds
Activated Clotting Time: 196 seconds
Activated Clotting Time: 225 s
Activated Clotting Time: 219 s
Activated Clotting Time: 220 seconds
Activated Clotting Time: 225 s

## 2020-08-21 LAB — GLUCOSE, CAPILLARY
Glucose-Capillary: 141 mg/dL — ABNORMAL HIGH (ref 70–99)
Glucose-Capillary: 168 mg/dL — ABNORMAL HIGH (ref 70–99)
Glucose-Capillary: 184 mg/dL — ABNORMAL HIGH (ref 70–99)
Glucose-Capillary: 164 mg/dL — ABNORMAL HIGH (ref 70–99)
Glucose-Capillary: 142 mg/dL — ABNORMAL HIGH (ref 70–99)
Glucose-Capillary: 161 mg/dL — ABNORMAL HIGH (ref 70–99)

## 2020-08-21 LAB — APTT: aPTT: 90 seconds — ABNORMAL HIGH (ref 24–36)

## 2020-08-21 LAB — MAGNESIUM: Magnesium: 3.1 mg/dL — ABNORMAL HIGH (ref 1.7–2.4)

## 2020-08-21 MED ORDER — ALTEPLASE 2 MG IJ SOLR
2.0000 mg | Freq: Once | INTRAMUSCULAR | Status: AC
  Administered 2020-08-21: 2 mg
  Filled 2020-08-21: qty 2

## 2020-08-21 MED ORDER — PANTOPRAZOLE SODIUM 40 MG IV SOLR
40.0000 mg | Freq: Every day | INTRAVENOUS | Status: DC
  Administered 2020-08-21 – 2020-08-22 (×2): 40 mg via INTRAVENOUS
  Filled 2020-08-21 (×3): qty 40

## 2020-08-21 MED ORDER — CHLORHEXIDINE GLUCONATE 0.12 % MT SOLN
OROMUCOSAL | Status: AC
  Filled 2020-08-21: qty 15

## 2020-08-21 MED ORDER — NUTRISOURCE FIBER PO PACK
1.0000 | PACK | Freq: Two times a day (BID) | ORAL | Status: DC
  Administered 2020-08-21 – 2020-08-25 (×9): 1
  Filled 2020-08-21 (×13): qty 1

## 2020-08-21 MED ORDER — CHLORHEXIDINE GLUCONATE CLOTH 2 % EX PADS
6.0000 | MEDICATED_PAD | Freq: Every day | CUTANEOUS | Status: DC
  Administered 2020-08-21 – 2020-08-25 (×6): 6 via TOPICAL

## 2020-08-21 NOTE — Progress Notes (Signed)
Pharmacy Antibiotic Note  Tyler [name] is a 49 y.o. male admitted on 08-28-20 with asp pna.  Pharmacy has been consulted for Zosyn dosing. Pt on CRRT. Seven day course ordered.  Plan: Zosyn 3.375gm IV q6h through 12/30   Height: 5\' 8"  (172.7 cm) Weight: 112.7 kg (248 lb 7.3 oz) IBW/kg (Calculated) : 68.4  Temp (24hrs), Avg:98.8 F (37.1 C), Min:97.7 F (36.5 C), Max:99.32 F (37.4 C)  Recent Labs  Lab 08/16/20 0427 08/16/20 1548 08/17/20 0426 08/17/20 1724 08/18/20 0355 08/18/20 1605 08/19/20 0428 08/19/20 0429 08/19/20 1616 08/20/20 0413 08/20/20 1608 08/21/20 0346  WBC 13.4*  --  11.0*  --  19.3*  --   --  19.3*  --  17.8*  --   --   CREATININE 6.65*   < > 5.25*   < > 5.49*   < > 4.13*  --  3.87* 3.80* 3.72* 3.71*   < > = values in this interval not displayed.    Estimated Creatinine Clearance: 29.3 mL/min (A) (by C-G formula based on SCr of 3.71 mg/dL (H)).    No Known Allergies  Antimicrobials this admission: Zosyn 12/24>>(12/30) Unasyn 12/17>> 12/21 Zosyn/vanc 12/16 x1  Microbiology results: Bcx 12/16: ngtd MRSA PCR 12/17: neg Ucx 12/17: no growth BCx 12/22: ngtd Trach asp 12/22: ngtd   Thank you for allowing pharmacy to be a part of this patient's care.  1/23, PharmD, BCPS, [facility] Clinical Pharmacist [phone] Please check AMION for all [facility] Pharmacy numbers 08/21/2020

## 2020-08-21 NOTE — Progress Notes (Signed)
[name] NEPHROLOGY PROGRESS NOTE  Assessment/ Plan:  # Oligo-anuric AKI- presumably ischemic ATN in setting of PEA arrest requiring multiple rounds of CPR and pressors as well as contrast induced nephropathy following CT angio. Started on CRRT 08/14/20. Left IJ temp catheter placed 08/14/20 by PCCM-  Then replaced due to malfunction on 12/24. Potassium level acceptable.  Continue current CRRT prescription. On heparin for anticoagulation.  Volume status acceptable, UF even to 50 cc an hour.  No meaningful mental recovery with CRRT. Suspected anoxic brain injury and poor prognosis.  Recommend transition to comfort care and palliative care consult.  # Acute Hypoxic respiratory failure s/p emergent cric and s/p trach on vent per PCCM. Likely ARDS. still hypoxic  # Acute metabolic encephalopathy- due to severe hypoxic-ischemic encephalopathy per Neuro. no meaningful improvement even after trial of CRRT.  Poor prognosis.  Recommend comfort/hospice care.  # Aspiration pneumonia with sepsis on abx per PCCM.  # Atrial fibrillation with RVR- currently rate controlled.  # Anemia of acute illness- not an issue.  Subjective: Tolerating CRRT well.  UF even mostly.  No meaningful improvement, not waking up.  No other new event.  Discussed with ICU nurse.  Objective Vital signs in last 24 hours: Vitals:   08/21/20 0500 08/21/20 0600 08/21/20 0700 08/21/20 0800  BP:      Pulse: 92  90 87  Resp: (!) 25 (!) 23 (!) 23 (!) 29  Temp: 98.06 F (36.7 C) 97.7 F (36.5 C) 97.7 F (36.5 C) 97.88 F (36.6 C)  TempSrc:    Core  SpO2: 100%  95% 94%  Weight:      Height:       Weight change: -3.3 kg  Intake/Output Summary (Last 24 hours) at 08/21/2020 0837 Last data filed at 08/21/2020 0800 Gross per 24 hour  Intake 1541.04 ml  Output 2268 ml  Net -726.96 ml       Labs: Basic Metabolic Panel: Recent Labs  Lab 08/20/20 0413 08/20/20 1608 08/21/20 0346  NA 135 136 135   K 5.0 5.1 4.6  CL 100 99 99  CO2 22 21* 22  GLUCOSE 140* 140* 160*  BUN 77* 73* 70*  CREATININE 3.80* 3.72* 3.71*  CALCIUM 9.0 9.1 9.2  PHOS 6.1* 5.4* 6.5*   Liver Function Tests: Recent Labs  Lab 08/16/20 0427 08/16/20 1548 08/20/20 0413 08/20/20 1608 08/21/20 0346  AST 45*  --   --   --   --   ALT 71*  --   --   --   --   ALKPHOS 144*  --   --   --   --   BILITOT 0.7  --   --   --   --   PROT 7.5  --   --   --   --   ALBUMIN 2.4*  2.3*   < > 2.6* 2.8* 2.8*   < > = values in this interval not displayed.   No results for input(s): LIPASE, AMYLASE in the last 168 hours. Recent Labs  Lab 08/15/20 1031  AMMONIA 48*   CBC: Recent Labs  Lab 08/16/20 0427 08/16/20 0641 08/17/20 0426 08/17/20 1031 08/18/20 0355 08/18/20 0415 08/19/20 0429 08/20/20 0413  WBC 13.4*  --  11.0*  --  19.3*  --  19.3* 17.8*  HGB 11.9*   < > 11.2*   < > 11.0* 10.9* 11.6* 11.6*  HCT 37.1*   < > 33.0*   < > 33.8* 32.0* 35.3*  34.0*  MCV 102.8*  --  98.2  --  102.7*  --  100.3* 98.3  PLT 151  --  202  --  231  --  320 366   < > = values in this interval not displayed.   Cardiac Enzymes: No results for input(s): CKTOTAL, CKMB, CKMBINDEX, TROPONINI in the last 168 hours. CBG: Recent Labs  Lab 08/20/20 1234 08/20/20 1527 08/20/20 2015 08/20/20 2343 08/21/20 0338  GLUCAP 114* 112* 162* 141* 168*    Iron Studies: No results for input(s): IRON, TIBC, TRANSFERRIN, FERRITIN in the last 72 hours. Studies/Results: DG Chest Port 1 View  Result Date: 08/20/2020 CLINICAL DATA:  Acute respiratory failure. EXAM: PORTABLE CHEST 1 VIEW COMPARISON:  August 19, 2020. FINDINGS: Similar positioning of a tracheostomy tube which projects midline over the trachea. Enteric tube courses below the diaphragm outside the field of view. Similar position of a right IJ central venous catheter with the tip projecting at the SVC. Similar low lung volumes with mild enlargement the cardiac silhouette and pulmonary  vascular congestion. Similar streaky bibasilar opacities. IMPRESSION: 1. Similar low lung volumes, mild cardiomegaly, and pulmonary vascular congestion without overt pulmonary edema. 2. Similar streaky bibasilar opacities, favor atelectasis. Electronically Signed   By: Margaretha Sheffield MD   On: 08/20/2020 07:44    Medications: Infusions: .  prismasol BGK 4/2.5 500 mL/hr at 08/21/20 5597  .  prismasol BGK 4/2.5 300 mL/hr at 08/21/20 0355  . feeding supplement (VITAL 1.5 CAL) 1,000 mL (08/17/20 1410)  . heparin 10,000 units/ 20 mL infusion syringe 2,050 Units/hr (08/21/20 0604)  . norepinephrine (LEVOPHED) Adult infusion Stopped (08/18/20 0046)  . phenylephrine (NEO-SYNEPHRINE) Adult infusion Stopped (08/17/20 2237)  . piperacillin-tazobactam Stopped (08/21/20 0654)  . prismasol BGK 2/2.5 dialysis solution 1,500 mL/hr at 08/21/20 4163    Scheduled Medications: . alteplase  2 mg Intracatheter Once  . aspirin  81 mg Per Tube Daily  . B-complex with vitamin C  1 tablet Oral Daily  . chlorhexidine gluconate (MEDLINE KIT)  15 mL Mouth Rinse BID  . Chlorhexidine Gluconate Cloth  6 each Topical Daily  . feeding supplement (PROSource TF)  90 mL Per Tube TID  . folic acid  1 mg Per Tube Daily  . heparin injection (subcutaneous)  5,000 Units Subcutaneous Q8H  . insulin aspart  0-9 Units Subcutaneous Q4H  . mouth rinse  15 mL Mouth Rinse 10 times per day  . pantoprazole sodium  40 mg Per Tube QHS  . sodium chloride flush  10-40 mL Intracatheter Q12H  . thiamine  100 mg Per Tube Daily    have reviewed scheduled and prn medications.  Physical Exam: General: On vent via trach, sedated, nonresponsive Heart:RRR, s1s2 nl Lungs: Coarse breath sound bilateral Abdomen:soft, Non-tender, non-distended Extremities: Trace edema Dialysis Access: IJ cath placed on 12/21.  Tyler [name] Tyler [name] 08/21/2020,8:37 AM  LOS: 12 days
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
## 2020-08-21 NOTE — Plan of Care (Signed)
  Problem: Education: Goal: Knowledge of General Education information Tyler [name] improve Description: Including pain rating scale, medication(s)/side effects and non-pharmacologic comfort measures Outcome: Progressing   Problem: Health Behavior/Discharge Planning: Goal: Ability to manage health-related needs Tyler [name] improve Outcome: Progressing   Problem: Clinical Measurements: Goal: Ability to maintain clinical measurements within normal limits Tyler [name] improve Outcome: Progressing Goal: Tyler [name] remain free from infection Outcome: Progressing Goal: Diagnostic test results Tyler [name] improve Outcome: Progressing Goal: Respiratory complications Tyler [name] improve Outcome: Progressing Goal: Cardiovascular complication Tyler [name] be avoided Outcome: Progressing   Problem: Activity: Goal: Risk for activity intolerance Tyler [name] decrease Outcome: Progressing   Problem: Nutrition: Goal: Adequate nutrition Tyler [name] be maintained Outcome: Progressing   Problem: Elimination: Goal: Tyler [name] not experience complications related to bowel motility Outcome: Progressing Goal: Tyler [name] not experience complications related to urinary retention Outcome: Progressing   Problem: Pain Managment: Goal: General experience of comfort Tyler [name] improve Outcome: Progressing   Problem: Safety: Goal: Ability to remain free from injury Tyler [name] improve Outcome: Progressing   Problem: Skin Integrity: Goal: Risk for impaired skin integrity Tyler [name] decrease Outcome: Progressing   Problem: Activity: Goal: Ability to tolerate increased activity Tyler [name] improve Outcome: Progressing   Problem: Respiratory: Goal: Ability to maintain a clear airway and adequate ventilation Tyler [name] improve Outcome: Progressing   Problem: Education: Goal: Ability to manage disease process Tyler [name] improve Outcome: Progressing   Problem: Cardiac: Goal: Ability to achieve and maintain adequate cardiopulmonary perfusion Tyler [name] improve Outcome: Progressing   Problem: Neurologic: Goal:  Promote progressive neurologic recovery Outcome: Progressing   Problem: Skin Integrity: Goal: Risk for impaired skin integrity Tyler [name] be minimized. Outcome: Progressing   

## 2020-08-22 DIAGNOSIS — Z7189 Other specified counseling: Secondary | ICD-10-CM

## 2020-08-22 DIAGNOSIS — Z515 Encounter for palliative care: Secondary | ICD-10-CM

## 2020-08-22 DIAGNOSIS — R2990 Unspecified symptoms and signs involving the nervous system: Secondary | ICD-10-CM

## 2020-08-22 LAB — GLUCOSE, CAPILLARY
Glucose-Capillary: 138 mg/dL — ABNORMAL HIGH (ref 70–99)
Glucose-Capillary: 164 mg/dL — ABNORMAL HIGH (ref 70–99)
Glucose-Capillary: 129 mg/dL — ABNORMAL HIGH (ref 70–99)
Glucose-Capillary: 152 mg/dL — ABNORMAL HIGH (ref 70–99)

## 2020-08-22 LAB — POCT ACTIVATED CLOTTING TIME
Activated Clotting Time: 196 seconds
Activated Clotting Time: 202 seconds
Activated Clotting Time: 196 seconds
Activated Clotting Time: 202 seconds
Activated Clotting Time: 190 seconds
Activated Clotting Time: 244 seconds
Activated Clotting Time: 225 seconds
Activated Clotting Time: 231 seconds
Activated Clotting Time: 231 seconds
Activated Clotting Time: 237 seconds
Activated Clotting Time: 237 seconds
Activated Clotting Time: 232 seconds
Activated Clotting Time: 237 seconds
Activated Clotting Time: 231 seconds
Activated Clotting Time: 231 seconds
Activated Clotting Time: 214 seconds
Activated Clotting Time: 202 seconds

## 2020-08-22 LAB — RENAL FUNCTION PANEL
Albumin: 3.2 g/dL — ABNORMAL LOW (ref 3.5–5.0)
Anion gap: 16 — ABNORMAL HIGH (ref 5–15)
BUN: 70 mg/dL — ABNORMAL HIGH (ref 6–20)
CO2: 23 mmol/L (ref 22–32)
Calcium: 9.5 mg/dL (ref 8.9–10.3)
Chloride: 94 mmol/L — ABNORMAL LOW (ref 98–111)
Creatinine, Ser: 3.9 mg/dL — ABNORMAL HIGH (ref 0.61–1.24)
GFR, Estimated: 18 mL/min — ABNORMAL LOW (ref >60)
Glucose, Bld: 159 mg/dL — ABNORMAL HIGH (ref 70–99)
Phosphorus: 7.6 mg/dL — ABNORMAL HIGH (ref 2.5–4.6)
Potassium: 5.3 mmol/L — ABNORMAL HIGH (ref 3.5–5.1)
Sodium: 133 mmol/L — ABNORMAL LOW (ref 135–145)

## 2020-08-22 LAB — MAGNESIUM: Magnesium: 3.5 mg/dL — ABNORMAL HIGH (ref 1.7–2.4)

## 2020-08-22 LAB — CBC
HCT: 38.3 % — ABNORMAL LOW (ref 39.0–52.0)
Hemoglobin: 13 g/dL (ref 13.0–17.0)
MCH: 33.3 pg (ref 26.0–34.0)
MCHC: 33.9 g/dL (ref 30.0–36.0)
MCV: 98.2 fL (ref 80.0–100.0)
Platelets: 470 10*3/uL — ABNORMAL HIGH (ref 150–400)
RBC: 3.9 MIL/uL — ABNORMAL LOW (ref 4.22–5.81)
RDW: 12.7 % (ref 11.5–15.5)
WBC: 23.4 10*3/uL — ABNORMAL HIGH (ref 4.0–10.5)
nRBC: 0 % (ref 0.0–0.2)

## 2020-08-22 LAB — APTT: aPTT: 84 seconds — ABNORMAL HIGH (ref 24–36)

## 2020-08-22 MED ORDER — PRISMASOL BGK 0/2.5 32-2.5 MEQ/L EC SOLN
Status: DC
  Filled 2020-08-22 (×13): qty 5000

## 2020-08-22 MED ORDER — SODIUM CHLORIDE 0.9 % IV SOLN
INTRAVENOUS | Status: DC | PRN
  Administered 2020-08-22: 500 mL via INTRAVENOUS

## 2020-08-22 MED ORDER — PRISMASOL BGK 0/2.5 32-2.5 MEQ/L EC SOLN
Status: DC
  Filled 2020-08-22 (×8): qty 5000

## 2020-08-22 MED ORDER — "THROMBI-PAD 3""X3"" EX PADS"
1.0000 | MEDICATED_PAD | Freq: Once | CUTANEOUS | Status: AC
  Administered 2020-08-22: 1 via TOPICAL
  Filled 2020-08-22: qty 1

## 2020-08-22 MED ORDER — METOCLOPRAMIDE HCL 5 MG/ML IJ SOLN
10.0000 mg | Freq: Four times a day (QID) | INTRAMUSCULAR | Status: AC
  Administered 2020-08-22 – 2020-08-25 (×12): 10 mg via INTRAVENOUS
  Filled 2020-08-22 (×13): qty 2

## 2020-08-22 NOTE — Progress Notes (Signed)
Patient placed on trach collar 10L/60% per MD. Tyler [name] well.

## 2020-08-22 NOTE — Plan of Care (Signed)
  Problem: Clinical Measurements: Goal: Respiratory complications Tyler [name] improve Outcome: Progressing   Problem: Clinical Measurements: Goal: Cardiovascular complication Tyler [name] be avoided Outcome: Progressing   Problem: Nutrition: Goal: Adequate nutrition Tyler [name] be maintained Outcome: Progressing   Problem: Elimination: Goal: Tyler [name] not experience complications related to bowel motility Outcome: Progressing Goal: Tyler [name] not experience complications related to urinary retention Outcome: Progressing   Problem: Skin Integrity: Goal: Risk for impaired skin integrity Tyler [name] decrease Outcome: Progressing

## 2020-08-22 NOTE — Progress Notes (Signed)
Patient had de sating issue.  Tried increasing FI02.  Patient seemed wore out and with declining SP02 I placed patient back on full support.  Patient needs to rest a few hours so he Sage be wean ready this morning.  RN aware of change.

## 2020-08-22 NOTE — Progress Notes (Signed)
[facility] NEPHROLOGY PROGRESS NOTE  Assessment/ Plan:  # Oligo-anuric AKI- presumably ischemic ATN in setting of PEA arrest requiring multiple rounds of CPR and pressors as well as contrast induced nephropathy following CT angio. Started on CRRT 08/14/20. Left IJ temp catheter placed 08/14/20 by PCCM-  Then replaced due to malfunction on 12/24. Potassium level elevated to 5.3.  Change all bath to 2K.  Tolerating CRRT well.  On heparin for anticoagulation.  CVP not elevated therefore change UF goal to net 0 to 50 cc/hour.  No meaningful mental recovery with CRRT. Suspected anoxic brain injury and poor prognosis.  Palliative care consulted.  If no improvement in mental status then he is not a candidate for outpatient dialysis.  Poor prognosis therefore I recommend comfort measures/hospice care.  Discussed with both PCCM and palliative care team today.  # Acute Hypoxic respiratory failure s/p emergent cric and s/p trach on vent per PCCM. Likely ARDS. still hypoxic  # Acute metabolic encephalopathy- due to severe hypoxic-ischemic encephalopathy per Neuro. no meaningful improvement even after trial of CRRT.  Poor prognosis.    # Aspiration pneumonia with sepsis on abx per PCCM.  # Atrial fibrillation with RVR- currently rate controlled.  # Anemia of acute illness- not an issue.  Subjective: Tolerating CRRT well.  UF even mostly.  No meaningful clinical improvement.  Not responding.  No new event.  Objective Vital signs in last 24 hours: Vitals:   08/22/20 0500 08/22/20 0600 08/22/20 0700 08/22/20 0812  BP: (!) 130/98     Pulse: 83 86 83 80  Resp: (!) 26 (!) 23 (!) 22 20  Temp:      TempSrc:      SpO2: 96% 95% 95% 93%  Weight: 107 kg     Height:       Weight change: -5.7 kg  Intake/Output Summary (Last 24 hours) at 08/22/2020 0849 Last data filed at 08/22/2020 0700 Gross per 24 hour  Intake 863 ml  Output 2976 ml  Net -2113 ml       Labs: Basic  Metabolic Panel: Recent Labs  Lab 08/21/20 0346 08/21/20 1606 08/22/20 0336  NA 135 135 133*  K 4.6 5.1 5.3*  CL 99 98 94*  CO2 '22 23 23  ' GLUCOSE 160* 148* 159*  BUN 70* 70* 70*  CREATININE 3.71* 3.77* 3.90*  CALCIUM 9.2 9.3 9.5  PHOS 6.5* 7.2* 7.6*   Liver Function Tests: Recent Labs  Lab 08/16/20 0427 08/16/20 1548 08/21/20 0346 08/21/20 1606 08/22/20 0336  AST 45*  --   --   --   --   ALT 71*  --   --   --   --   ALKPHOS 144*  --   --   --   --   BILITOT 0.7  --   --   --   --   PROT 7.5  --   --   --   --   ALBUMIN 2.4*  2.3*   < > 2.8* 3.1* 3.2*   < > = values in this interval not displayed.   No results for input(s): LIPASE, AMYLASE in the last 168 hours. Recent Labs  Lab 08/15/20 1031  AMMONIA 48*   CBC: Recent Labs  Lab 08/17/20 0426 08/17/20 1031 08/18/20 0355 08/18/20 0415 08/19/20 0429 08/20/20 0413 08/22/20 0336  WBC 11.0*  --  19.3*  --  19.3* 17.8* 23.4*  HGB 11.2*   < > 11.0*   < > 11.6* 11.6*  13.0  HCT 33.0*   < > 33.8*   < > 35.3* 34.0* 38.3*  MCV 98.2  --  102.7*  --  100.3* 98.3 98.2  PLT 202  --  231  --  320 366 470*   < > = values in this interval not displayed.   Cardiac Enzymes: No results for input(s): CKTOTAL, CKMB, CKMBINDEX, TROPONINI in the last 168 hours. CBG: Recent Labs  Lab 08/21/20 1124 08/21/20 1558 08/21/20 1936 08/22/20 0059 08/22/20 0338  GLUCAP 184* 142* 161* 138* 164*    Iron Studies: No results for input(s): IRON, TIBC, TRANSFERRIN, FERRITIN in the last 72 hours. Studies/Results: DG Abd 1 View  Result Date: 08/21/2020 CLINICAL DATA:  Enteric catheter replacement EXAM: ABDOMEN - 1 VIEW COMPARISON:  08/12/2020 FINDINGS: Frontal view of the lower chest and abdomen are obtained, excluding the left flank by collimation. There are 2 enteric catheters. Proximal catheter tip and side port project over the gastric fundus. The larger distal catheter tip projects over the gastric antrum. Dilated gas-filled loops  of small bowel are identified consistent with small-bowel obstruction or ileus, measuring up to 3.8 cm in diameter. Right femoral catheter tip projects over the expected region of the right common iliac vein. No acute bony abnormalities. IMPRESSION: 1. Enteric catheters as above. 2. Distended gas-filled loops of small bowel consistent with small bowel obstruction or ileus. Electronically Signed   By: Randa Ngo M.D.   On: 08/21/2020 18:39    Medications: Infusions: . feeding supplement (VITAL 1.5 CAL) Stopped (08/21/20 1800)  . heparin 10,000 units/ 20 mL infusion syringe 1,650 Units/hr (08/22/20 0828)  . norepinephrine (LEVOPHED) Adult infusion Stopped (08/18/20 0046)  . phenylephrine (NEO-SYNEPHRINE) Adult infusion Stopped (08/17/20 2237)  . piperacillin-tazobactam Stopped (08/22/20 0557)  . prismasol BGK 2/2.5 dialysis solution 1,500 mL/hr at 08/22/20 0654  . prismasol BGK 2/2.5 replacement solution 500 mL/hr at 08/22/20 0745  . prismasol BGK 2/2.5 replacement solution 300 mL/hr at 08/22/20 0745    Scheduled Medications: . alteplase  2 mg Intracatheter Once  . aspirin  81 mg Per Tube Daily  . B-complex with vitamin C  1 tablet Oral Daily  . chlorhexidine gluconate (MEDLINE KIT)  15 mL Mouth Rinse BID  . Chlorhexidine Gluconate Cloth  6 each Topical Daily  . feeding supplement (PROSource TF)  90 mL Per Tube TID  . fiber  1 packet Per Tube BID  . folic acid  1 mg Per Tube Daily  . heparin injection (subcutaneous)  5,000 Units Subcutaneous Q8H  . insulin aspart  0-9 Units Subcutaneous Q4H  . mouth rinse  15 mL Mouth Rinse 10 times per day  . metoCLOPramide (REGLAN) injection  10 mg Intravenous Q6H  . pantoprazole (PROTONIX) IV  40 mg Intravenous QHS  . sodium chloride flush  10-40 mL Intracatheter Q12H  . thiamine  100 mg Per Tube Daily    have reviewed scheduled and prn medications.  Physical Exam: General: On vent via trach, sedated and unresponsive Heart:RRR, s1s2  nl Lungs: Coarse breath sound bilateral Abdomen:soft, Non-tender, non-distended Extremities: Trace edema Dialysis Access: IJ cath placed on 12/21.  [place] Timothy Prasad Caralynn Gelber 08/22/2020,8:49 AM  LOS: 13 days

## 2020-08-22 NOTE — Progress Notes (Signed)
Pt has been continuously alarming and unsynchrony with  The ventilator while back on full support.  Placed pt back on CP/PS. Tolerating well at this time.  Requiring FI02 of 100% to maintain SP02 of 96.
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
## 2020-08-23 ENCOUNTER — Inpatient Hospital Stay (HOSPITAL_COMMUNITY): Payer: Self-pay

## 2020-08-23 DIAGNOSIS — G931 Anoxic brain damage, not elsewhere classified: Secondary | ICD-10-CM

## 2020-08-23 LAB — GLUCOSE, CAPILLARY
Glucose-Capillary: 154 mg/dL — ABNORMAL HIGH (ref 70–99)
Glucose-Capillary: 149 mg/dL — ABNORMAL HIGH (ref 70–99)
Glucose-Capillary: 174 mg/dL — ABNORMAL HIGH (ref 70–99)
Glucose-Capillary: 188 mg/dL — ABNORMAL HIGH (ref 70–99)
Glucose-Capillary: 170 mg/dL — ABNORMAL HIGH (ref 70–99)
Glucose-Capillary: 259 mg/dL — ABNORMAL HIGH (ref 70–99)
Glucose-Capillary: 214 mg/dL — ABNORMAL HIGH (ref 70–99)
Glucose-Capillary: 158 mg/dL — ABNORMAL HIGH (ref 70–99)
Glucose-Capillary: 193 mg/dL — ABNORMAL HIGH (ref 70–99)

## 2020-08-23 LAB — RENAL FUNCTION PANEL
Albumin: 3.3 g/dL — ABNORMAL LOW (ref 3.5–5.0)
Albumin: 3.4 g/dL — ABNORMAL LOW (ref 3.5–5.0)
Anion gap: 14 (ref 5–15)
Anion gap: 15 (ref 5–15)
BUN: 66 mg/dL — ABNORMAL HIGH (ref 6–20)
BUN: 66 mg/dL — ABNORMAL HIGH (ref 6–20)
CO2: 23 mmol/L (ref 22–32)
CO2: 22 mmol/L (ref 22–32)
Calcium: 9.4 mg/dL (ref 8.9–10.3)
Calcium: 9.6 mg/dL (ref 8.9–10.3)
Chloride: 95 mmol/L — ABNORMAL LOW (ref 98–111)
Chloride: 97 mmol/L — ABNORMAL LOW (ref 98–111)
Creatinine, Ser: 3.39 mg/dL — ABNORMAL HIGH (ref 0.61–1.24)
Creatinine, Ser: 3.61 mg/dL — ABNORMAL HIGH (ref 0.61–1.24)
GFR, Estimated: 21 mL/min — ABNORMAL LOW (ref >60)
GFR, Estimated: 20 mL/min — ABNORMAL LOW (ref >60)
Glucose, Bld: 155 mg/dL — ABNORMAL HIGH (ref 70–99)
Glucose, Bld: 156 mg/dL — ABNORMAL HIGH (ref 70–99)
Phosphorus: 6.6 mg/dL — ABNORMAL HIGH (ref 2.5–4.6)
Phosphorus: 7 mg/dL — ABNORMAL HIGH (ref 2.5–4.6)
Potassium: 4.2 mmol/L (ref 3.5–5.1)
Potassium: 4.1 mmol/L (ref 3.5–5.1)
Sodium: 132 mmol/L — ABNORMAL LOW (ref 135–145)
Sodium: 134 mmol/L — ABNORMAL LOW (ref 135–145)

## 2020-08-23 LAB — MAGNESIUM: Magnesium: 3.4 mg/dL — ABNORMAL HIGH (ref 1.7–2.4)

## 2020-08-23 LAB — POCT ACTIVATED CLOTTING TIME
Activated Clotting Time: 202 seconds
Activated Clotting Time: 208 seconds
Activated Clotting Time: 202 seconds
Activated Clotting Time: 196 seconds
Activated Clotting Time: 202 seconds
Activated Clotting Time: 214 seconds

## 2020-08-23 LAB — APTT: aPTT: 67 seconds — ABNORMAL HIGH (ref 24–36)
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
## 2020-08-23 NOTE — Progress Notes (Signed)
[facility] NEPHROLOGY PROGRESS NOTE  Assessment/ Plan:  # Oligo-anuric AKI- presumably ischemic ATN in setting of PEA arrest requiring multiple rounds of CPR and pressors as well as contrast induced nephropathy following CT angio. Started on CRRT 08/14/20. Left IJ temp catheter placed 08/14/20 by PCCM-  Then replaced due to malfunction on 12/24. Potassium level improved on all 2K bath.  So far tolerating CRRT well, heparin for anticoagulation.  Volume is not issue therefore UF goal needed 0 to 50 cc/hour.   No meaningful mental recovery with CRRT. Suspected anoxic brain injury and poor prognosis.  CRRT is not helping to recover his mental status.  Not a candidate for outpatient dialysis.  I recommend comfort measures/hospice care.  Seen by palliative care and plan to meet with family today.  I have discussed with ICU team and palliative care team today as well.  # Acute Hypoxic respiratory failure s/p emergent cric and s/p trach on vent per PCCM. Likely ARDS. still hypoxic  # Acute metabolic encephalopathy- due to severe hypoxic-ischemic encephalopathy per Neuro. No meaningful improvement even after trial of CRRT.  Poor prognosis.    # Aspiration pneumonia with sepsis on abx per PCCM.  # Atrial fibrillation with RVR- currently rate controlled.  # Anemia of acute illness- not an issue.  Subjective: Tolerating CRRT well.  No improvement in mental status.  No new event.  Remains on vent. Objective Vital signs in last 24 hours: Vitals:   08/23/20 0500 08/23/20 0600 08/23/20 0700 08/23/20 0800  BP: 116/79 137/86 139/82 115/79  Pulse: 97 97 96 97  Resp: (!) 25 (!) 28 (!) 30 (!) 27  Temp:    99 F (37.2 C)  TempSrc:    Axillary  SpO2: 98% 100% 95% 96%  Weight:      Height:       Weight change: -0.7 kg  Intake/Output Summary (Last 24 hours) at 08/23/2020 0823 Last data filed at 08/23/2020 0800 Gross per 24 hour  Intake 1561.06 ml  Output 1988 ml  Net -426.94 ml        Labs: Basic Metabolic Panel: Recent Labs  Lab 08/21/20 1606 08/22/20 0336 08/23/20 0357  NA 135 133* 132*  K 5.1 5.3* 4.2  CL 98 94* 95*  CO2 '23 23 23  ' GLUCOSE 148* 159* 155*  BUN 70* 70* 66*  CREATININE 3.77* 3.90* 3.39*  CALCIUM 9.3 9.5 9.4  PHOS 7.2* 7.6* 6.6*   Liver Function Tests: Recent Labs  Lab 08/21/20 1606 08/22/20 0336 08/23/20 0357  ALBUMIN 3.1* 3.2* 3.3*   No results for input(s): LIPASE, AMYLASE in the last 168 hours. No results for input(s): AMMONIA in the last 168 hours. CBC: Recent Labs  Lab 08/17/20 0426 08/17/20 1031 08/18/20 0355 08/18/20 0415 08/19/20 0429 08/20/20 0413 08/22/20 0336  WBC 11.0*  --  19.3*  --  19.3* 17.8* 23.4*  HGB 11.2*   < > 11.0*   < > 11.6* 11.6* 13.0  HCT 33.0*   < > 33.8*   < > 35.3* 34.0* 38.3*  MCV 98.2  --  102.7*  --  100.3* 98.3 98.2  PLT 202  --  231  --  320 366 470*   < > = values in this interval not displayed.   Cardiac Enzymes: No results for input(s): CKTOTAL, CKMB, CKMBINDEX, TROPONINI in the last 168 hours. CBG: Recent Labs  Lab 08/22/20 1154 08/22/20 1537 08/22/20 1948 08/22/20 2332 08/23/20 0355  GLUCAP 149* 152* 188* 174* 154*  Iron Studies: No results for input(s): IRON, TIBC, TRANSFERRIN, FERRITIN in the last 72 hours. Studies/Results: DG Abd 1 View  Result Date: 08/21/2020 CLINICAL DATA:  Enteric catheter replacement EXAM: ABDOMEN - 1 VIEW COMPARISON:  08/12/2020 FINDINGS: Frontal view of the lower chest and abdomen are obtained, excluding the left flank by collimation. There are 2 enteric catheters. Proximal catheter tip and side port project over the gastric fundus. The larger distal catheter tip projects over the gastric antrum. Dilated gas-filled loops of small bowel are identified consistent with small-bowel obstruction or ileus, measuring up to 3.8 cm in diameter. Right femoral catheter tip projects over the expected region of the right common iliac vein. No acute bony  abnormalities. IMPRESSION: 1. Enteric catheters as above. 2. Distended gas-filled loops of small bowel consistent with small bowel obstruction or ileus. Electronically Signed   By: Randa Ngo M.D.   On: 08/21/2020 18:39   DG CHEST PORT 1 VIEW  Result Date: 08/23/2020 CLINICAL DATA:  ARDS with respiratory failure EXAM: PORTABLE CHEST 1 VIEW COMPARISON:  August 20, 2020 FINDINGS: Tracheostomy catheter tip is 5.9 cm above the carina. Nasogastric tube tip and side port are below the diaphragm. Central catheter tip is in the superior vena cava. No pneumothorax. There is no edema or airspace opacity. Heart is mildly enlarged with pulmonary vascularity within normal limits. No adenopathy. No bone lesions. IMPRESSION: Tube and catheter positions as described without pneumothorax. No edema or airspace opacity. Stable cardiac prominence. Electronically Signed   By: Lowella Grip III M.D.   On: 08/23/2020 07:54    Medications: Infusions: . sodium chloride Stopped (08/23/20 0618)  . feeding supplement (VITAL 1.5 CAL) 50 mL/hr at 08/22/20 1426  . heparin 10,000 units/ 20 mL infusion syringe 1,400 Units/hr (08/23/20 0810)  . norepinephrine (LEVOPHED) Adult infusion Stopped (08/18/20 0046)  . phenylephrine (NEO-SYNEPHRINE) Adult infusion Stopped (08/17/20 2237)  . prismasol BGK 2/2.5 dialysis solution 1,500 mL/hr at 08/23/20 7673  . prismasol BGK 2/2.5 replacement solution 500 mL/hr at 08/23/20 0421  . prismasol BGK 2/2.5 replacement solution 300 mL/hr at 08/23/20 0049    Scheduled Medications: . alteplase  2 mg Intracatheter Once  . aspirin  81 mg Per Tube Daily  . B-complex with vitamin C  1 tablet Oral Daily  . chlorhexidine gluconate (MEDLINE KIT)  15 mL Mouth Rinse BID  . Chlorhexidine Gluconate Cloth  6 each Topical Daily  . feeding supplement (PROSource TF)  90 mL Per Tube TID  . fiber  1 packet Per Tube BID  . folic acid  1 mg Per Tube Daily  . heparin injection (subcutaneous)  5,000  Units Subcutaneous Q8H  . insulin aspart  0-9 Units Subcutaneous Q4H  . mouth rinse  15 mL Mouth Rinse 10 times per day  . metoCLOPramide (REGLAN) injection  10 mg Intravenous Q6H  . pantoprazole (PROTONIX) IV  40 mg Intravenous QHS  . sodium chloride flush  10-40 mL Intracatheter Q12H  . thiamine  100 mg Per Tube Daily    have reviewed scheduled and prn medications.  Physical Exam: General: On vent via trach, sedated and unresponsive, unchanged from yesterday Heart:RRR, s1s2 nl Lungs: Coarse breath sound bilateral Abdomen:soft, Non-tender, non-distended Extremities: Trace edema Dialysis Access: IJ cath placed on 12/21.  Mikhaela Zaugg Prasad Gaytha Raybourn 08/23/2020,8:23 AM  LOS: 14 days

## 2020-08-23 NOTE — Consult Note (Addendum)
Neurology Consultation  Reason for Consult: Anoxic brain injury/assess with family decision making  Referring Physician: Dr. Lynetta Mare  CC: PEA arrest with unknown down time  History is obtained from: Medical record   HPI: Tyler [name] is a 49 y.o. male with a past medical history of GERD, PTSD and esophageal stricture who presents with a witnessed out of hospital PEA arrest that occurred while lifting a heavy object overhead while at work.  Per record, patientt was difficult to ventilate with BVM and required an emergent cricothyroidotomy by EMS with unknown total downtime. Neurology consulted to help family with decision making.    ROS: Unable to obtain due to altered mental status.   History reviewed. No pertinent past medical history.   History reviewed. No pertinent family history.   Social History:   reports that he has quit smoking. He has never used smokeless tobacco. He reports current alcohol use. He reports previous drug use.  Medications  Current Facility-Administered Medications:  .  0.9 %  sodium chloride infusion, , Intravenous, PRN, Kipp Brood, MD, Stopped at 08/23/20 1104 .  acetaminophen (TYLENOL) tablet 650 mg, 650 mg, Oral, Q4H PRN **OR** acetaminophen (TYLENOL) 160 MG/5ML solution 650 mg, 650 mg, Per Tube, Q4H PRN, 650 mg at 08/17/20 2141 **OR** acetaminophen (TYLENOL) suppository 650 mg, 650 mg, Rectal, Q4H PRN, Mosetta Anis, MD .  alteplase (CATHFLO ACTIVASE) injection 2 mg, 2 mg, Intracatheter, Once, Mannam, Praveen, MD .  aspirin chewable tablet 81 mg, 81 mg, Per Tube, Daily, Chesley Mires, MD, 81 mg at 08/23/20 0944 .  B-complex with vitamin C tablet 1 tablet, 1 tablet, Oral, Daily, Audria Nine, DO, 1 tablet at 08/23/20 0944 .  chlorhexidine gluconate (MEDLINE KIT) (PERIDEX) 0.12 % solution 15 mL, 15 mL, Mouth Rinse, BID, Agarwala, Ravi, MD, 15 mL at 08/23/20 0744 .  Chlorhexidine Gluconate Cloth 2 % PADS 6 each, 6 each, Topical, Daily, Kipp Brood, MD, 6 each at 08/22/20 2100 .  docusate (COLACE) 50 MG/5ML liquid 100 mg, 100 mg, Per Tube, Daily PRN, Einar Grad, RPH .  feeding supplement (PROSource TF) liquid 90 mL, 90 mL, Per Tube, TID, Agarwala, Ravi, MD, 90 mL at 08/23/20 0944 .  feeding supplement (VITAL 1.5 CAL) liquid 1,000 mL, 1,000 mL, Per Tube, Continuous, Audria Nine, DO, Last Rate: 50 mL/hr at 08/23/20 0944, 1,000 mL at 08/23/20 0944 .  fiber (NUTRISOURCE FIBER) 1 packet, 1 packet, Per Tube, BID, Kipp Brood, MD, 1 packet at 08/23/20 0944 .  folic acid (FOLVITE) tablet 1 mg, 1 mg, Per Tube, Daily, 1 mg at 08/23/20 0944 **OR** [DISCONTINUED] folic acid 1 mg in sodium chloride 0.9 % 50 mL IVPB, 1 mg, Intravenous, Daily, Jennelle Human B, NP, Stopped at 08/10/20 1403 .  heparin 10,000 units/ 20 mL infusion syringe, 250-3,000 Units/hr, CRRT, Continuous, Corliss Parish, MD, Last Rate: 2.8 mL/hr at 08/23/20 0810, 1,400 Units/hr at 08/23/20 0810 .  heparin bolus via infusion syringe 1,000 Units, 1,000 Units, CRRT, PRN, Corliss Parish, MD .  heparin injection 1,000-6,000 Units, 1,000-6,000 Units, CRRT, PRN, Donato Heinz, MD, 1,700 Units at 08/17/20 1059 .  heparin injection 5,000 Units, 5,000 Units, Subcutaneous, Q8H, Jennelle Human B, NP, 5,000 Units at 08/23/20 0511 .  heparinized saline (2000 units/L) primer fluid for CRRT, , CRRT, PRN, Einar Grad, RPH .  hydrALAZINE (APRESOLINE) injection 10 mg, 10 mg, Intravenous, Q4H PRN, Gleason, Otilio Carpen, PA-C, 10 mg at 08/21/20 0830 .  HYDROmorphone (DILAUDID) injection 1 mg, 1 mg,  Intravenous, Q2H PRN, Kipp Brood, MD, 1 mg at 08/22/20 0050 .  insulin aspart (novoLOG) injection 0-9 Units, 0-9 Units, Subcutaneous, Q4H, Jennelle Human B, NP, 3 Units at 08/23/20 1107 .  MEDLINE mouth rinse, 15 mL, Mouth Rinse, 10 times per day, Agarwala, Ravi, MD, 15 mL at 08/23/20 1108 .  metoCLOPramide (REGLAN) injection 10 mg, 10 mg, Intravenous, Q6H, Agarwala, Ravi,  MD, 10 mg at 08/23/20 1105 .  prismasol BGK 2/2.5 dialysis solution, , CRRT, Continuous, Rosita Fire, MD, Last Rate: 1,500 mL/hr at 08/23/20 1115, New Bag at 08/23/20 1115 .  prismasol BGK 2/2.5 replacement solution, , CRRT, Continuous, Rosita Fire, MD, Last Rate: 500 mL/hr at 08/23/20 0421, New Bag at 08/23/20 0421 .  prismasol BGK 2/2.5 replacement solution, , CRRT, Continuous, Rosita Fire, MD, Last Rate: 300 mL/hr at 08/23/20 0049, New Bag at 08/23/20 0049 .  sodium chloride flush (NS) 0.9 % injection 10-40 mL, 10-40 mL, Intracatheter, Q12H, Agarwala, Ravi, MD, 10 mL at 08/23/20 0945 .  sodium chloride flush (NS) 0.9 % injection 10-40 mL, 10-40 mL, Intracatheter, PRN, Kipp Brood, MD, 10 mL at 08/20/20 0916 .  [DISCONTINUED] thiamine (B-1) injection 100 mg, 100 mg, Intravenous, Daily, 100 mg at 08/14/2020 0944 **OR** thiamine tablet 100 mg, 100 mg, Per Tube, Daily, Jennelle Human B, NP, 100 mg at 08/23/20 0944   Exam: Current vital signs: BP 113/81   Pulse 95   Temp 99 F (37.2 C) (Axillary)   Resp (!) 27   Ht '5\' 8"'  (1.727 m)   Wt 106.3 kg   SpO2 98%   BMI 35.63 kg/m  Vital signs in last 24 hours: Temp:  [97.9 F (36.6 C)-99 F (37.2 C)] 99 F (37.2 C) (12/30 0800) Pulse Rate:  [82-102] 95 (12/30 1200) Resp:  [20-36] 27 (12/30 1200) BP: (99-139)/(63-90) 113/81 (12/30 1200) SpO2:  [94 %-100 %] 98 % (12/30 1200) FiO2 (%):  [28 %-40 %] 28 % (12/30 1120) Weight:  [106.3 kg] 106.3 kg (12/30 0431)  GENERAL: lying in bed via trach collar, on CRRT, in NAD.  HENT: - Trach tube in place with trach collar, with bloody secretions . Normocephalic and atraumatic, dry mm. LUNGS - Clear to auscultation bilaterally with no wheezes CV - S1S2 RRR, no m/r/g, equal pulses bilaterally. ABDOMEN - large rounded, Soft, nontender, nondistended with normoactive BS Ext: warm, well perfused, intact peripheral pulses, no edema  NEURO:  Mental Status: unresponsive to  voice. Does not open eyes to voice or stimuli, does not track  Language: Unable to assess due to mental status and trach tube Cranial Nerves: PERRL 4m/brisk. No blink to threat or bright light. Doll's eye reflex intact. Corneal reflexes intact. No facial asymmetry. Strong cough/gag Motor: withdraws in all four extremities to painful stimuli.  Tone: is normal and bulk is normal Sensation- Intact to painful stimuli Coordination/Gait: Unable to assess   Labs I have reviewed labs in epic and the results pertinent to this consultation are:  CBC    Component Value Date/Time   WBC 23.4 (H) 08/22/2020 0336   RBC 3.90 (L) 08/22/2020 0336   HGB 13.0 08/22/2020 0336   HCT 38.3 (L) 08/22/2020 0336   PLT 470 (H) 08/22/2020 0336   MCV 98.2 08/22/2020 0336   MCH 33.3 08/22/2020 0336   MCHC 33.9 08/22/2020 0336   RDW 12.7 08/22/2020 0336   LYMPHSABS 3.7 08/21/2020 1805   MONOABS 1.2 (H) 07/31/2020 1805   EOSABS 0.5 08/15/2020 1805   BASOSABS  0.0 07/27/2020 1805    CMP     Component Value Date/Time   NA 132 (L) 08/23/2020 0357   K 4.2 08/23/2020 0357   CL 95 (L) 08/23/2020 0357   CO2 23 08/23/2020 0357   GLUCOSE 155 (H) 08/23/2020 0357   BUN 66 (H) 08/23/2020 0357   CREATININE 3.39 (H) 08/23/2020 0357   CALCIUM 9.4 08/23/2020 0357   PROT 7.5 08/16/2020 0427   ALBUMIN 3.3 (L) 08/23/2020 0357   AST 45 (H) 08/16/2020 0427   ALT 71 (H) 08/16/2020 0427   ALKPHOS 144 (H) 08/16/2020 0427   BILITOT 0.7 08/16/2020 0427   GFRNONAA 21 (L) 08/23/2020 0357    Lipid Panel     Component Value Date/Time   CHOL 268 (H) 08/15/2020 1805   TRIG 362 (H) 08/17/2020 0426   HDL 31 (L) 08/17/2020 1805   CHOLHDL 8.6 08/23/2020 1805   VLDL UNABLE TO CALCULATE IF TRIGLYCERIDE OVER 400 mg/dL 07/29/2020 1805   LDLCALC UNABLE TO CALCULATE IF TRIGLYCERIDE OVER 400 mg/dL 07/25/2020 1805   LDLDIRECT 154.4 (H) 08/06/2020 1805     Imaging I have reviewed the images obtained:  CT-scan of the brain 12/16  no acute abnormality   Assessment: 49 y.o. male with a past medical history of GERD, PTSD and esophageal stricture who presents with a witnessed out of hospital PEA arrest that occurred while lifting a heavy object overhead while at work.  Per record, patientt was difficult to ventilate with BVM and required an emergent cricothyroidotomy by EMS with unknown total downtime. He underwent TTM on 08/21/2020 and has been on CRRT since 08/14/20 with no improvement in neurological status.  Patient has not received any sedation in over 72 hours.  - Exam findings are most consistent with a comatose state with preserved brainstem reflexes.  - Most recent EEG on 12/19 revealed continuous generalized slowing - Overall neurological presentation most consistent with anoxic brain injury.   Recommendations: - Continue to limit sedating medications as possible - Consider repeat brain imaging with MRI to help further define brain injury  - Consider repeat EEG - Keep normothermic, normoglycemic and normotensive - Continue to correct metabolic abnormalities - Continue to treat infections per primary team - Neurology Edvin follow  Beulah Gandy, DNP, ACNPC-AG  I have seen and examined the patient. I have formulated the assessment and recommendations. 49 y.o. male with a past medical history of GERD, PTSD and esophageal stricture who presents with a witnessed out of hospital PEA arrest that occurred while lifting a heavy object overhead while at work.  Per record, patientt was difficult to ventilate with BVM and required an emergent cricothyroidotomy by EMS with unknown total downtime. He underwent TTM on 08/02/2020 and has been on CRRT since 08/14/20 with no improvement in neurological status.  Patient has not received any sedation in over 72 hours. Exam findings are most consistent with a comatose state with preserved brainstem reflexes. Overall neurological presentation most consistent with anoxic brain injury.  Recommendations as above.  Electronically signed: Dr. Kerney Elbe

## 2020-08-23 NOTE — Plan of Care (Signed)
  Discussed pt's current temp HD catheter + intermittent alarming of CRRT circuit with pts RN. Catheter appears to have small bend.   Merdith keep current temp HD catheter as it is continuing to function properly.  Tessie Fass MSN, AGACNP-BC [facility] Pulmonary/Critical Care Medicine 08/23/2020, 2:22 PM
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
## 2020-08-24 ENCOUNTER — Encounter (HOSPITAL_COMMUNITY): Admission: EM | Disposition: E | Payer: Self-pay | Source: Home / Self Care | Attending: Pulmonary Disease

## 2020-08-24 ENCOUNTER — Inpatient Hospital Stay (HOSPITAL_COMMUNITY): Payer: Self-pay

## 2020-08-24 ENCOUNTER — Inpatient Hospital Stay (HOSPITAL_COMMUNITY): Payer: Self-pay | Admitting: Certified Registered Nurse Anesthetist

## 2020-08-24 HISTORY — PX: TRACHEOSTOMY TUBE PLACEMENT: SHX814

## 2020-08-24 LAB — CBC WITH DIFFERENTIAL/PLATELET
Abs Immature Granulocytes: 0.73 10*3/uL — ABNORMAL HIGH (ref 0.00–0.07)
Basophils Absolute: 0.1 10*3/uL (ref 0.0–0.1)
Basophils Relative: 1 %
Eosinophils Absolute: 0.3 10*3/uL (ref 0.0–0.5)
Eosinophils Relative: 2 %
HCT: 39 % (ref 39.0–52.0)
Hemoglobin: 13 g/dL (ref 13.0–17.0)
Immature Granulocytes: 4 %
Lymphocytes Relative: 12 %
Lymphs Abs: 2.4 10*3/uL (ref 0.7–4.0)
MCH: 32.5 pg (ref 26.0–34.0)
MCHC: 33.3 g/dL (ref 30.0–36.0)
MCV: 97.5 fL (ref 80.0–100.0)
Monocytes Absolute: 2.1 10*3/uL — ABNORMAL HIGH (ref 0.1–1.0)
Monocytes Relative: 11 %
Neutro Abs: 14.4 10*3/uL — ABNORMAL HIGH (ref 1.7–7.7)
Neutrophils Relative %: 70 %
Platelets: 393 10*3/uL (ref 150–400)
RBC: 4 MIL/uL — ABNORMAL LOW (ref 4.22–5.81)
RDW: 12.3 % (ref 11.5–15.5)
WBC: 20 10*3/uL — ABNORMAL HIGH (ref 4.0–10.5)
nRBC: 0 % (ref 0.0–0.2)

## 2020-08-24 LAB — POCT ACTIVATED CLOTTING TIME
Activated Clotting Time: 166 seconds
Activated Clotting Time: 172 seconds
Activated Clotting Time: 172 seconds
Activated Clotting Time: 184 seconds
Activated Clotting Time: 190 seconds
Activated Clotting Time: 190 seconds
Activated Clotting Time: 201 seconds
Activated Clotting Time: 202 seconds
Activated Clotting Time: 202 seconds
Activated Clotting Time: 214 seconds

## 2020-08-24 LAB — RENAL FUNCTION PANEL
Albumin: 3.5 g/dL (ref 3.5–5.0)
Albumin: 3.3 g/dL — ABNORMAL LOW (ref 3.5–5.0)
Anion gap: 15 (ref 5–15)
Anion gap: 17 — ABNORMAL HIGH (ref 5–15)
BUN: 69 mg/dL — ABNORMAL HIGH (ref 6–20)
BUN: 77 mg/dL — ABNORMAL HIGH (ref 6–20)
CO2: 23 mmol/L (ref 22–32)
CO2: 19 mmol/L — ABNORMAL LOW (ref 22–32)
Calcium: 9.5 mg/dL (ref 8.9–10.3)
Calcium: 9.3 mg/dL (ref 8.9–10.3)
Chloride: 95 mmol/L — ABNORMAL LOW (ref 98–111)
Chloride: 96 mmol/L — ABNORMAL LOW (ref 98–111)
Creatinine, Ser: 3.81 mg/dL — ABNORMAL HIGH (ref 0.61–1.24)
Creatinine, Ser: 4.08 mg/dL — ABNORMAL HIGH (ref 0.61–1.24)
GFR, Estimated: 19 mL/min — ABNORMAL LOW (ref >60)
GFR, Estimated: 17 mL/min — ABNORMAL LOW (ref >60)
Glucose, Bld: 151 mg/dL — ABNORMAL HIGH (ref 70–99)
Glucose, Bld: 189 mg/dL — ABNORMAL HIGH (ref 70–99)
Phosphorus: 7.6 mg/dL — ABNORMAL HIGH (ref 2.5–4.6)
Phosphorus: 9 mg/dL — ABNORMAL HIGH (ref 2.5–4.6)
Potassium: 5.1 mmol/L (ref 3.5–5.1)
Potassium: 4.8 mmol/L (ref 3.5–5.1)
Sodium: 133 mmol/L — ABNORMAL LOW (ref 135–145)
Sodium: 132 mmol/L — ABNORMAL LOW (ref 135–145)

## 2020-08-24 LAB — PROTIME-INR
INR: 1.1 (ref 0.8–1.2)
Prothrombin Time: 13.9 seconds (ref 11.4–15.2)

## 2020-08-24 LAB — GLUCOSE, CAPILLARY
Glucose-Capillary: 154 mg/dL — ABNORMAL HIGH (ref 70–99)
Glucose-Capillary: 155 mg/dL — ABNORMAL HIGH (ref 70–99)
Glucose-Capillary: 158 mg/dL — ABNORMAL HIGH (ref 70–99)
Glucose-Capillary: 165 mg/dL — ABNORMAL HIGH (ref 70–99)
Glucose-Capillary: 167 mg/dL — ABNORMAL HIGH (ref 70–99)
Glucose-Capillary: 176 mg/dL — ABNORMAL HIGH (ref 70–99)
Glucose-Capillary: 197 mg/dL — ABNORMAL HIGH (ref 70–99)

## 2020-08-24 LAB — MAGNESIUM: Magnesium: 3.4 mg/dL — ABNORMAL HIGH (ref 1.7–2.4)

## 2020-08-24 LAB — APTT: aPTT: 70 seconds — ABNORMAL HIGH (ref 24–36)

## 2020-08-24 SURGERY — CREATION, TRACHEOSTOMY
Anesthesia: General | Site: Neck

## 2020-08-24 MED ORDER — "THROMBI-PAD 3""X3"" EX PADS"
1.0000 | MEDICATED_PAD | Freq: Once | CUTANEOUS | Status: AC
  Administered 2020-08-24: 1 via TOPICAL
  Filled 2020-08-24: qty 1

## 2020-08-24 MED ORDER — ONDANSETRON HCL 4 MG/2ML IJ SOLN
INTRAMUSCULAR | Status: DC | PRN
  Administered 2020-08-24: 4 mg via INTRAVENOUS

## 2020-08-24 MED ORDER — ROCURONIUM BROMIDE 10 MG/ML (PF) SYRINGE
PREFILLED_SYRINGE | INTRAVENOUS | Status: DC | PRN
  Administered 2020-08-24: 100 mg via INTRAVENOUS

## 2020-08-24 MED ORDER — 0.9 % SODIUM CHLORIDE (POUR BTL) OPTIME
TOPICAL | Status: DC | PRN
  Administered 2020-08-24: 1000 mL

## 2020-08-24 MED ORDER — PROPOFOL 10 MG/ML IV BOLUS
INTRAVENOUS | Status: DC | PRN
  Administered 2020-08-24: 100 mg via INTRAVENOUS
  Administered 2020-08-24: 50 mg via INTRAVENOUS

## 2020-08-24 MED ORDER — DEXMEDETOMIDINE (PRECEDEX) IN NS 20 MCG/5ML (4 MCG/ML) IV SYRINGE
PREFILLED_SYRINGE | INTRAVENOUS | Status: DC | PRN
  Administered 2020-08-24: 12 ug via INTRAVENOUS

## 2020-08-24 MED ORDER — SUGAMMADEX SODIUM 200 MG/2ML IV SOLN
INTRAVENOUS | Status: DC | PRN
  Administered 2020-08-24: 200 mg via INTRAVENOUS

## 2020-08-24 MED ORDER — PROPOFOL 10 MG/ML IV BOLUS
INTRAVENOUS | Status: AC
  Filled 2020-08-24: qty 20

## 2020-08-24 MED ORDER — DEXAMETHASONE SODIUM PHOSPHATE 10 MG/ML IJ SOLN
INTRAMUSCULAR | Status: DC | PRN
  Administered 2020-08-24: 10 mg via INTRAVENOUS

## 2020-08-24 MED ORDER — LACTATED RINGERS IV SOLN: INTRAVENOUS | Status: DC | PRN

## 2020-08-24 SURGICAL SUPPLY — 45 items
BLADE CLIPPER SURG (BLADE) IMPLANT
BLADE SURG 15 STRL LF DISP TIS (BLADE) IMPLANT
BLADE SURG 15 STRL SS (BLADE)
CANISTER SUCT 3000ML PPV (MISCELLANEOUS) ×2 IMPLANT
CLEANER TIP ELECTROSURG 2X2 (MISCELLANEOUS) ×2 IMPLANT
COAGULATOR SUCT SWTCH 10FR 6 (ELECTROSURGICAL) ×1 IMPLANT
COVER SURGICAL LIGHT HANDLE (MISCELLANEOUS) ×2 IMPLANT
COVER WAND RF STERILE (DRAPES) ×1 IMPLANT
DECANTER SPIKE VIAL GLASS SM (MISCELLANEOUS) ×1 IMPLANT
DRAPE HALF SHEET 40X57 (DRAPES) IMPLANT
ELECT COATED BLADE 2.86 ST (ELECTRODE) ×1 IMPLANT
ELECT REM PT RETURN 9FT ADLT (ELECTROSURGICAL) ×2
ELECTRODE REM PT RTRN 9FT ADLT (ELECTROSURGICAL) ×1 IMPLANT
GAUZE 4X4 16PLY RFD (DISPOSABLE) ×1 IMPLANT
GAUZE SPONGE 4X4 12PLY STRL (GAUZE/BANDAGES/DRESSINGS) ×1 IMPLANT
GAUZE XEROFORM 5X9 LF (GAUZE/BANDAGES/DRESSINGS) IMPLANT
GLOVE ECLIPSE 7.5 STRL STRAW (GLOVE) ×2 IMPLANT
GOWN STRL REUS W/ TWL LRG LVL3 (GOWN DISPOSABLE) ×3 IMPLANT
GOWN STRL REUS W/TWL LRG LVL3 (GOWN DISPOSABLE) ×6
HEMOSTAT SNOW SURGICEL 2X4 (HEMOSTASIS) ×1 IMPLANT
HOLDER TRACH TUBE VELCRO 19.5 (MISCELLANEOUS) ×1 IMPLANT
KIT BASIN OR (CUSTOM PROCEDURE TRAY) ×2 IMPLANT
KIT SUCTION CATH 14FR (SUCTIONS) IMPLANT
KIT TURNOVER KIT B (KITS) ×2 IMPLANT
NDL HYPO 25GX1X1/2 BEV (NEEDLE) ×1 IMPLANT
NEEDLE HYPO 25GX1X1/2 BEV (NEEDLE) IMPLANT
NS IRRIG 1000ML POUR BTL (IV SOLUTION) ×2 IMPLANT
PAD ARMBOARD 7.5X6 YLW CONV (MISCELLANEOUS) ×4 IMPLANT
PENCIL SMOKE EVACUATOR (MISCELLANEOUS) IMPLANT
SPONGE DRAIN TRACH 4X4 STRL 2S (GAUZE/BANDAGES/DRESSINGS) ×1 IMPLANT
SPONGE INTESTINAL PEANUT (DISPOSABLE) ×1 IMPLANT
SUT CHROMIC 3 0 SH 27 (SUTURE) IMPLANT
SUT PROLENE 2 0 SH DA (SUTURE) ×1 IMPLANT
SUT SILK 3 0 (SUTURE)
SUT SILK 3-0 18XBRD TIE 12 (SUTURE) ×1 IMPLANT
SYR 10ML LL (SYRINGE) ×2 IMPLANT
SYR 20ML LL LF (SYRINGE) IMPLANT
SYR BULB EAR ULCER 3OZ GRN STR (SYRINGE) ×1 IMPLANT
SYR CONTROL 10ML LL (SYRINGE) IMPLANT
TOWEL GREEN STERILE FF (TOWEL DISPOSABLE) ×2 IMPLANT
TRAY ENT MC OR (CUSTOM PROCEDURE TRAY) ×2 IMPLANT
TUBE CONNECTING 12X1/4 (SUCTIONS) ×2 IMPLANT
TUBE TRACH FLEX 8.0 CUFF (MISCELLANEOUS) ×2
TUBE TRACH FLEX 8.5 CUFF (MISCELLANEOUS) IMPLANT
WATER STERILE IRR 1000ML POUR (IV SOLUTION) ×1 IMPLANT

## 2020-08-24 NOTE — Progress Notes (Addendum)
Access pressure too negative - switched lines back

## 2020-08-24 NOTE — Op Note (Signed)
DATE OF PROCEDURE:  08/17/2020                              OPERATIVE REPORT  SURGEON:  Newman Pies, MD  PREOPERATIVE DIAGNOSES: 1.  Hemorrhage from tracheostomy stoma  POSTOPERATIVE DIAGNOSES: 1.  Hemorrhage from tracheostomy stoma  PROCEDURE PERFORMED: Exploration and control of postoperative hemorrhage from neck (tracheostomy stoma).  ANESTHESIA:  General endotracheal tube anesthesia.  COMPLICATIONS:  None.  ESTIMATED BLOOD LOSS:  Minimal.  INDICATION FOR PROCEDURE:  Tavarus B Zidek is a 49 y.o. male who underwent tracheostomy tube placement on December 18 to treat his respiratory failure.  Over the last few days, the patient has been experiencing increasing bleeding from his tracheostomy stoma.  Despite treatment with thrombin packing of his tracheostomy stoma, he continued to have bleeding from the tracheostomy tube and the stoma.  Based on the above findings, the decision was made for the patient to undergo the above-stated procedure. Likelihood of success in reducing symptoms was discussed with the daughter.  The risks, benefits, alternatives, and details of the procedure were discussed with the daughter.  Questions were invited and answered.  Informed consent was obtained.  DESCRIPTION:  The patient was taken to the operating room and placed supine on the operating table.  General endotracheal tube anesthesia was administered by the anesthesiologist.  The tracheostomy tube was removed.  The tracheostomy stoma was carefully debrided.  Diffuse oozing was noted from the left thyroid tissue.  The bleeding sources were extensively cauterized.   A #8 Cuffed Shiley tracheostomy tube was placed.  Surgicel was used to pack the stoma.  Circumferential neck dressing was placed.  The care of the patient was turned over to the anesthesiologist.  The patient was awakened from anesthesia without difficulty.  The patient was transferred to the ICU in good condition.  OPERATIVE FINDINGS: Diffuse oozing  is noted from the left thyroid lobe.  SPECIMEN:  None  FOLLOWUP CARE:  The patient Tyler [name] return to the ICU.  Verbie Babic W Zeola Brys 08/23/2020 11:02 AM
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
## 2020-08-24 NOTE — Progress Notes (Signed)
Increased tracheal bloody secretions requiring multiple dressing changes and suctioning.   Elink notified - holding 0600 subq heparin dose per Dr. Warrick Parisian.   Thrombin pads applied to backside of trach flange.
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
## 2020-08-24 NOTE — Plan of Care (Signed)
  Problem: Clinical Measurements: Goal: Ability to maintain clinical measurements within normal limits Pate improve Outcome: Progressing Goal: Seferino remain free from infection Outcome: Progressing Goal: Respiratory complications Ermin improve Outcome: Progressing Goal: Cardiovascular complication Dougles be avoided Outcome: Progressing   Problem: Nutrition: Goal: Adequate nutrition Julies be maintained Outcome: Progressing   Problem: Elimination: Goal: Trever not experience complications related to bowel motility Outcome: Progressing   Problem: Skin Integrity: Goal: Risk for impaired skin integrity Dravyn decrease Outcome: Progressing   Problem: Elimination: Goal: Clarence not experience complications related to urinary retention Outcome: Not Progressing

## 2020-08-24 NOTE — Anesthesia Postprocedure Evaluation (Signed)
Anesthesia Post Note  Patient: Tyler [name]  Procedure(s) Performed: EXPLORE TRACHEOSTOMY STOMA (N/A Neck)     Patient location during evaluation: SICU Anesthesia Type: General Level of consciousness: sedated Pain management: pain level controlled Vital Signs Assessment: post-procedure vital signs reviewed and stable Respiratory status: patient remains intubated per anesthesia plan Cardiovascular status: stable Postop Assessment: no apparent nausea or vomiting Anesthetic complications: no   No complications documented.  Last Vitals:  Vitals:   08/17/2020 1400 07/28/2020 1514  BP: 106/62   Pulse: 90   Resp: (!) 30   Temp:  37.3 C  SpO2: 96%     Last Pain:  Vitals:   07/30/2020 1514  TempSrc: Oral  PainSc:                  Nithila Sumners S

## 2020-08-24 NOTE — Anesthesia Preprocedure Evaluation (Signed)
Anesthesia Evaluation  Patient identified by MRN, date of birth, ID band Patient unresponsive    Reviewed: H&P , Patient's Chart, lab work & pertinent test results, Unable to perform ROS - Chart review only  Airway Mallampati: Trach  TM Distance: >3 FB Neck ROM: Full    Dental no notable dental hx.    Pulmonary former smoker,  Respiratory failure   Pulmonary exam normal breath sounds clear to auscultation       Cardiovascular Normal cardiovascular exam Rhythm:Regular Rate:Normal  S/P PEA arrest   Neuro/Psych negative neurological ROS  negative psych ROS   GI/Hepatic negative GI ROS, Neg liver ROS,   Endo/Other  Morbid obesity  Renal/GU ARF and CRFRenal disease  negative genitourinary   Musculoskeletal negative musculoskeletal ROS (+)   Abdominal   Peds negative pediatric ROS (+)  Hematology negative hematology ROS (+)   Anesthesia Other Findings   Reproductive/Obstetrics negative OB ROS                             Anesthesia Physical Anesthesia Plan  ASA: IV  Anesthesia Plan: General   Post-op Pain Management:    Induction: Intravenous  PONV Risk Score and Plan: 1 and Ondansetron  Airway Management Planned: Tracheostomy  Additional Equipment:   Intra-op Plan:   Post-operative Plan: Post-operative intubation/ventilation  Informed Consent: I have reviewed the patients History and Physical, chart, labs and discussed the procedure including the risks, benefits and alternatives for the proposed anesthesia with the patient or authorized representative who has indicated his/her understanding and acceptance.     Dental advisory given  Plan Discussed with: CRNA and Surgeon  Anesthesia Plan Comments:         Anesthesia Quick Evaluation

## 2020-08-24 NOTE — Progress Notes (Signed)
eLink Physician-Brief Progress Note Patient Name: Tyler [name] DOB: 1971-04-16 MRN: [id number]   Date of Service  08/08/2020  HPI/Events of Note  Bedside RN called to report concern about potential future problem with HD catheter, it is functioning well at this time.  eICU Interventions  No current intervention indicated.        Thomasene Lot Trevion Hoben 08/17/2020, 11:20 PM

## 2020-08-24 NOTE — Anesthesia Procedure Notes (Signed)
Procedure Name: Intubation Performed by: Ezekiel Ina, CRNA Pre-anesthesia Checklist: Patient identified, Emergency Drugs available, Suction available and Patient being monitored Patient Re-evaluated:Patient Re-evaluated prior to induction Oxygen Delivery Method: Circle System Utilized Preoxygenation: Pre-oxygenation with 100% oxygen Induction Type: Tracheostomy and Inhalational induction Laryngoscope Size: Glidescope and 4 Grade View: Grade I Tube type: Oral Tube size: 7.0 mm Number of attempts: 1 Airway Equipment and Method: Stylet and Oral airway Placement Confirmation: ETT inserted through vocal cords under direct vision,  positive ETCO2 and breath sounds checked- equal and bilateral Secured at: 23 cm Tube secured with: Tape Dental Injury: Teeth and Oropharynx as per pre-operative assessment

## 2020-08-24 NOTE — Progress Notes (Addendum)
Dr Suszanne Conners paged re: persistent bleeding from tracheostomy site. Plan to take pt to OR this morning to explore the site. In preparation for OR, pt's blood was returned to pt from CRRT filter, and both access and return lines each Hep locked with 1.3cc Heparin. OR staff transported pt to OR at 1015 am on 28% FiO2 trach collar. Dr Suszanne Conners talked with family.

## 2020-08-24 NOTE — Interval H&P Note (Signed)
History and Physical Interval Note:  08/19/2020 9:53 AM  Xachary B Berg  has presented today for surgery, with the diagnosis of bleeding from trach stoma.  The decision is made to proceed with  Procedure(s): EXPLORE TRACHEOSTOMY STOMA (N/A) as a surgical intervention.  The patient's history has been reviewed, patient examined, no change in status, stable for surgery.  I have reviewed the patient's chart and labs.  Questions were answered to the patient's satisfaction.     Tamarah Bhullar Amado Nash

## 2020-08-24 NOTE — Progress Notes (Signed)
New dialysis circuit and machine started at change of shift. Continuing to have low return alarms and high filter pressure alarms after restarting. Switched the ports and began having access extremely negative alarms. Was able to flush both ports get it running again. Tyler [name] continue to monitor for circuit clotting.   Notified nephrology and CCM.

## 2020-08-24 NOTE — Progress Notes (Signed)
[facility] NEPHROLOGY PROGRESS NOTE  Assessment/ Plan:  # Oligo-anuric AKI- presumably ischemic ATN in setting of PEA arrest requiring multiple rounds of CPR and pressors as well as contrast induced nephropathy following CT angio. Now likely anuric. Started on CRRT 08/14/20. Left IJ temp catheter placed 08/14/20 by PCCM-  Then replaced due to malfunction on 12/24. 2k bath. Low UF. -Try to increase BFR as tolerated to help with clotting -Monitor access pressures -Could consider citrate   No meaningful mental recovery with CRRT. Suspected anoxic brain injury and poor prognosis.  CRRT is not helping to recover his mental status.  Not a candidate for outpatient dialysis.  I recommend comfort measures/hospice care.  Seen by palliative care who is helping discuss [place] w/ family.   # Acute Hypoxic respiratory failure s/p emergent cric and s/p trach on vent per PCCM. Likely ARDS. O2 requirement largely unchanged  # Acute metabolic encephalopathy- due to severe hypoxic-ischemic encephalopathy per Neuro. No meaningful improvement even after trial of CRRT.  Poor prognosis.    # Aspiration pneumonia with sepsis: treatment per pccm. Febrile yesterday.  # Atrial fibrillation with RVR- currently rate controlled.  # Tracheal Bleeding: being evaluated by surgery today  Subjective: Negative venous pressures intermittently had to lower BFR and frequent clotting. Not using heparin due to intracranial hemorrhage. Ongoing [place] conversations. Worsening tracheal bleeding.  Objective Vital signs in last 24 hours: Vitals:   08/23/2020 0800 07/27/2020 0803 08/06/2020 0810 08/12/2020 0900  BP: (!) 157/86   (!) 144/84  Pulse: 99 96  97  Resp: (!) 25 (!) 28  (!) 28  Temp: 99.2 F (37.3 C)  98.6 F (37 C)   TempSrc: Oral  Oral   SpO2: 99% 99%  99%  Weight:      Height:       Weight change:   Intake/Output Summary (Last 24 hours) at 08/22/2020 0947 Last data filed at 08/12/2020 0900 Gross per  24 hour  Intake 1536 ml  Output 2081 ml  Net -545 ml       Labs: Basic Metabolic Panel: Recent Labs  Lab 08/23/20 0357 08/23/20 1600 08/14/2020 0142  NA 132* 134* 133*  K 4.2 4.1 5.1  CL 95* 97* 95*  CO2 _0 GLUCOSE 155* 156* 151*  BUN 66* 66* 69*  CREATININE 3.39* 3.61* 3.81*  CALCIUM 9.4 9.6 9.5  PHOS 6.6* 7.0* 7.6*   Liver Function Tests: Recent Labs  Lab 08/23/20 0357 08/23/20 1600 08/13/2020 0142  ALBUMIN 3.3* 3.4* 3.5   No results for input(s): LIPASE, AMYLASE in the last 168 hours. No results for input(s): AMMONIA in the last 168 hours. CBC: Recent Labs  Lab 08/18/20 0355 08/18/20 0415 08/19/20 0429 08/20/20 0413 08/22/20 0336 08/23/2020 0421  WBC 19.3*  --  19.3* 17.8* 23.4* 20.0*  NEUTROABS  --   --   --   --   --  14.4*  HGB 11.0*   < > 11.6* 11.6* 13.0 13.0  HCT 33.8*   < > 35.3* 34.0* 38.3* 39.0  MCV 102.7*  --  100.3* 98.3 98.2 97.5  PLT 231  --  320 366 470* 393   < > = values in this interval not displayed.   Cardiac Enzymes: No results for input(s): CKTOTAL, CKMB, CKMBINDEX, TROPONINI in the last 168 hours. CBG: Recent Labs  Lab 08/23/20 1107 08/23/20 1549 08/23/20 2017 08/17/2020 0000 08/02/2020 0339  GLUCAP 214* 158* 193* 165* 176*    Iron Studies: No results  for input(s): IRON, TIBC, TRANSFERRIN, FERRITIN in the last 72 hours. Studies/Results: DG CHEST PORT 1 VIEW  Result Date: 08/03/2020 CLINICAL DATA:  Central line placement.  Tracheostomy tube. EXAM: PORTABLE CHEST 1 VIEW COMPARISON:  Chest x-rays dated 08/23/2020 and 08/20/2020. FINDINGS: Stable cardiomegaly. Enteric tube passes below the diaphragm. Tracheostomy tube appears appropriately positioned in the midline. RIGHT IJ central catheter is stable in position with tip at the level of the upper SVC. Mild central pulmonary vascular congestion and associated interstitial edema. No confluent opacity to suggest a pneumonia. No pleural effusion or pneumothorax is seen. IMPRESSION:  1. Tracheostomy tube appears appropriately positioned in the midline. 2. RIGHT IJ central catheter stable in position with tip at the level of the upper SVC. 3. Enteric tube passes below the diaphragm. 4. Stable cardiomegaly. Mild central pulmonary vascular congestion and interstitial edema suggesting mild CHF/volume overload. Electronically Signed   By: Franki Cabot M.D.   On: 08/14/2020 08:57   DG CHEST PORT 1 VIEW  Result Date: 08/23/2020 CLINICAL DATA:  ARDS with respiratory failure EXAM: PORTABLE CHEST 1 VIEW COMPARISON:  August 20, 2020 FINDINGS: Tracheostomy catheter tip is 5.9 cm above the carina. Nasogastric tube tip and side port are below the diaphragm. Central catheter tip is in the superior vena cava. No pneumothorax. There is no edema or airspace opacity. Heart is mildly enlarged with pulmonary vascularity within normal limits. No adenopathy. No bone lesions. IMPRESSION: Tube and catheter positions as described without pneumothorax. No edema or airspace opacity. Stable cardiac prominence. Electronically Signed   By: Lowella Grip III M.D.   On: 08/23/2020 07:54    Medications: Infusions: . sodium chloride Stopped (08/23/20 1104)  . feeding supplement (VITAL 1.5 CAL) 1,000 mL (08/02/2020 0732)  . heparin 10,000 units/ 20 mL infusion syringe 1,400 Units/hr (08/23/2020 0800)  . prismasol BGK 2/2.5 dialysis solution 1,500 mL/hr at 08/02/2020 0827  . prismasol BGK 2/2.5 replacement solution 500 mL/hr at 08/16/2020 0349  . prismasol BGK 2/2.5 replacement solution 300 mL/hr at 08/02/2020 0348    Scheduled Medications: . alteplase  2 mg Intracatheter Once  . aspirin  81 mg Per Tube Daily  . B-complex with vitamin C  1 tablet Oral Daily  . chlorhexidine gluconate (MEDLINE KIT)  15 mL Mouth Rinse BID  . Chlorhexidine Gluconate Cloth  6 each Topical Daily  . feeding supplement (PROSource TF)  90 mL Per Tube TID  . fiber  1 packet Per Tube BID  . folic acid  1 mg Per Tube Daily  . heparin  injection (subcutaneous)  5,000 Units Subcutaneous Q8H  . insulin aspart  0-9 Units Subcutaneous Q4H  . mouth rinse  15 mL Mouth Rinse 10 times per day  . metoCLOPramide (REGLAN) injection  10 mg Intravenous Q6H  . sodium chloride flush  10-40 mL Intracatheter Q12H  . thiamine  100 mg Per Tube Daily    have reviewed scheduled and prn medications.  Physical Exam: General: On vent via trach, sedated and unresponsive, unchanged Heart:RRR, s1s2 nl Lungs: Coarse breath sound bilateral Abdomen:soft, Non-tender, non-distended Extremities: Trace edema Dialysis Access: IJ cath placed on 12/21.  Tyler [name] Tyler [name] 07/30/2020,9:47 AM  LOS: 15 days

## 2020-08-24 NOTE — Transfer of Care (Signed)
Immediate Anesthesia Transfer of Care Note  Patient: Tyler [name]  Procedure(s) Performed: EXPLORE TRACHEOSTOMY STOMA (N/A Neck)  Patient Location: ICU  Anesthesia Type:General  Level of Consciousness: unresponsive  Airway & Oxygen Therapy: Patient Spontanous Breathing and Patient connected to tracheostomy mask oxygen  Post-op Assessment: Report given to RN and Post -op Vital signs reviewed and stable  Post vital signs: Reviewed and stable  Last Vitals:  Vitals Value Taken Time  BP    Temp    Pulse    Resp    SpO2      Last Pain:  Vitals:   08/23/2020 0810  TempSrc: Oral  PainSc:       Patients Stated Pain Goal: 0 (08/16/20 0500)  Complications: No complications documented.

## 2020-08-24 NOTE — Progress Notes (Signed)
eLink Physician-Brief Progress Note Patient Name: Tyler [name] DOB: 03/13/1971 MRN: [id number]   Date of Service  08/21/2020  HPI/Events of Note  Patient with bleeding around the tracheostomy site with some blood trickling into the airway and causing coughing spells.  eICU Interventions  Hold SQ Heparin for DVT prophylaxis, stat CBC, PT-INR, PTT, Thrombin pads to be applied locally. Notify ENT in a.m. for possible laryngoscopy to further define the nature of the bleeding, and hopefully Rx it.        Thomasene Lot Georgianna Band 08/03/2020, 4:06 AM

## 2020-08-25 DIAGNOSIS — R4589 Other symptoms and signs involving emotional state: Secondary | ICD-10-CM

## 2020-08-25 DIAGNOSIS — Z9189 Other specified personal risk factors, not elsewhere classified: Secondary | ICD-10-CM

## 2020-08-25 LAB — GLUCOSE, CAPILLARY
Glucose-Capillary: 162 mg/dL — ABNORMAL HIGH (ref 70–99)
Glucose-Capillary: 155 mg/dL — ABNORMAL HIGH (ref 70–99)
Glucose-Capillary: 156 mg/dL — ABNORMAL HIGH (ref 70–99)
Glucose-Capillary: 221 mg/dL — ABNORMAL HIGH (ref 70–99)
Glucose-Capillary: 174 mg/dL — ABNORMAL HIGH (ref 70–99)
Glucose-Capillary: 112 mg/dL — ABNORMAL HIGH (ref 70–99)

## 2020-08-25 LAB — POCT ACTIVATED CLOTTING TIME
Activated Clotting Time: 202 seconds
Activated Clotting Time: 202 seconds
Activated Clotting Time: 208 seconds
Activated Clotting Time: 208 seconds
Activated Clotting Time: 214 seconds
Activated Clotting Time: 214 seconds
Activated Clotting Time: 214 seconds
Activated Clotting Time: 231 seconds
Activated Clotting Time: 237 seconds
Activated Clotting Time: 237 seconds

## 2020-08-25 LAB — RENAL FUNCTION PANEL
Albumin: 3.3 g/dL — ABNORMAL LOW (ref 3.5–5.0)
Albumin: 3.4 g/dL — ABNORMAL LOW (ref 3.5–5.0)
Anion gap: 20 — ABNORMAL HIGH (ref 5–15)
Anion gap: 15 (ref 5–15)
BUN: 83 mg/dL — ABNORMAL HIGH (ref 6–20)
BUN: 76 mg/dL — ABNORMAL HIGH (ref 6–20)
CO2: 15 mmol/L — ABNORMAL LOW (ref 22–32)
CO2: 25 mmol/L (ref 22–32)
Calcium: 9 mg/dL (ref 8.9–10.3)
Calcium: 9.4 mg/dL (ref 8.9–10.3)
Chloride: 99 mmol/L (ref 98–111)
Chloride: 94 mmol/L — ABNORMAL LOW (ref 98–111)
Creatinine, Ser: 4.29 mg/dL — ABNORMAL HIGH (ref 0.61–1.24)
Creatinine, Ser: 4.01 mg/dL — ABNORMAL HIGH (ref 0.61–1.24)
GFR, Estimated: 16 mL/min — ABNORMAL LOW (ref >60)
GFR, Estimated: 17 mL/min — ABNORMAL LOW (ref >60)
Glucose, Bld: 136 mg/dL — ABNORMAL HIGH (ref 70–99)
Glucose, Bld: 240 mg/dL — ABNORMAL HIGH (ref 70–99)
Phosphorus: 8.4 mg/dL — ABNORMAL HIGH (ref 2.5–4.6)
Phosphorus: 8 mg/dL — ABNORMAL HIGH (ref 2.5–4.6)
Potassium: 6.1 mmol/L — ABNORMAL HIGH (ref 3.5–5.1)
Potassium: 4.3 mmol/L (ref 3.5–5.1)
Sodium: 134 mmol/L — ABNORMAL LOW (ref 135–145)
Sodium: 134 mmol/L — ABNORMAL LOW (ref 135–145)

## 2020-08-25 LAB — POCT I-STAT EG7
Acid-Base Excess: 2 mmol/L (ref 0.0–2.0)
Bicarbonate: 26.9 mmol/L (ref 20.0–28.0)
Calcium, Ion: 1.15 mmol/L (ref 1.15–1.40)
HCT: 37 % — ABNORMAL LOW (ref 39.0–52.0)
Hemoglobin: 12.6 g/dL — ABNORMAL LOW (ref 13.0–17.0)
O2 Saturation: 74 %
Patient temperature: 99.5
Potassium: 4.4 mmol/L (ref 3.5–5.1)
Sodium: 133 mmol/L — ABNORMAL LOW (ref 135–145)
TCO2: 28 mmol/L (ref 22–32)
pCO2, Ven: 42 mmHg — ABNORMAL LOW (ref 44.0–60.0)
pH, Ven: 7.417 (ref 7.250–7.430)
pO2, Ven: 40 mmHg (ref 32.0–45.0)

## 2020-08-25 LAB — APTT: aPTT: 49 seconds — ABNORMAL HIGH (ref 24–36)

## 2020-08-25 LAB — MAGNESIUM: Magnesium: 3.2 mg/dL — ABNORMAL HIGH (ref 1.7–2.4)

## 2020-08-25 MED ORDER — SODIUM ZIRCONIUM CYCLOSILICATE 10 G PO PACK
20.0000 g | PACK | Freq: Once | ORAL | Status: AC
  Administered 2020-08-25: 20 g via ORAL
  Filled 2020-08-25: qty 2

## 2020-08-25 NOTE — Progress Notes (Signed)
At about 1400 patient heart rate began to slowly increase to 120s. Since then HR has slowly been trending down over the past few hours. HR as low as mid 50s. EKG performed. Assessment otherwise unchanged, other VS stable. Dr. Denese Killings notified.

## 2020-08-25 NOTE — Progress Notes (Addendum)
eLink Physician-Brief Progress Note Patient Name: Tyler [name] DOB: 06-Aug-1971 MRN: [id number]   Date of Service  08/25/2020  HPI/Events of Note  Notified that patient vomited and then had secretions via his trach, mouth and nose come out. He was on Trach collar at the time, O2 needs increased due to desaturation and he was placed back on the vent and we were called. At 60% fio2 and peep of 8, His o2 sat is 98 when I saw him on camera. No distress. RN had already held the tube feeds (cortrack).   eICU Interventions  No belly tenderness elicited but does look distended, has a flexiseal with liquid stool Continue vent support from now, clarified there are no cuff related issues with the trach Hold feeds Decompress stomach with NG Obtain CXR and KUB Asked to let us know when these are done, [name] decide about feeds based on the results     Intervention Category Major Interventions: Respiratory failure - evaluation and management  Oretha Milch 08/25/2020, 11:59 PM   12:50 am  Reviewed X rays, RN had already advanced the NG tube in  [name] leave NPO, gentle hydration added  O2 sat is 99 , wean fio2 May switch to trach collar again in AM if continues to improve  D/w RN   1:25 am Hypotension, initially repeat BP was ok before we were called but now systolic in 70s  Stop pulling fluid via CRRT Has high NG output quickly Albumin x 1 Start levophed Also patient is febrile now Blood cultures Antibiotics ordered Check cbc, lactate also stat, with abg  RN also notified me that patient is more unresponsive than before, unable to elicit response to painful stimuli which could correlate with low Bps in a man with poor mental status in last several days - [name] work to improve his BP and hemodynamics first Calder update family as well, his condition has surely worsened  5:30 am At 530 am I was asked about holding AM dose of heparin Events noted, bedside PCCM had obtained ct head and neuro also  on board Devastating hemorrhage, [name] DC heparin and ASA and all blood thinners to be avoided

## 2020-08-25 NOTE — Procedures (Signed)
Seen and evaluated on CRRT. Low BFR high cartridge pressures. Intermittent high negative pressures. Not using anticoagulation given bleed. CTM
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
## 2020-08-25 NOTE — Procedures (Signed)
Seen and evaluated on CRRT. Low BFR high cartridge pressures. Intermittent high negative pressures ongoing. Possible kinked catheter resolved. Family meeting today. Consider citrate

## 2020-08-25 NOTE — Progress Notes (Addendum)
[facility] NEPHROLOGY PROGRESS NOTE  Assessment/ Plan:  # Oligo-anuric AKI- presumably ischemic ATN in setting of PEA arrest requiring multiple rounds of CPR and pressors as well as contrast induced nephropathy following CT angio. Now anuric. Started on CRRT 08/14/20. Left IJ temp catheter placed 08/14/20 by PCCM-  Then replaced due to malfunction on 12/24. 2k bath. -Try to increase BFR as tolerated to help with clotting; limited by adverse access pressures -If crrt is continued may have to try citrate -Monitor access pressures -Lokelma for hyperkalemia (6.1 but some hemolysis) and cont crrt   No meaningful mental recovery with CRRT. Suspected anoxic brain injury and poor prognosis; more details per neuro.  CRRT is not helping to recover his mental status.  Not a candidate for outpatient dialysis if his mental status does not recover. Unclear baseline Crt but given anuria he is unlikely to recover kidney function even if he did this would not improve his neurological status. I recommend comfort measures/hospice care.  Seen by palliative care who is helping discuss [place] w/ family. Family meeting today.  # Acute Hypoxic respiratory failure s/p emergent cric and s/p trach on vent per PCCM. Likely ARDS. 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 No meaningful improvement even after trial of CRRT.  Poor prognosis.    # Aspiration pneumonia with sepsis: treatment per pccm.   # Atrial fibrillation with RVR- currently rate controlled.  # Tracheal Bleeding: improved after OR yesterday. Thyroid lobe oozing.  ADDENDUM 10:16 AM: Family meeting with daughter and significant other.  Discussed poor prognosis with unlikely neurological recovery.  Not an outpatient dialysis candidate unless he did have neurological recovery.  Not a transplant candidate.  Not receptive to comfort care at  this time.  Plans for long-term care facility, attempted transition to hemodialysis, and permanent feeding tube placement.  Likely plan for CRRT no restart tomorrow and transition to hemodialysis on Monday  Subjective: Ongoing CRRT issues with clotting and high cartridge pressures. Possible kink in catheter now fixed per nursing. Remains non-interactive. Family meeting today.  Objective Vital signs in last 24 hours: Vitals:   08/25/20 0700 08/25/20 0726 08/25/20 0730 08/25/20 0741  BP: 106/79  103/80 103/80  Pulse: 93  91 93  Resp: 20  (!) 23 (!) 28  Temp:  98.3 F (36.8 C)    TempSrc:  Oral    SpO2: 96%  97% 98%  Weight:      Height:       Weight change:   Intake/Output Summary (Last 24 hours) at 08/25/2020 0801 Last data filed at 08/25/2020 0700 Gross per 24 hour  Intake 1850 ml  Output 1937 ml  Net -87 ml       Labs: Basic Metabolic Panel: Recent Labs  Lab 08/21/2020 0142 07/27/2020 1649 08/25/20 0112  NA 133* 132* 134*  K 5.1 4.8 6.1*  CL 95* 96* 99  CO2 23 19* 15*  GLUCOSE 151* 189* 136*  BUN 69* 77* 83*  CREATININE 3.81* 4.08* 4.29*  CALCIUM 9.5 9.3 9.0  PHOS 7.6* 9.0* 8.4*   Liver Function Tests: Recent Labs  Lab 08/01/2020 0142 08/05/2020 1649 08/25/20 0112  ALBUMIN 3.5 3.3* 3.3*   No results for input(s): LIPASE, AMYLASE in the last 168 hours. No results for input(s): AMMONIA in the last 168 hours. CBC: Recent Labs  Lab 08/19/20 0429 08/20/20 0413 08/22/20 0336 08/14/2020 0421  WBC 19.3* 17.8* 23.4* 20.0*  NEUTROABS  --   --   --  14.4*  HGB 11.6* 11.6* 13.0 13.0  HCT 35.3* 34.0* 38.3* 39.0  MCV 100.3* 98.3 98.2 97.5  PLT 320 366 470* 393   Cardiac Enzymes: No results for input(s): CKTOTAL, CKMB, CKMBINDEX, TROPONINI in the last 168 hours. CBG: Recent Labs  Lab 08/15/2020 1517 07/31/2020 2017 07/26/2020 2341 08/25/20 0350 08/25/20 0724  GLUCAP 158* 197* 167* 162* 155*    Iron Studies: No results for input(s): IRON, TIBC, TRANSFERRIN, FERRITIN  in the last 72 hours. Studies/Results: DG CHEST PORT 1 VIEW  Result Date: 08/05/2020 CLINICAL DATA:  Central line placement.  Tracheostomy tube. EXAM: PORTABLE CHEST 1 VIEW COMPARISON:  Chest x-rays dated 08/23/2020 and 08/20/2020. FINDINGS: Stable cardiomegaly. Enteric tube passes below the diaphragm. Tracheostomy tube appears appropriately positioned in the midline. RIGHT IJ central catheter is stable in position with tip at the level of the upper SVC. Mild central pulmonary vascular congestion and associated interstitial edema. No confluent opacity to suggest a pneumonia. No pleural effusion or pneumothorax is seen. IMPRESSION: 1. Tracheostomy tube appears appropriately positioned in the midline. 2. RIGHT IJ central catheter stable in position with tip at the level of the upper SVC. 3. Enteric tube passes below the diaphragm. 4. Stable cardiomegaly. Mild central pulmonary vascular congestion and interstitial edema suggesting mild CHF/volume overload. Electronically Signed   By: Franki Cabot M.D.   On: 07/31/2020 08:57    Medications: Infusions: . sodium chloride Stopped (08/23/20 1104)  . feeding supplement (VITAL 1.5 CAL) 1,000 mL (08/25/20 0600)  . heparin 10,000 units/ 20 mL infusion syringe 1,750 Units/hr (08/25/20 0635)  . prismasol BGK 2/2.5 dialysis solution 1,500 mL/hr at 08/25/20 0620  . prismasol BGK 2/2.5 replacement solution 500 mL/hr at 08/25/20 0604  . prismasol BGK 2/2.5 replacement solution 300 mL/hr at 08/08/2020 2009    Scheduled Medications: . alteplase  2 mg Intracatheter Once  . aspirin  81 mg Per Tube Daily  . B-complex with vitamin C  1 tablet Oral Daily  . chlorhexidine gluconate (MEDLINE KIT)  15 mL Mouth Rinse BID  . Chlorhexidine Gluconate Cloth  6 each Topical Daily  . feeding supplement (PROSource TF)  90 mL Per Tube TID  . fiber  1 packet Per Tube BID  . folic acid  1 mg Per Tube Daily  . heparin injection (subcutaneous)  5,000 Units Subcutaneous Q8H  .  insulin aspart  0-9 Units Subcutaneous Q4H  . mouth rinse  15 mL Mouth Rinse 10 times per day  . sodium chloride flush  10-40 mL Intracatheter Q12H  . thiamine  100 mg Per Tube Daily    have reviewed scheduled and prn medications.  Physical Exam: General: On vent via trach, sedated and unresponsive, unchanged Heart:normal rate no audible murmurs Lungs: Coarse breath sound bilateral Abdomen:soft, Non-tender, non-distended Extremities: Trace edema Neuro: corneal reflex intact, not interactive Dialysis Access: IJ cath placed on 12/21.  Shaune Pollack Narjis Mira 08/25/2020,8:01 AM  LOS: 16 days

## 2020-08-25 NOTE — Plan of Care (Signed)
  Problem: Safety: Goal: Ability to remain free from injury Kevontay improve Outcome: Progressing   Problem: Respiratory: Goal: Ability to maintain a clear airway and adequate ventilation Erle improve Outcome: Progressing   Problem: Skin Integrity: Goal: Risk for impaired skin integrity Eshan be minimized. Outcome: Progressing   Problem: Education: Goal: Knowledge of General Education information Jeramiah improve Description: Including pain rating scale, medication(s)/side effects and non-pharmacologic comfort measures Outcome: Not Progressing
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
## 2020-08-25 DEATH — deceased

## 2020-08-26 ENCOUNTER — Inpatient Hospital Stay (HOSPITAL_COMMUNITY): Payer: Self-pay

## 2020-08-26 DIAGNOSIS — G9382 Brain death: Secondary | ICD-10-CM

## 2020-08-26 DIAGNOSIS — I611 Nontraumatic intracerebral hemorrhage in hemisphere, cortical: Secondary | ICD-10-CM

## 2020-08-26 DIAGNOSIS — I609 Nontraumatic subarachnoid hemorrhage, unspecified: Secondary | ICD-10-CM

## 2020-08-26 DIAGNOSIS — Z66 Do not resuscitate: Secondary | ICD-10-CM

## 2020-08-26 DIAGNOSIS — G935 Compression of brain: Secondary | ICD-10-CM

## 2020-08-26 DIAGNOSIS — I615 Nontraumatic intracerebral hemorrhage, intraventricular: Secondary | ICD-10-CM

## 2020-08-26 LAB — POCT I-STAT 7, (LYTES, BLD GAS, ICA,H+H)
Acid-Base Excess: 1 mmol/L (ref 0.0–2.0)
Acid-Base Excess: 0 mmol/L (ref 0.0–2.0)
Acid-base deficit: 3 mmol/L — ABNORMAL HIGH (ref 0.0–2.0)
Bicarbonate: 25.8 mmol/L (ref 20.0–28.0)
Bicarbonate: 25.4 mmol/L (ref 20.0–28.0)
Bicarbonate: 28.1 mmol/L — ABNORMAL HIGH (ref 20.0–28.0)
Calcium, Ion: 1.1 mmol/L — ABNORMAL LOW (ref 1.15–1.40)
Calcium, Ion: 1.12 mmol/L — ABNORMAL LOW (ref 1.15–1.40)
Calcium, Ion: 1.21 mmol/L (ref 1.15–1.40)
HCT: 35 % — ABNORMAL LOW (ref 39.0–52.0)
HCT: 30 % — ABNORMAL LOW (ref 39.0–52.0)
HCT: 32 % — ABNORMAL LOW (ref 39.0–52.0)
Hemoglobin: 11.9 g/dL — ABNORMAL LOW (ref 13.0–17.0)
Hemoglobin: 10.2 g/dL — ABNORMAL LOW (ref 13.0–17.0)
Hemoglobin: 10.9 g/dL — ABNORMAL LOW (ref 13.0–17.0)
O2 Saturation: 100 %
O2 Saturation: 100 %
O2 Saturation: 99 %
Patient temperature: 100.5
Patient temperature: 101.5
Patient temperature: 101.5
Potassium: 4.5 mmol/L (ref 3.5–5.1)
Potassium: 4.4 mmol/L (ref 3.5–5.1)
Potassium: 4.5 mmol/L (ref 3.5–5.1)
Sodium: 133 mmol/L — ABNORMAL LOW (ref 135–145)
Sodium: 133 mmol/L — ABNORMAL LOW (ref 135–145)
Sodium: 133 mmol/L — ABNORMAL LOW (ref 135–145)
TCO2: 27 mmol/L (ref 22–32)
TCO2: 27 mmol/L (ref 22–32)
TCO2: 31 mmol/L (ref 22–32)
pCO2 arterial: 41.5 mmHg (ref 32.0–48.0)
pCO2 arterial: 47.9 mmHg (ref 32.0–48.0)
pCO2 arterial: 91.7 mmHg (ref 32.0–48.0)
pH, Arterial: 7.406 (ref 7.350–7.450)
pH, Arterial: 7.105 — CL (ref 7.350–7.450)
pH, Arterial: 7.34 — ABNORMAL LOW (ref 7.350–7.450)
pO2, Arterial: 345 mmHg — ABNORMAL HIGH (ref 83.0–108.0)
pO2, Arterial: 191 mmHg — ABNORMAL HIGH (ref 83.0–108.0)
pO2, Arterial: 390 mmHg — ABNORMAL HIGH (ref 83.0–108.0)

## 2020-08-26 LAB — BASIC METABOLIC PANEL
Anion gap: 18 — ABNORMAL HIGH (ref 5–15)
BUN: 88 mg/dL — ABNORMAL HIGH (ref 6–20)
CO2: 22 mmol/L (ref 22–32)
Calcium: 8.8 mg/dL — ABNORMAL LOW (ref 8.9–10.3)
Chloride: 95 mmol/L — ABNORMAL LOW (ref 98–111)
Creatinine, Ser: 5.31 mg/dL — ABNORMAL HIGH (ref 0.61–1.24)
GFR, Estimated: 12 mL/min — ABNORMAL LOW (ref >60)
Glucose, Bld: 178 mg/dL — ABNORMAL HIGH (ref 70–99)
Potassium: 4.5 mmol/L (ref 3.5–5.1)
Sodium: 135 mmol/L (ref 135–145)

## 2020-08-26 LAB — CBC WITH DIFFERENTIAL/PLATELET
Abs Immature Granulocytes: 0 10*3/uL (ref 0.00–0.07)
Basophils Absolute: 0.5 10*3/uL — ABNORMAL HIGH (ref 0.0–0.1)
Basophils Relative: 2 %
Eosinophils Absolute: 0.5 10*3/uL (ref 0.0–0.5)
Eosinophils Relative: 2 %
HCT: 31.3 % — ABNORMAL LOW (ref 39.0–52.0)
Hemoglobin: 10.4 g/dL — ABNORMAL LOW (ref 13.0–17.0)
Lymphocytes Relative: 15 %
Lymphs Abs: 3.7 10*3/uL (ref 0.7–4.0)
MCH: 32.6 pg (ref 26.0–34.0)
MCHC: 33.2 g/dL (ref 30.0–36.0)
MCV: 98.1 fL (ref 80.0–100.0)
Monocytes Absolute: 3 10*3/uL — ABNORMAL HIGH (ref 0.1–1.0)
Monocytes Relative: 12 %
Neutro Abs: 17.1 10*3/uL — ABNORMAL HIGH (ref 1.7–7.7)
Neutrophils Relative %: 69 %
Platelets: 197 10*3/uL (ref 150–400)
RBC: 3.19 MIL/uL — ABNORMAL LOW (ref 4.22–5.81)
RDW: 12.4 % (ref 11.5–15.5)
WBC: 24.8 10*3/uL — ABNORMAL HIGH (ref 4.0–10.5)
nRBC: 0 % (ref 0.0–0.2)

## 2020-08-26 LAB — RENAL FUNCTION PANEL
Albumin: 3.1 g/dL — ABNORMAL LOW (ref 3.5–5.0)
Anion gap: 17 — ABNORMAL HIGH (ref 5–15)
BUN: 88 mg/dL — ABNORMAL HIGH (ref 6–20)
CO2: 23 mmol/L (ref 22–32)
Calcium: 8.8 mg/dL — ABNORMAL LOW (ref 8.9–10.3)
Chloride: 95 mmol/L — ABNORMAL LOW (ref 98–111)
Creatinine, Ser: 5.19 mg/dL — ABNORMAL HIGH (ref 0.61–1.24)
GFR, Estimated: 13 mL/min — ABNORMAL LOW (ref >60)
Glucose, Bld: 202 mg/dL — ABNORMAL HIGH (ref 70–99)
Phosphorus: 9.1 mg/dL — ABNORMAL HIGH (ref 2.5–4.6)
Potassium: 4.4 mmol/L (ref 3.5–5.1)
Sodium: 135 mmol/L (ref 135–145)

## 2020-08-26 LAB — APTT: aPTT: 35 seconds (ref 24–36)

## 2020-08-26 LAB — MAGNESIUM: Magnesium: 2.9 mg/dL — ABNORMAL HIGH (ref 1.7–2.4)

## 2020-08-26 LAB — GLUCOSE, CAPILLARY
Glucose-Capillary: 191 mg/dL — ABNORMAL HIGH (ref 70–99)
Glucose-Capillary: 143 mg/dL — ABNORMAL HIGH (ref 70–99)

## 2020-08-26 LAB — POCT ACTIVATED CLOTTING TIME: Activated Clotting Time: 196 seconds

## 2020-08-26 LAB — LACTIC ACID, PLASMA: Lactic Acid, Venous: 1.2 mmol/L (ref 0.5–1.9)

## 2020-08-26 MED ORDER — VANCOMYCIN HCL IN DEXTROSE 1-5 GM/200ML-% IV SOLN: 1000.0000 mg | INTRAVENOUS | Status: DC

## 2020-08-26 MED ORDER — VANCOMYCIN HCL 2000 MG/400ML IV SOLN
2000.0000 mg | Freq: Once | INTRAVENOUS | Status: AC
  Administered 2020-08-26: 2000 mg via INTRAVENOUS
  Filled 2020-08-26: qty 400

## 2020-08-26 MED ORDER — ALBUMIN HUMAN 5 % IV SOLN
12.5000 g | Freq: Once | INTRAVENOUS | Status: AC
  Administered 2020-08-26: 12.5 g via INTRAVENOUS

## 2020-08-26 MED ORDER — SODIUM CHLORIDE 0.9 % IV SOLN
1.0000 g | Freq: Two times a day (BID) | INTRAVENOUS | Status: DC
  Administered 2020-08-26: 1 g via INTRAVENOUS
  Filled 2020-08-26 (×4): qty 1

## 2020-08-26 MED ORDER — SODIUM CHLORIDE 0.9 % IV SOLN: 2.0000 g | Freq: Two times a day (BID) | INTRAVENOUS | Status: DC

## 2020-08-26 MED ORDER — NOREPINEPHRINE 4 MG/250ML-% IV SOLN
0.0000 ug/min | INTRAVENOUS | Status: DC
  Administered 2020-08-26: 28 ug/min via INTRAVENOUS
  Administered 2020-08-26: 5.333 ug/min via INTRAVENOUS
  Filled 2020-08-26 (×3): qty 250

## 2020-08-26 MED ORDER — ALBUMIN HUMAN 5 % IV SOLN
INTRAVENOUS | Status: AC
  Filled 2020-08-26: qty 250

## 2020-08-26 MED ORDER — NOREPINEPHRINE 4 MG/250ML-% IV SOLN
INTRAVENOUS | Status: AC
  Filled 2020-08-26: qty 250

## 2020-08-26 MED ORDER — DEXTROSE-NACL 5-0.9 % IV SOLN: INTRAVENOUS | Status: DC

## 2020-08-27 ENCOUNTER — Encounter (HOSPITAL_COMMUNITY): Payer: Self-pay | Admitting: Otolaryngology

## 2020-09-01 LAB — CULTURE, BLOOD (ROUTINE X 2)
Culture: NO GROWTH
Culture: NO GROWTH
Special Requests: ADEQUATE

## 2020-09-25 NOTE — Significant Event (Signed)
Rapid Response Event Note   Reason for Call : Code Stroke LKW 23:30 08/26/19 (CCM initially notified, Levo and Albumin were only orders given, refer to change of neruo status note at 23:30 08/26/19)   Initial Focused Assessment:  Pt baseline: responding to pain in all extremities except RUE. Gag, cough, pupil, corneal reflects intact  Interventions: Stat Head CT   Plan of Care:  Family notified of "Large amount of subarachnoid hemorrhage extending into the ventricles.Large amount of subarachnoid hemorrhage extending into the Ventricles" results of head CT   Event Summary:  DNR code status change  MD Notified: Ground Team CCM, Neurologist on call Call Time: 1:45am Arrival Time: 02:00am End Time: 02:15am  Brett Canales, RN

## 2020-09-25 NOTE — Death Summary Note (Signed)
DEATH SUMMARY   Patient Details  Name: Tyler [name] MRN: [id number] DOB: [birth year]  Admission/Discharge Information   Admit Date:  2020/03/10  Date of Death: Date of Death: 09/08/2020  Time of Death: Time of Death: 0927  Length of Stay: 17  Referring Physician: Patient, No Pcp Per   Reason(s) for Hospitalization  Cardiac arrest  Diagnoses  Preliminary cause of death: intracranial hemorrhage.  Secondary Diagnoses (including complications and co-morbidities):  Active Problems:   Cardiac arrest (HCC)   AKI (acute kidney injury) (HCC)   Renal failure (ARF), acute on chronic (HCC) Anoxic encephalopathy ARDS  Brief Hospital Course (including significant findings, care, treatment, and services provided and events leading to death)  Tyler [name] is a 50 y.o. year old male who suffered a witnessed PEA associate with aspiration event. The exact cause of the arrest remained uncertain. It occurred during heavy lifting which might have been associated with a Valsalva maneuver resulting in arrhythmia.  The patient did develop aspiration pneumonitis and ARDS requiring prone ventilation. He was known to have esophageal stenosis and it is therefore possible that the arrest was primarily respiratory from sudden aspiration while straining to lift. CRRT was initiated for a acute kidney injury. Lungs eventually improved and sedation was eventually stopped but the patient did not improve neurologically beyond brainstem reflexes.  Palliative care was consulted to assist with goals of care discussions as the prognosis for recovery to a meaningful quality of life was poor, but the family initially refused to consider any limitations in scope of care. However, the patient experienced a sudden change in mental status with fixed pupils and a loss of all brainstem reflexes. CT head revealed a massive left intracerebral hemorrhage with mass effect and brainstem herniation. The patient was pronounced dead by  neurological criteria and life support was withdrawn.  Pertinent Labs and Studies  Significant Diagnostic Studies DG Chest 1 View  Result Date: 08/14/2020 CLINICAL DATA:  50 year old male status post intravenous line placement. EXAM: CHEST  1 VIEW COMPARISON:  Chest radiograph dated 08/13/2020. FINDINGS: Interval placement of a left IJ central venous line with tip over mid SVC at the level of the left innominate vein. Tracheostomy above the carina and feeding tube extends below the diaphragm with tip beyond the inferior margin of the image. Diffuse bilateral pulmonary opacities slightly worsened on the right compared to the prior radiograph. A small right pleural effusion is suspected. No pneumothorax. Stable cardiomegaly. No acute osseous pathology. IMPRESSION: Interval placement of a left IJ central venous line with tip over mid SVC at the level of the left innominate vein. No pneumothorax. Electronically Signed   By: Elgie CollardArash  Radparvar M.D.   On: 08/14/2020 15:58   DG Chest 1 View  Result Date: 08/12/2020 CLINICAL DATA:  Acute respiratory failure with hypoxia. Status post cardiac arrest. EXAM: CHEST  1 VIEW COMPARISON:  08/02/2020 FINDINGS: Tracheostomy tube and feeding tube remain in place. Stable mild cardiomegaly. Bilateral diffuse pulmonary airspace disease shows no significant change. No evidence of pleural effusion. IMPRESSION: Mild cardiomegaly and diffuse bilateral pulmonary airspace disease, without significant change. Electronically Signed   By: Danae OrleansJohn A Stahl M.D.   On: 08/12/2020 06:26   DG Abd 1 View  Result Date: 09/01/2020 CLINICAL DATA:  NG tube placement EXAM: ABDOMEN - 1 VIEW COMPARISON:  None. FINDINGS: Mildly prominent air-filled loops of bowel are seen within the mid abdomen. NG tube tip is seen within the distal body of the stomach. Second OG tube is seen likely  at the gastroduodenal junction. IMPRESSION: Two enteric tube is seen within the distal body of the stomach.  Electronically Signed   By: Jonna Clark M.D.   On: 09/17/2020 01:12   DG Abd 1 View  Result Date: 09/23/2020 CLINICAL DATA:  Nasogastric tube placement EXAM: ABDOMEN - 1 VIEW COMPARISON:  08/21/2020 FINDINGS: Nasogastric tube extends into the expected mid body of the stomach. Nasoenteric feeding tube extends to the expected gastric antrum in the region of the pylorus. Stable mild hepatomegaly. Normal abdominal gas pattern. IMPRESSION: Nasogastric tube in appropriate position. Nasoenteric feeding tube tip noted within the expected gastric antrum. Advancement of the catheter by 15-20 cm may better position the catheter within the distal duodenum/proximal jejunum. Electronically Signed   By: Helyn Numbers MD   On: 09/01/2020 00:27   DG Abd 1 View  Result Date: 08/21/2020 CLINICAL DATA:  Enteric catheter replacement EXAM: ABDOMEN - 1 VIEW COMPARISON:  08/12/2020 FINDINGS: Frontal view of the lower chest and abdomen are obtained, excluding the left flank by collimation. There are 2 enteric catheters. Proximal catheter tip and side port project over the gastric fundus. The larger distal catheter tip projects over the gastric antrum. Dilated gas-filled loops of small bowel are identified consistent with small-bowel obstruction or ileus, measuring up to 3.8 cm in diameter. Right femoral catheter tip projects over the expected region of the right common iliac vein. No acute bony abnormalities. IMPRESSION: 1. Enteric catheters as above. 2. Distended gas-filled loops of small bowel consistent with small bowel obstruction or ileus. Electronically Signed   By: Sharlet Salina M.D.   On: 08/21/2020 18:39   DG Abd 1 View  Result Date: 08/12/2020 CLINICAL DATA:  Nasogastric tube placement. EXAM: ABDOMEN - 1 VIEW COMPARISON:  12/16/2019 FINDINGS: A feeding tube is seen with tip overlying the distal gastric antrum or pylorus. No evidence of dilated bowel loops. IMPRESSION: Feeding tube tip overlies the distal gastric  antrum or pylorus. Electronically Signed   By: Danae Orleans M.D.   On: 08/12/2020 06:27   CT Head Wo Contrast  Result Date: 08/04/2020 CLINICAL DATA:  Cerebral hemorrhage suspected. Witnessed cardiac arrest. EXAM: CT HEAD WITHOUT CONTRAST TECHNIQUE: Contiguous axial images were obtained from the base of the skull through the vertex without intravenous contrast. COMPARISON:  CT head 04/22/2019 FINDINGS: Brain: No evidence of large-territorial acute infarction. No parenchymal hemorrhage. No mass lesion. No extra-axial collection. No mass effect or midline shift. No hydrocephalus. Basilar cisterns are patent. Vascular: No hyperdense vessel. Skull: No acute fracture or focal lesion. Sinuses/Orbits: Bilateral maxillary, ethmoid, sphenoid sinus mucosal thickening. Otherwise the paranasal sinuses and mastoid air cells are clear. The orbits are unremarkable. Other: Subcutaneus soft tissue edema tracking along the face. IMPRESSION: No acute intracranial abnormality. Electronically Signed   By: Tish Frederickson M.D.   On: 08/22/2020 23:29   CT ANGIO CHEST PE W OR WO CONTRAST  Result Date: 08/02/2020 CLINICAL DATA:  PE suspected, high prob Witnessed cardiac arrest. EXAM: CT ANGIOGRAPHY CHEST WITH CONTRAST TECHNIQUE: Multidetector CT imaging of the chest was performed using the standard protocol during bolus administration of intravenous contrast. Multiplanar CT image reconstructions and MIPs were obtained to evaluate the vascular anatomy. CONTRAST:  OMNIPAQUE IOHEXOL 350 MG/ML SOLN COMPARISON:  Chest radiograph earlier today. Chest CT 09/09/2018 FINDINGS: Cardiovascular: No pulmonary embolus to the segmental level. The subsegmental branches are not well assessed due to contrast bolus timing and likely diminished cardiac perfusion. The thoracic aorta is normal in caliber. Multi chamber  cardiomegaly. There is no pericardial effusion. There is contrast refluxing into the hepatic veins and IVC. Mediastinum/Nodes:  Extensive pneumomediastinum extending from the thoracic inlet into the upper abdomen. Tracheal tube is present with tip above the clavicles, entry site not included in the field of view. Motion artifact limits assessment for adenopathy, as well as bilateral perihilar consolidations. The included trachea and bronchi are patent. The previous enteric tube entering the right bronchus is been removed. No definite esophageal wall thickening. Lungs/Pleura: Dense symmetric consolidations in both lower lobes, with lesser ground-glass and consolidative opacities in the dependent upper lobes, right middle lobe and lingula. Central air bronchograms. No significant pleural fluid. Upper Abdomen: Pneumomediastinum tracks into the upper abdomen were there is free air anteriorly. Liver appears prominent in size. Significant contrast refluxing into the hepatic veins and IVC below the level of the renal veins. No other acute upper abdominal findings. Musculoskeletal: Subcutaneous emphysema in the neck. No acute sternal or anterior rib fracture is typically seen with CPR. Diffuse thoracic spondylosis. Review of the MIP images confirms the above findings. IMPRESSION: 1. No pulmonary embolus to the segmental level. The subsegmental branches are not well assessed due to contrast bolus timing and likely diminished cardiac perfusion. 2. Extensive pneumomediastinum extending from the thoracic inlet into the upper abdomen. Subcutaneous emphysema in the neck. 3. Dense symmetric consolidations in both lower lobes, with lesser ground-glass and consolidative opacities in the dependent upper lobes, right middle lobe, and lingula. Findings most consistent with aspiration. Superimposed pulmonary edema is also considered. 4. Multi chamber cardiomegaly with contrast refluxing into the hepatic veins and IVC consistent with elevated right heart pressures. Electronically Signed   By: Narda Rutherford M.D.   On: 08-Sep-2020 23:36   US RENAL  Result  Date: 08/10/2020 CLINICAL DATA:  Acute renal injury EXAM: RENAL / URINARY TRACT ULTRASOUND COMPLETE COMPARISON:  None. FINDINGS: Right Kidney: Renal measurements: 14.2 x 6.1 x 6.8 cm. = volume: 309 mL. Echogenicity within normal limits. No mass or hydronephrosis visualized. Left Kidney: Renal measurements: 14.8 x 7.1 x 7.4 cm. = volume: 409 mL. Echogenicity within normal limits. No mass or hydronephrosis visualized. Bladder: Decompressed Other: None. IMPRESSION: Normal appearing kidneys bilaterally. Electronically Signed   By: Alcide Clever M.D.   On: 08/10/2020 12:43   DG Chest Port 1 View  Result Date: 09/21/2020 CLINICAL DATA:  Hypoxia EXAM: PORTABLE CHEST 1 VIEW COMPARISON:  07/25/2020 FINDINGS: Tracheostomy, nasogastric tube extending into the upper abdomen, and right internal jugular hemodialysis catheter with its tip within the superior vena cava are unchanged. Pulmonary insufflation is slightly improved. Minimal right basilar interstitial infiltrate, likely residua of inflammatory consolidation noted on prior CT examination of 2020/09/08. No pneumothorax or pleural effusion. Cardiac size within normal limits. Pulmonary vascularity is normal. IMPRESSION: Right basilar interstitial infiltrate, likely the residua of consolidation noted on prior CT examination. Stable support lines and tubes. Electronically Signed   By: Helyn Numbers MD   On: 09/01/2020 00:25   DG CHEST PORT 1 VIEW  Result Date: 07/29/2020 CLINICAL DATA:  Central line placement.  Tracheostomy tube. EXAM: PORTABLE CHEST 1 VIEW COMPARISON:  Chest x-rays dated 08/23/2020 and 08/20/2020. FINDINGS: Stable cardiomegaly. Enteric tube passes below the diaphragm. Tracheostomy tube appears appropriately positioned in the midline. RIGHT IJ central catheter is stable in position with tip at the level of the upper SVC. Mild central pulmonary vascular congestion and associated interstitial edema. No confluent opacity to suggest a pneumonia. No  pleural effusion or pneumothorax is seen. IMPRESSION:  1. Tracheostomy tube appears appropriately positioned in the midline. 2. RIGHT IJ central catheter stable in position with tip at the level of the upper SVC. 3. Enteric tube passes below the diaphragm. 4. Stable cardiomegaly. Mild central pulmonary vascular congestion and interstitial edema suggesting mild CHF/volume overload. Electronically Signed   By: Bary Richard M.D.   On: 09-19-20 08:57   DG CHEST PORT 1 VIEW  Result Date: 08/23/2020 CLINICAL DATA:  ARDS with respiratory failure EXAM: PORTABLE CHEST 1 VIEW COMPARISON:  August 20, 2020 FINDINGS: Tracheostomy catheter tip is 5.9 cm above the carina. Nasogastric tube tip and side port are below the diaphragm. Central catheter tip is in the superior vena cava. No pneumothorax. There is no edema or airspace opacity. Heart is mildly enlarged with pulmonary vascularity within normal limits. No adenopathy. No bone lesions. IMPRESSION: Tube and catheter positions as described without pneumothorax. No edema or airspace opacity. Stable cardiac prominence. Electronically Signed   By: Bretta Bang III M.D.   On: 08/23/2020 07:54   DG Chest Port 1 View  Result Date: 08/20/2020 CLINICAL DATA:  Acute respiratory failure. EXAM: PORTABLE CHEST 1 VIEW COMPARISON:  August 19, 2020. FINDINGS: Similar positioning of a tracheostomy tube which projects midline over the trachea. Enteric tube courses below the diaphragm outside the field of view. Similar position of a right IJ central venous catheter with the tip projecting at the SVC. Similar low lung volumes with mild enlargement the cardiac silhouette and pulmonary vascular congestion. Similar streaky bibasilar opacities. IMPRESSION: 1. Similar low lung volumes, mild cardiomegaly, and pulmonary vascular congestion without overt pulmonary edema. 2. Similar streaky bibasilar opacities, favor atelectasis. Electronically Signed   By: Feliberto Harts MD   On:  08/20/2020 07:44   DG Chest Port 1 View  Result Date: 08/19/2020 CLINICAL DATA:  Acute respiratory failure. EXAM: PORTABLE CHEST 1 VIEW COMPARISON:  08/18/2020 FINDINGS: 0549 hours. Tracheostomy tube again noted. A feeding tube passes into the stomach although the distal tip position is not included on the film. Right IJ central line tip projects over the proximal SVC level. Low volume film with enlargement of the cardiopericardial silhouette and vascular congestion. Associated component of interstitial edema not excluded. Streaky opacity in the bases suggest atelectasis. No substantial pleural effusion. IMPRESSION: 1. Low volume film with enlargement of the cardiopericardial silhouette and vascular congestion. 2. Bibasilar atelectasis. Electronically Signed   By: Kennith Center M.D.   On: 08/19/2020 06:02   DG CHEST PORT 1 VIEW  Result Date: 08/18/2020 CLINICAL DATA:  Respiratory failure. EXAM: PORTABLE CHEST 1 VIEW COMPARISON:  08/17/2020 FINDINGS: 0646 hours. The cardio pericardial silhouette is enlarged. There is pulmonary vascular congestion without overt pulmonary edema. Tracheostomy tube again noted. Right IJ central line largely obscured by telemetry wires. IMPRESSION: Stable exam. Cardiomegaly with pulmonary vascular congestion. Electronically Signed   By: Kennith Center M.D.   On: 08/18/2020 08:39   DG CHEST PORT 1 VIEW  Result Date: 08/17/2020 CLINICAL DATA:  Right CVL placement EXAM: PORTABLE CHEST 1 VIEW COMPARISON:  08/16/2020 FINDINGS: Interval placement of right neck vascular catheter, tip projecting over the superior vena cava near the brachiocephalic confluence. Left-sided vascular catheter remains in position, tip in the similar location. Tracheostomy. Improved aeration of the lungs with persistent bibasilar heterogeneous airspace opacity. Cardiomegaly. Partially imaged enteric tube. IMPRESSION: 1. Interval placement of right neck vascular catheter, tip projecting over the superior  vena cava near the brachiocephalic confluence. 2. Improved aeration of the lungs with persistent bibasilar heterogeneous  airspace opacity, generally consistent with multifocal infection. Electronically Signed   By: Lauralyn Primes M.D.   On: 08/17/2020 15:43   DG CHEST PORT 1 VIEW  Result Date: 08/16/2020 CLINICAL DATA:  Hypoxemia EXAM: PORTABLE CHEST 1 VIEW COMPARISON:  Radiograph 08/15/2020 FINDINGS: *Tracheostomy tube tip in the upper trachea, 6.1 cm from the carina. *Transesophageal tube tip terminates below the margins of imaging, beyond the GE junction. *Left IJ approach central venous catheter tip terminates at the expected location of the left brachiocephalic-caval confluence. *Telemetry leads and support devices overlie the chest. Diminishing lung volumes with globally increased consolidative and interstitial opacity throughout both lungs most coalescent right apex and left upper lung and in the medial left lung base. No visible pneumothorax or effusion. Cardiomediastinal silhouette is likely enlarged though incompletely visualized given overlying opacity. No acute osseous or soft tissue abnormality. IMPRESSION: 1. Globally increased consolidative and interstitial opacity throughout both lungs, most coalescent right apex and left upper lung and in the medial left lung base. Possibly related to the diminished volumes/atelectasis but could also suggest worsening infection or edema. 2. Lines and tubes as above. Electronically Signed   By: Kreg Shropshire M.D.   On: 08/16/2020 01:54   DG CHEST PORT 1 VIEW  Result Date: 08/15/2020 CLINICAL DATA:  Hypoxemia EXAM: PORTABLE CHEST 1 VIEW COMPARISON:  08/14/2020 FINDINGS: Single frontal view of the chest demonstrates stable tracheostomy tube and enteric catheter. Left internal jugular central venous catheter tip overlies brachiocephalic confluence. Artifact external to the patient limits evaluation. Cardiac silhouette is stable. Multifocal bilateral airspace  disease is unchanged. No evidence of effusion or pneumothorax. No acute bony abnormalities. IMPRESSION: 1. Continued widespread bilateral airspace disease which may reflect edema or infection. 2. Support devices as above. Electronically Signed   By: Sharlet Salina M.D.   On: 08/15/2020 21:40   DG CHEST PORT 1 VIEW  Result Date: 08/13/2020 CLINICAL DATA:  Hypoxia. EXAM: PORTABLE CHEST 1 VIEW COMPARISON:  In August 13, 2020 FINDINGS: There is stable tracheostomy tube and nasogastric tube positioning. Persistent diffuse bilateral infiltrates are seen. There is no evidence of a pleural effusion or pneumothorax. There is moderate to marked severity enlargement of the cardiac silhouette. Degenerative changes seen within the mid and lower thoracic spine. IMPRESSION: 1. Stable diffuse bilateral infiltrates. Electronically Signed   By: Aram Candela M.D.   On: 08/13/2020 21:36   DG CHEST PORT 1 VIEW  Result Date: 08/13/2020 CLINICAL DATA:  Hypoxia. EXAM: PORTABLE CHEST 1 VIEW COMPARISON:  August 12, 2020 FINDINGS: There is stable tracheostomy tube and nasogastric tube positioning. Stable diffuse bilateral infiltrates are seen. There is no evidence of a pleural effusion or pneumothorax. The cardiac silhouette is mildly enlarged. The visualized skeletal structures are unremarkable. IMPRESSION: Stable diffuse bilateral infiltrates. Electronically Signed   By: Aram Candela M.D.   On: 08/13/2020 03:27   DG Chest Port 1 View  Result Date: 07/31/2020 CLINICAL DATA:  Respiratory failure EXAM: PORTABLE CHEST 1 VIEW COMPARISON:  August 11, 2020 FINDINGS: A feeding tube terminates below today's film. The tracheostomy tube projects over the tracheal air column. No pneumothorax. Increased opacity in left base obscuring left hemidiaphragm. Increased opacity in the medial right base. No other interval changes or acute abnormalities. IMPRESSION: 1. Increasing infiltrate in the medial right base worrisome for  pneumonia. 2. Increased opacity and possibly a small associated effusion in the left base could represent atelectasis or infiltrate. Electronically Signed   By: Gerome Sam III M.D   On:  07/28/2020 17:19   DG Chest Port 1 View  Result Date: 08/14/2020 CLINICAL DATA:  Respiratory failure. EXAM: PORTABLE CHEST 1 VIEW COMPARISON:  08/10/2020 FINDINGS: Tracheostomy tube remains in place. A new feeding tube is seen coursing into the stomach, although the distal feeding tube is not visualized on this exam. Mild cardiomegaly remains stable. Diffuse bilateral pulmonary airspace disease shows interval improvement since prior exam. IMPRESSION: New feeding tube courses into the stomach, although the distal tip is not visualized on this exam. Improving diffuse bilateral pulmonary airspace disease. Stable cardiomegaly. Electronically Signed   By: Danae OrleansJohn A Stahl M.D.   On: 08/18/2020 11:46   DG Chest Port 1 View  Result Date: 08/10/2020 CLINICAL DATA:  Hypoxia EXAM: PORTABLE CHEST 1 VIEW COMPARISON:  August 09, 2020 chest radiograph and chest CT FINDINGS: Endotracheal tube tip is at the level of T1, 7.9 cm above the carina. Nasogastric tube no longer evident. No appreciable pneumothorax. There is evidence of pneumomediastinum and subcutaneous air. There is airspace opacity bilaterally in the mid and lower lung regions. There is stable cardiac prominence. No adenopathy. No bone lesions. IMPRESSION: Endotracheal tube as described, rather superiorly positioned. No pneumothorax. There is subcutaneous air and pneumomediastinum. Multifocal airspace opacity consistent with multifocal pneumonia noted. Stable cardiac prominence. Electronically Signed   By: Bretta BangWilliam  Woodruff III M.D.   On: 08/10/2020 10:13   DG Chest Portable 1 View  Result Date: 08/20/2020 CLINICAL DATA:  Orogastric tube placement EXAM: PORTABLE CHEST 1 VIEW COMPARISON:  None. FINDINGS: Tracheostomy noted, slightly higher than would be typically  expected, with its tip seen extending to the thoracic inlet just above the level of the clavicular heads. Orogastric tube extends into the a trachea and subsequently into the right lower lobe pulmonary bronchi. There is extensive subcutaneous gas within the neck base bilaterally as well as mild pneumomediastinum present. There is extensive airspace infiltrate within the lungs, right greater than left in keeping with extensive pneumonic infiltrate, pulmonary hemorrhage, or air-filled or pulmonary edema in the acute setting. No pneumothorax or pleural effusion. Cardiac size within normal limits. No acute bone abnormality. IMPRESSION: Tracheostomy in place, slightly higher than would be typically expected. Orogastric tube demonstrates tracheal intubation and extends into the right lower lobe pulmonary bronchi. Extensive subcutaneous gas at the neck base and pneumomediastinum. This may relate to either emergent tracheostomy placement or perforation of the tracheobronchial tree secondary to orogastric tube malposition. This could be further assessed with CT imaging. Extensive bilateral airspace infiltrate, infection versus edema versus hemorrhage. Attempts are being made to contact the managing physician for direct communication at this time. Electronically Signed   By: Helyn NumbersAshesh  Parikh MD   On: 07/30/2020 19:13   EEG adult  Result Date: 08/12/2020 Charlsie QuestYadav, Priyanka O, MD     08/12/2020  3:21 PM Patient Name: Tyler MeyerWill B [name] MRN: [id number] Epilepsy Attending: Charlsie QuestPriyanka O Yadav Referring Physician/Provider: Dr Levy Pupaobert Byrum Date: 08/12/2020 Duration: 25.25 mins Patient history: 50yo M s/p cardiac arrest. EEG to evaluate for seizure. Level of alertness: comatose AEDs during EEG study: Versed Technical aspects: This EEG study was done with scalp electrodes positioned according to the 10-20 International system of electrode placement. Electrical activity was acquired at a sampling rate of 500Hz  and reviewed with a high  frequency filter of 70Hz  and a low frequency filter of 1Hz . EEG data were recorded continuously and digitally stored. Description: EEG showed continuous generalized 3 to 6 Hz theta-delta slowing with overriding 13-15Hz  generalized beta activity. Patient was noted to have  bilateral shoulder and arm jerking with eyes closed at 1429. Concomitant eeg before, during and after the event didn't show any eeg change to suggest seizure.  Hyperventilation and photic stimulation were not performed.   ABNORMALITY -Continuous slow, generalized IMPRESSION: This study is suggestive of severe diffuse encephalopathy, nonspecific etiology but likely related to sedation, anoxic/hypoxic brain injury. At 1429, patient was noted to have bilateral shoulder and arm jerking with eyes closed without concomitant eeg change and was most likely NOT epileptic. No definite seizures or epileptiform discharges were seen throughout the recording. Charlsie Quest   EEG adult  Result Date: 08/10/2020 Charlsie Quest, MD     08/10/2020  1:57 PM Patient Name: Tyler [name] MRN: [id number] Epilepsy Attending: Charlsie Quest Referring Physician/Provider: Emilie Rutter, PA Date: 08/10/2020 Duration: 26.31 mins Patient history: 50yo M s/p cardiac arrest. EEG to evaluate for seizure Level of alertness: comatose AEDs during EEG study: Propofol Technical aspects: This EEG study was done with scalp electrodes positioned according to the 10-20 International system of electrode placement. Electrical activity was acquired at a sampling rate of 500Hz  and reviewed with a high frequency filter of 70Hz  and a low frequency filter of 1Hz . EEG data were recorded continuously and digitally stored. Description: EEG showed continuous generalized 2-3 Hz delta slowing. Generalized periodic discharges with triphasic morphology at 0.75-1Hz  were also noted. EEG was reactive to noxious stimulation.  Hyperventilation and photic stimulation were not performed.    ABNORMALITY -Continuous slow, generalized -Periodic discharges with triphasic morphology, generalized IMPRESSION: This study is suggestive of severe diffuse encephalopathy, nonspecific etiology but likely related to sedation,  anoxic/hypoxic brain injury. Periodic discharges with triphasic morphology were also noted which can be on the ictal-interictal continuum and are most likely secondary to underlying anoxic-hypoxic injury. No seizures were seen throughout the recording.   ECHOCARDIOGRAM COMPLETE  Result Date: 08/10/2020    ECHOCARDIOGRAM REPORT   Patient Name:   Tyler [name] Date of Exam: 08/10/2020 Medical Rec #:  08/12/2020      Height:       68.0 in Accession #:    Gaetano Net     Weight:       276.2 lb Date of Birth:  [birth year]      BSA:          2.344 m Patient Age:    49 years       BP:           112/77 mmHg Patient Gender: M              HR:           80 bpm. Exam Location:  Inpatient Procedure: 2D Echo, Cardiac Doppler, Color Doppler and Intracardiac            Opacification Agent Indications:    Cardiac arrest I46.9  History:        Patient has no prior history of Echocardiogram examinations.  Sonographer:    [id number] RDCS Referring Phys: 10/19/1970 01-16-1979  Sonographer Comments: Technically challenging study due to limited acoustic windows and echo performed with patient supine and on artificial respirator. Image acquisition challenging due to patient body habitus. IMPRESSIONS  1. Left ventricular ejection fraction, by estimation, is Not well visualized%. The left ventricle has not well visualized function. Left ventricular endocardial border not optimally defined to evaluate regional wall motion. The left ventricular internal  cavity size was not well visualized. There is not well visualized left  ventricular hypertrophy. Left ventricular diastolic function could not be evaluated.  2. Right ventricular systolic function was not well visualized. The right ventricular size  is not well visualized.  3. The mitral valve is rheumatic. No evidence of mitral valve regurgitation. No evidence of mitral stenosis.  4. Tricuspid valve regurgitation not well visualized.  5. The aortic valve was not well visualized. Aortic valve regurgitation not well visualized.  6. The inferior vena cava is dilated in size with <50% respiratory variability, suggesting right atrial pressure of 15 mmHg. Comparison(s): No prior Echocardiogram. Conclusion(s)/Recommendation(s): Poor windows for evaluation of left ventricular function by transthoracic echocardiography. Would recommend an alternative means of evaluation. Very limited images, poor windows and no visualization with echo contrast. Cannot assess LV function, though appears abnormal on limited views. Recommend alternative evaluation. FINDINGS  Left Ventricle: Left ventricular ejection fraction, by estimation, is Not well visualized%. The left ventricle has not well visualized function. Left ventricular endocardial border not optimally defined to evaluate regional wall motion. Definity contrast agent was given IV to delineate the left ventricular endocardial borders. The left ventricular internal cavity size was not well visualized. There is not well visualized left ventricular hypertrophy. Left ventricular diastolic function could not  be evaluated. Right Ventricle: The right ventricular size is not well visualized. Right vetricular wall thickness was not well visualized. Right ventricular systolic function was not well visualized. Left Atrium: Left atrial size was not well visualized. Right Atrium: Right atrial size was not well visualized. Pericardium: The pericardium was not well visualized. Mitral Valve: The mitral valve is rheumatic. There is moderate calcification of the mitral valve leaflet(s). No evidence of mitral valve regurgitation. No evidence of mitral valve stenosis. Tricuspid Valve: The tricuspid valve is not well visualized. Tricuspid valve  regurgitation not well visualized. Aortic Valve: The aortic valve was not well visualized. Aortic valve regurgitation not well visualized. Pulmonic Valve: The pulmonic valve was not well visualized. Pulmonic valve regurgitation is not visualized. Aorta: The ascending aorta was not well visualized and the aortic root was not well visualized. Venous: The inferior vena cava is dilated in size with less than 50% respiratory variability, suggesting right atrial pressure of 15 mmHg. IAS/Shunts: The atrial septum is grossly normal.   Diastology LV e' medial:    5.78 cm/s LV E/e' medial:  7.8 LV e' lateral:   6.31 cm/s LV E/e' lateral: 7.1  AORTIC VALVE LVOT Vmax:   68.40 cm/s LVOT Vmean:  48.700 cm/s LVOT VTI:    0.135 m  AORTA Ao Desc diam: 2.30 cm MITRAL VALVE MV Area (PHT): 3.99 cm    SHUNTS MV Decel Time: 190 msec    Systemic VTI: 0.14 m MV E velocity: 45.00 cm/s MV A velocity: 54.00 cm/s MV E/A ratio:  0.83 Jodelle Red MD Electronically signed by Jodelle Red MD Signature Date/Time: 08/10/2020/2:26:09 PM    Final    CT HEAD CODE STROKE WO CONTRAST  Result Date: 09/22/2020 CLINICAL DATA:  Code stroke.  Acute neurologic deficit. EXAM: CT HEAD WITHOUT CONTRAST TECHNIQUE: Contiguous axial images were obtained from the base of the skull through the vertex without intravenous contrast. COMPARISON:  None. FINDINGS: Brain: Massive intraparenchymal hemorrhage within the left parietal/temporal lobe measuring 4.8 x 7.4 x 4.7 cm (volume = 87 cm^3). There is downward transtentorial herniation of the brainstem and left uncus. There is also large amount of subarachnoid hemorrhage extending into the ventricles. There is obstructive hydrocephalus with a dilated right lateral ventricle. Rightward midline shift measures 12 mm.  Vascular: No abnormal hyperdensity of the major intracranial arteries or dural venous sinuses. No intracranial atherosclerosis. Skull: The visualized skull base, calvarium and extracranial  soft tissues are normal. Sinuses/Orbits: No fluid levels or advanced mucosal thickening of the visualized paranasal sinuses. No mastoid or middle ear effusion. The orbits are normal. IMPRESSION: 1. Massive intraparenchymal hemorrhage within the left parietal/temporal lobe with downward transtentorial herniation of the brainstem and left uncus. 2. Large amount of subarachnoid hemorrhage extending into the ventricles. 3. Obstructive hydrocephalus with a dilated right lateral ventricle. Critical Value/emergent results were called by telephone at the time of interpretation on 09/19/2020 at 2:34 am to provider Dr. Lavera Guise, who verbally acknowledged these results. Electronically Signed   By: Ulyses Jarred M.D.   On: 08/29/2020 02:34    Microbiology Recent Results (from the past 240 hour(s))  Culture, blood (routine x 2)     Status: None (Preliminary result)   Collection Time: 08/31/2020  8:09 AM   Specimen: BLOOD  Result Value Ref Range Status   Specimen Description BLOOD LEFT ANTECUBITAL  Final   Special Requests   Final    BOTTLES DRAWN AEROBIC AND ANAEROBIC Blood Culture results may not be optimal due to an inadequate volume of blood received in culture bottles   Culture   Final    NO GROWTH 4 DAYS Performed at [facility], [place] [address] [place], [place] [address]    Report Status PENDING  Incomplete  Culture, blood (routine x 2)     Status: None (Preliminary result)   Collection Time: 08/25/2020  8:22 AM   Specimen: BLOOD  Result Value Ref Range Status   Specimen Description BLOOD LEFT ANTECUBITAL  Final   Special Requests   Final    BOTTLES DRAWN AEROBIC ONLY Blood Culture adequate volume   Culture   Final    NO GROWTH 4 DAYS Performed at [facility], [place] [address], [place], [place] [address]    Report Status PENDING  Incomplete    Lab Basic Metabolic Panel: Recent Labs  Lab 09/08/2020 0142 Sep 08, 2020 1649 08/25/20 0112 08/25/20 1657 08/25/20 1703 09/11/2020 0242  09/12/2020 0636 09/19/2020 0853 09/13/2020 0910  NA 133* 132* 134* 134* 133* 133* 135  135 133* 133*  K 5.1 4.8 6.1* 4.3 4.4 4.5 4.5  4.4 4.5 4.4  CL 95* 96* 99 94*  --   --  95*  95*  --   --   CO2 23 19* 15* 25  --   --  22  23  --   --   GLUCOSE 151* 189* 136* 240*  --   --  178*  202*  --   --   BUN 69* 77* 83* 76*  --   --  88*  88*  --   --   CREATININE 3.81* 4.08* 4.29* 4.01*  --   --  5.31*  5.19*  --   --   CALCIUM 9.5 9.3 9.0 9.4  --   --  8.8*  8.8*  --   --   MG 3.4*  --  3.2*  --   --   --  2.9*  --   --   PHOS 7.6* 9.0* 8.4* 8.0*  --   --  9.1*  --   --    Liver Function Tests: Recent Labs  Lab Sep 08, 2020 0142 08-Sep-2020 1649 08/25/20 0112 08/25/20 1657 09/06/2020 0636  ALBUMIN 3.5 3.3* 3.3* 3.4* 3.1*   No results for input(s):  LIPASE, AMYLASE in the last 168 hours. No results for input(s): AMMONIA in the last 168 hours. CBC: Recent Labs  Lab 08/13/2020 0421 08/25/20 1703 28-Aug-2020 0242 August 28, 2020 0636 2020/08/28 0853 2020-08-28 0910  WBC 20.0*  --   --  24.8*  --   --   NEUTROABS 14.4*  --   --  17.1*  --   --   HGB 13.0 12.6* 11.9* 10.4* 10.2* 10.9*  HCT 39.0 37.0* 35.0* 31.3* 30.0* 32.0*  MCV 97.5  --   --  98.1  --   --   PLT 393  --   --  197  --   --    Cardiac Enzymes: No results for input(s): CKTOTAL, CKMB, CKMBINDEX, TROPONINI in the last 168 hours. Sepsis Labs: Recent Labs  Lab 08/14/2020 0421 08-28-20 0636  WBC 20.0* 24.8*  LATICACIDVEN  --  1.2    Procedures/Operations  Mechanical ventilation, arterial and central venous line placement, continuous renal replacement.   Aneka Fagerstrom 08/30/2020, 11:53 AM

## 2020-09-25 NOTE — Progress Notes (Addendum)
Pharmacy Antibiotic Note  Tyler [name] is a 50 y.o. male admitted on 08/21/2020 s/p cardiac arrest, VRDF on CRRT, now with fevers and possible sepsis .  Pharmacy has been consulted for Vancomycin and Meropenem dosing.  Plan: Vancomycin 2000 mg IV now, then 1000 mg IV q24h Meropenem 1 g IV q12h  Height: 5\' 8"  (172.7 cm) Weight: 104.2 kg (229 lb 11.5 oz) IBW/kg (Calculated) : 68.4  Temp (24hrs), Avg:98.9 F (37.2 C), Min:98.3 F (36.8 C), Max:100.8 F (38.2 C)  Recent Labs  Lab 08/19/20 0429 08/19/20 1616 08/20/20 0413 08/20/20 1608 08/22/20 0336 08/23/20 0357 08/23/20 1600 08/05/2020 0142 08/11/2020 0421 07/26/2020 1649 08/25/20 0112 08/25/20 1657  WBC 19.3*  --  17.8*  --  23.4*  --   --   --  20.0*  --   --   --   CREATININE  --    < > 3.80*   < > 3.90*   < > 3.61* 3.81*  --  4.08* 4.29* 4.01*   < > = values in this interval not displayed.    Estimated Creatinine Clearance: 26.1 mL/min (A) (by C-G formula based on SCr of 4.01 mg/dL (H)).    No Known Allergies   Tyler [name], 10/23/20 2020/09/06 1:32 AM

## 2020-09-25 NOTE — Progress Notes (Signed)
Pt neuro status sudden change No response to pain, gag, cough, pupil, or corneal reflex. None intact Hypotensive, MAPs in 50s  CCM notified, levo and albumin ordered

## 2020-09-25 NOTE — Progress Notes (Signed)
RT placed patient back on ventilator at this time. Patient continues to have desat on trach collar, patient had increase secretions and increased WOB. RT had to bag and lavage patient to bring saturations back up. Patient is back on the ventilator and tolerating well.

## 2020-09-25 NOTE — Progress Notes (Signed)
NAME:  Tyler [name], MRN:  [id number], DOB:  [birth year], LOS: 17 ADMISSION DATE:  08/13/2020, CONSULTATION DATE:  09/08/2020 REFERRING MD:  EDP, CHIEF COMPLAINT: Cardiac Arrest  Brief History:  50 year old male with history of GERD, PTSD, esophageal stricture who presents with witnessed out of hospital PEA arrest that occurred while lifting at heavy object overhead at work.  Unknown total downtime prior to hospitalization, but patient was difficult to bag with BVM/ king airway and required emergent cricothyroidotomy by EMS.  PCCM consulted for admission  Past Medical History:  GERD, esophageal stenosis, PTSD, obesity  Significant Hospital Events:  12/16 Witnessed PEA arrest. TTM  12/18 OR s/p tracheostomy by Dr. Suszanne Conners. Shivering, HTN, normothermia. Vent desynchrony, paralytics x 2 and started on Cardene gtt  12/20 ARDS, peep increased, lasix drip ordered, nephrology consult  12/21 CRRT started 12/23 Worsening ARDS, Sats 60's, pt proned  12/24 HD cath replaced.  12/25 Paralytics stopped  Consults:  ENT - Dr. Suszanne Conners 12/17  Procedures:  Trach Revision (Dr. Suszanne Conners) 12/18 >>  R Fem TLC 12/16 >>  HD Cath 12/21 >>   Significant Diagnostic Tests:   12/16 CXR >> Extensive subcutaneous gas at the neck base and pneumomediastinum. This may relate to either emergent tracheostomy placement or perforation of the tracheobronchial tree secondary to orogastric tube malposition. Extensive bilateral airspace infiltrate, infection versus edema versus hemorrhage.  12/16 CT head>> No acute intracranial abnormality.  12/16 CT chest >> No pulmonary embolus to the segmental level. The subsegmental branches are not well assessed due to contrast bolus timing and likely diminished cardiac perfusion.  Extensiv pneumomediastinum extending from the thoracic inlet into the upper abdomen. Subcutaneous emphysema in the neck. Dense symmetric consolidations in both lower lobes, with lesser ground-glass and consolidative  opacities in the dependent upper lobes, right middle lobe, and lingula. Findings most consistent with aspiration. Superimposed pulmonary edema is also considered.  Multi chamber cardiomegaly with contrast refluxing into the hepatic veins and IVC consistent with elevated right heart pressures.  12/17 TTE >>LV function difficult to visualize due to poor windows, RV size and function poorly visualized  12/17 EEG >> severe diffuse encephalopathy, periodic triphasic discharges without overt seizure activity  12/17 renal ultrasound >> normal kidneys  12/19 EEG >> severe encephalopathy   Micro Data:  COVID, Influenza 12/16 >> negative  BCx2 12/16 >> negative  UC 12/17 >> negative Tracheal aspirate 12/22 >>  BCx2 12/22 >>   Antimicrobials:  Vanc 12/16 >>12/16 Zosyn 12/16 >>12/16, 12/24 >>  Unasyn 12/16 >> 12/21  Interim History / Subjective:   Declined examination overnight with bradycardia and fixed and dilated pupils.  Lost all brainstem reflexes.  CT scan shows massive left intraparenchymal hemorrhage with mass-effect and uncal herniation.  Objective   Blood pressure 138/67, pulse (!) 116, temperature (!) 101.5 F (38.6 C), resp. rate (!) 0, height 5\' 8"  (1.727 m), weight 104.2 kg, SpO2 100 %. CVP:  [3 mmHg-21 mmHg] 12 mmHg  Vent Mode: PRVC FiO2 (%):  [28 %-60 %] 50 % Set Rate:  [20 bmp] 20 bmp Vt Set:  [550 mL] 550 mL PEEP:  [8 cmH20] 8 cmH20 Plateau Pressure:  [22 cmH20] 22 cmH20   Intake/Output Summary (Last 24 hours) at 09/20/2020 1241 Last data filed at 09/14/2020 0800 Gross per 24 hour  Intake 1855.22 ml  Output 1351 ml  Net 504.22 ml   Filed Weights   08/22/20 0500 08/23/20 0431 08/25/20 0500  Weight: 107 kg 106.3 kg 104.2 kg  Gen:      No acute distress, obese HEENT:  EOMI, sclera anicteric Neck:     No masses; no thyromegaly, ET tube Lungs:    Clear to auscultation bilaterally; normal respiratory effort CV:         Regular rate and rhythm; no murmurs Abd:      +  bowel sounds; soft, non-tender; no palpable masses, no distension Ext:    No edema; adequate peripheral perfusion Skin:    Warm and dry; no rash Neuro: Pupils fixed and dilated no cough no gag no response to pain no doll's eyes    Resolved Hospital Problem list   Thrombocytopenia Hyperkalemia Afib  Assessment & Plan:  Cardiac Arrest Acute hypoxic respiratory failure secondary to cardiac arrest  Difficult airway s/p emergent cricothyroidotomy by EMS Aspiration PNA resulting in ARDS Pneumomediastinum 2/2 cricothyroidotomy Acute Anoxic Encephalopathy little improvement suggests patient is likely to be in an indefinite minimally responsive state Acute kidney injury, nearly anuric, suspect ATN from prolonged hypotension AGMA / lactic acidosis  Hx of esophageal stricture At Risk Malnutrition  Hyperglycemia / Pre-Diabetes  Critically ill due to massive and unsurvivable intraparenchymal hemorrhage likely in the area of prior cerebral infarction related to cardiac arrest Cerebral edema with herniation Critically ill due to cardiogenic and distributive shock related to cerebral herniation.  Plan:  -Full ventilatory support -Titrate norepinephrine to keep MAP greater than 65 -Patient clinically meets criteria for brain death.  We Yancy proceed with formal apnea testing.  CRITICAL CARE Performed by: Lynnell Catalan   Total critical care time: 30 minutes  Critical care time was exclusive of separately billable procedures and treating other patients.  Critical care was necessary to treat or prevent imminent or life-threatening deterioration.  Critical care was time spent personally by me on the following activities: development of treatment plan with patient and/or surrogate as well as nursing, discussions with consultants, evaluation of patient's response to treatment, examination of patient, obtaining history from patient or surrogate, ordering and performing treatments and  interventions, ordering and review of laboratory studies, ordering and review of radiographic studies, pulse oximetry, re-evaluation of patient's condition and participation in multidisciplinary rounds.  Lynnell Catalan, MD [facility] ICU Physician [facility]  Pager: [phone] Mobile: [phone] After hours: [phone].    08/25/2020, 12:41 PM

## 2020-09-25 NOTE — Progress Notes (Signed)
RT NOTES: Apnea test performed. Pre ABG obtained. Patient removed from vent with O2 catheter in place for 10 minutes. Post ABG obtained. MD, RN, and 2nd RT at bedside. Patient passed apnea test. Brain death confirmed. Patient removed from vent per MD order.
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
## 2020-09-25 NOTE — Progress Notes (Signed)
Adult Brain Death Determination  Time of Examination: 08-28-20 8:57 AM  A. No Evidence of /Cause of Reversible CNS Depression  Core temperature must be greater >36 degrees. Last temp: Temp: (!) 101.5 F (38.6 C) (Note: If unable to achieve normothermia after 12 hours of temperature management, may consider proceeding with Brain Death Evaluation.):    yes  1. Evidence of severe metabolic perturbations that could potentate CNS depression. Consider glucose, Na, creatinine, PaCO2, SaO2.:    Absent  2. Evidence of drugs, by history or measurement, that could potentiate central nervous system depression: narcotics, ethanol, benzodiazepines, barbiturates, neuromuscular blockade.:     Absent  B. Absence of Cortical Function  1. GCS = 3:    yes  C. Absence of Brain Stem Reflexes and Responses  1. Pupils light-fixed    yes  2. Corneal reflexes:    Absent  3. Response to upper and lower airway stimulation, such as pharyngeal and endotracheal suctioning.:    Absent  4. Ocular response to head turning (eye movement).    Absent  D. Absence of Spontaneous Respirations  (Apnea test performed per Brain Death Policy. If not met due to hemodynamic/ventilatory instability, then perform EEG, TCD, or cerebral blow flow studies.)  1.   Spontaneous Respirations   Absent  2.   PaCO2 at start of apnea test:  47 mmHg  3.   PaCO2 at end of apnea test:  85  4.   CO2 rise of 20 or greater from baseline:   yes  E. Document Confirmatory Test Utilized: (Optional) Nuclear cerebral flow, cerebral angiography (CT/MR angio), transcranial Doppler ultrasound, EEG, SSEP (record results).  1. Test results (if available):  Ancillary testing not required.   Patient pronounced dead by neurological criteria at 09:11 AM on 08/27/2019.  Patient family was present and was informed of the purpose, method and results of the test.    Kipp Brood, MD 08-28-20 8:57 AM

## 2020-09-25 NOTE — Consult Note (Addendum)
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 imaging. Extensive bilateral airspace infiltrate, infection versus edema versus hemorrhage. Attempts are being made to contact the managing physician for direct communication at this time. Electronically Signed   By: Helyn Numbers MD   On: 07/26/2020 19:13   EEG adult  Result Date: 08/12/2020 Charlsie Quest, MD     08/12/2020  3:21 PM Patient Name: GARLON TUGGLE MRN: [id number] Epilepsy Attending: Charlsie Quest Referring Physician/Provider: Dr Levy Pupa Date: 08/12/2020 Duration: 25.25 mins Patient history: 50yo M s/p cardiac arrest. EEG to evaluate for seizure. Level of alertness: comatose AEDs during EEG study: Versed Technical aspects: This EEG study was done with scalp electrodes positioned according to the 10-20 International system of electrode placement. Electrical activity was acquired at a sampling rate of 500Hz  and reviewed with a high frequency filter of 70Hz  and a low frequency filter of 1Hz . EEG data were recorded continuously and digitally stored. Description: EEG showed continuous generalized 3 to 6 Hz theta-delta slowing with overriding 13-15Hz  generalized beta activity. Patient was noted to have bilateral shoulder  and arm jerking with eyes closed at 1429. Concomitant eeg before, during and after the event didn't show any eeg change to suggest seizure.  Hyperventilation and photic stimulation were not performed.   ABNORMALITY -Continuous slow, generalized IMPRESSION: This study is suggestive of severe diffuse encephalopathy, nonspecific etiology but likely related to sedation, anoxic/hypoxic brain injury. At 1429, patient was noted to have bilateral shoulder and arm jerking with eyes closed without concomitant eeg change and was most likely NOT epileptic. No definite seizures or epileptiform discharges were seen throughout the recording. Charlsie Quest   EEG adult  Result Date: 08/10/2020 Charlsie Quest, MD     08/10/2020  1:57 PM Patient Name: ZAELYN BARBARY MRN: [id number] Epilepsy Attending: Charlsie Quest Referring Physician/Provider: Emilie Rutter, PA Date: 08/10/2020 Duration: 26.31 mins Patient history: 50yo M s/p cardiac arrest. EEG to evaluate for seizure Level of alertness: comatose AEDs during EEG study: Propofol Technical aspects: This EEG study was done with scalp electrodes positioned according to the 10-20 International system of electrode placement. Electrical activity was acquired at a sampling rate of 500Hz  and reviewed with a high frequency filter of 70Hz  and a low frequency filter of 1Hz . EEG data were recorded continuously and digitally stored. Description: EEG showed continuous generalized 2-3 Hz delta slowing. Generalized periodic discharges with triphasic morphology at 0.75-1Hz  were also noted. EEG was reactive to noxious stimulation.  Hyperventilation and photic stimulation were not performed.   ABNORMALITY -Continuous slow, generalized -Periodic discharges with triphasic morphology, generalized IMPRESSION: This study is suggestive of severe diffuse encephalopathy, nonspecific etiology but likely related to sedation,  anoxic/hypoxic brain injury. Periodic discharges with triphasic morphology  were also noted which can be on the ictal-interictal continuum and are  most likely secondary to underlying anoxic-hypoxic injury. No seizures were seen throughout the recording. Charlsie Quest   ECHOCARDIOGRAM COMPLETE  Result Date: 08/10/2020    ECHOCARDIOGRAM REPORT   Patient Name:   Taje OSSIEL MARCHIO Date of Exam: 08/10/2020 Medical Rec #:  [id number]      Height:       68.0 in Accession #:    [id number]     Weight:       276.2 lb Date of Birth:  05-22-1971      BSA:          2.344 m Patient Age:    49 years       BP:           112/77 mmHg Patient Gender: M              HR:           80 bpm. Exam Location:  Inpatient Procedure: 2D Echo, Cardiac Doppler, Color Doppler and Intracardiac            Opacification Agent Indications:    Cardiac arrest I46.9  History:        Patient has no prior history of Echocardiogram examinations.  Sonographer:    Renella Cunas RDCS Referring Phys: [id number] Aliene Beams  Sonographer Comments: Technically challenging study due to limited acoustic windows and echo performed with patient supine and on artificial respirator. Image acquisition challenging due to patient body habitus. IMPRESSIONS  1. Left ventricular ejection fraction, by estimation, is Not well visualized%. The left ventricle has not well visualized function. Left ventricular endocardial border not optimally defined to evaluate regional wall motion. The left ventricular internal  cavity size was not well visualized. There is not well visualized left ventricular hypertrophy. Left ventricular diastolic function could not be evaluated.  2. Right ventricular systolic function was not well visualized. The right ventricular size is not well visualized.  3. The mitral valve is rheumatic. No evidence of mitral valve regurgitation. No evidence of mitral stenosis.  4. Tricuspid valve regurgitation not well visualized.  5. The aortic valve was not well visualized. Aortic valve regurgitation not well visualized.  6. The inferior  vena cava is dilated in size with <50% respiratory variability, suggesting right atrial pressure of 15 mmHg. Comparison(s): No prior Echocardiogram. Conclusion(s)/Recommendation(s): Poor windows for evaluation of left ventricular function by transthoracic echocardiography. Would recommend an alternative means of evaluation. Very limited images, poor windows and no visualization with echo contrast. Cannot assess LV function, though appears abnormal on limited views. Recommend alternative evaluation. FINDINGS  Left Ventricle: Left ventricular ejection fraction, by estimation, is Not well visualized%. The left ventricle has not well visualized function. Left ventricular endocardial border not optimally defined to evaluate regional wall motion. Definity contrast agent was given IV to delineate the left ventricular endocardial borders. The left ventricular internal cavity size was not well visualized. There is not well visualized left ventricular hypertrophy. Left ventricular diastolic function could not  be evaluated. Right Ventricle: The right ventricular size is not well visualized. Right vetricular wall thickness was not well visualized. Right ventricular systolic function was not well visualized. Left Atrium: Left atrial size was not well visualized. Right Atrium: Right atrial size was not well visualized. Pericardium: The pericardium was not well visualized. Mitral Valve: The mitral valve is rheumatic. There is moderate calcification of the mitral valve leaflet(s). No evidence of mitral valve regurgitation. No evidence of mitral valve stenosis. Tricuspid Valve: The tricuspid valve is not well  visualized. Tricuspid valve regurgitation not well visualized. Aortic Valve: The aortic valve was not well visualized. Aortic valve regurgitation not well visualized. Pulmonic Valve: The pulmonic valve was not well visualized. Pulmonic valve regurgitation is not visualized. Aorta: The ascending aorta was not well visualized  and the aortic root was not well visualized. Venous: The inferior vena cava is dilated in size with less than 50% respiratory variability, suggesting right atrial pressure of 15 mmHg. IAS/Shunts: The atrial septum is grossly normal.   Diastology LV e' medial:    5.78 cm/s LV E/e' medial:  7.8 LV e' lateral:   6.31 cm/s LV E/e' lateral: 7.1  AORTIC VALVE LVOT Vmax:   68.40 cm/s LVOT Vmean:  48.700 cm/s LVOT VTI:    0.135 m  AORTA Ao Desc diam: 2.30 cm MITRAL VALVE MV Area (PHT): 3.99 cm    SHUNTS MV Decel Time: 190 msec    Systemic VTI: 0.14 m MV E velocity: 45.00 cm/s MV A velocity: 54.00 cm/s MV E/A ratio:  0.83 Jodelle RedBridgette Christopher MD Electronically signed by Jodelle RedBridgette Christopher MD Signature Date/Time: 08/10/2020/2:26:09 PM    Final    CT HEAD CODE STROKE WO CONTRAST  Result Date: 08/25/2020 CLINICAL DATA:  Code stroke.  Acute neurologic deficit. EXAM: CT HEAD WITHOUT CONTRAST TECHNIQUE: Contiguous axial images were obtained from the base of the skull through the vertex without intravenous contrast. COMPARISON:  None. FINDINGS: Brain: Massive intraparenchymal hemorrhage within the left parietal/temporal lobe measuring 4.8 x 7.4 x 4.7 cm (volume = 87 cm^3). There is downward transtentorial herniation of the brainstem and left uncus. There is also large amount of subarachnoid hemorrhage extending into the ventricles. There is obstructive hydrocephalus with a dilated right lateral ventricle. Rightward midline shift measures 12 mm. Vascular: No abnormal hyperdensity of the major intracranial arteries or dural venous sinuses. No intracranial atherosclerosis. Skull: The visualized skull base, calvarium and extracranial soft tissues are normal. Sinuses/Orbits: No fluid levels or advanced mucosal thickening of the visualized paranasal sinuses. No mastoid or middle ear effusion. The orbits are normal. IMPRESSION: 1. Massive intraparenchymal hemorrhage within the left parietal/temporal lobe with downward  transtentorial herniation of the brainstem and left uncus. 2. Large amount of subarachnoid hemorrhage extending into the ventricles. 3. Obstructive hydrocephalus with a dilated right lateral ventricle. Critical Value/emergent results were called by telephone at the time of interpretation on 08/25/2020 at 2:34 am to provider Dr. Jerel ShepherdJ. Alwaleed Obeso, who verbally acknowledged these results. Electronically Signed   By: Deatra RobinsonKevin  Herman M.D.   On: 09/15/2020 02:34    Assessment: 50 y.o. male with PMH of GERD, PTSD and esophageal stricture who was admitted on 08/13/2020 after a witnessed out of hospital PEA arrest.  Exact downtime unclear. Patient condition developed ARDS on ventilation, AKI needed CRRT.  However, neurological status has no improvement.  Neurology was called on 08/23/2020 for prognostication, consistent with anoxic brain injury. Per RN, yesterday 08/26/19 pt still reactive to pain stimulation but not moving right UE. Around 11:30pm 08/26/19, pt was found to have acute neuro change without brainstem reflexes and hypotension, exam resembles brain death.  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 This is not survivable. Discussed with Dr. Judeth HornHunsucker from [facility], he Lazar inform family for this devastating event, poor prognosis and likely already brain death.   Marvel PlanJindong Shanvi Moyd, MD PhD Stroke Neurology 09/13/2020 3:16 AM  This patient is critically ill due to large left hemisphere ICH, SAH and IVH and at  significant risk of brain herniation and brain death. This patient's care requires constant monitoring of vital signs, hemodynamics, respiratory and cardiac monitoring, review of multiple databases, neurological assessment, discussion with family, other specialists and medical decision making of high complexity. I spent 40 minutes of neurocritical care time in the care of this patient. I discussed with Dr. Silas Flood.

## 2020-09-25 NOTE — Progress Notes (Addendum)
PCCM Progress Note  In persistent comatose state after cardiac arrest. New neuro change per RN. Informed by nurse patient has no cough, gag, pupils non reactive. STAT CT head ordered.  CT head resulted with massive intraparenchymal hemorrhage within the left parietal/temporal lobe with downward transtentorial herniation of the brainstem and left uncus. Discussed case with on call neurologist. No brainstem reflexes. Does not respond to noxious stimuli. Called daughter (designated Management consultant) to [employer]. Understandably upset. Shared that patient is essentially brain dead. Brain bleed Rocko result in his physical death. Shared there is a process to go through for additional confirmation. Given worsened clinical status, recommend DNR. She was not amenable initially. Explained to her that given neurologic status any resuscitative efforts Roc be futile. She expressed understanding stating "ok." Code status changed to DNR. Continue ongoing care. Recommend comfort care but anticipate full brain death testing Cornelious be needed to reach that decision. Defer this testing to day team. Daughter wishes to visit and given clinical change which I encouraged. This has been graciously arranged by [facility] nursing staff.  Similar discussion was had with patient significant other shortly after getting off phone with patient daughter.  CRITICAL CARE Performed by: Lesia Sago Humberto Addo   Total critical care time: 32 minutes  Critical care time was exclusive of separately billable procedures and treating other patients.  Critical care was necessary to treat or prevent imminent or life-threatening deterioration.  Critical care was time spent personally by me on the following activities: development of treatment plan with patient and/or surrogate as well as nursing, discussions with consultants, evaluation of patient's response to treatment, examination of patient, obtaining history from patient or surrogate, ordering and  performing treatments and interventions, ordering and review of laboratory studies, ordering and review of radiographic studies, pulse oximetry and re-evaluation of patient's condition.   Karren Burly, MD

## 2020-09-25 NOTE — Progress Notes (Signed)
Nephrology Brief Note  Patient with worsening neurological status overnight found to have massive ICH with SAH, IVH, uncal herniation, and hydrocephalus.  No longer with reflexes.  CRRT stopped at 2 AM to transport for imaging.  Not restarted at this time.  Given the central brain death with no chance of recovery per neurology we Duell not resume CRRT at this time and Marwin sign off.  If further assistance is needed from nephrology in the future please contact us.

## 2020-09-25 NOTE — Code Documentation (Signed)
Stroke Response Nurse Documentation Code Documentation  Tyler [name] is a 50 y.o. male s/p cardiac arrest 12/16. Code stroke was activated by MD on 1/2 due to nonreactive pupils and loss of brainstem reflexes. LKW at 12/16 . On heparin IV through CRRT. Stroke team at the bedside. Dr. Roda Shutters came to bedside. Patient to CT with team. NIHSS 34, see documentation for details and code stroke times. Patient with decreased LOC, disoriented, not following commands, bilateral hemianopia, right facial droop, bilateral arm weakness, bilateral leg weakness, bilateral decreased sensation, Global aphasia  and dysarthria  on exam. The following imaging was completed:  CTH with Left SAH. Bedside handoff with Faith RN .    Rose Fillers  Rapid Response RN

## 2020-09-25 NOTE — Progress Notes (Signed)
RT transported patient to CT and back to 2H without any complications.  

## 2020-09-25 DEATH — deceased

## 2022-05-17 IMAGING — DX DG CHEST 1V PORT
1 series · 1 of 1 positions shown · non-contrast
Comparison: None.

CLINICAL DATA: Orogastric tube placement

EXAM:
PORTABLE CHEST 1 VIEW

[chest]
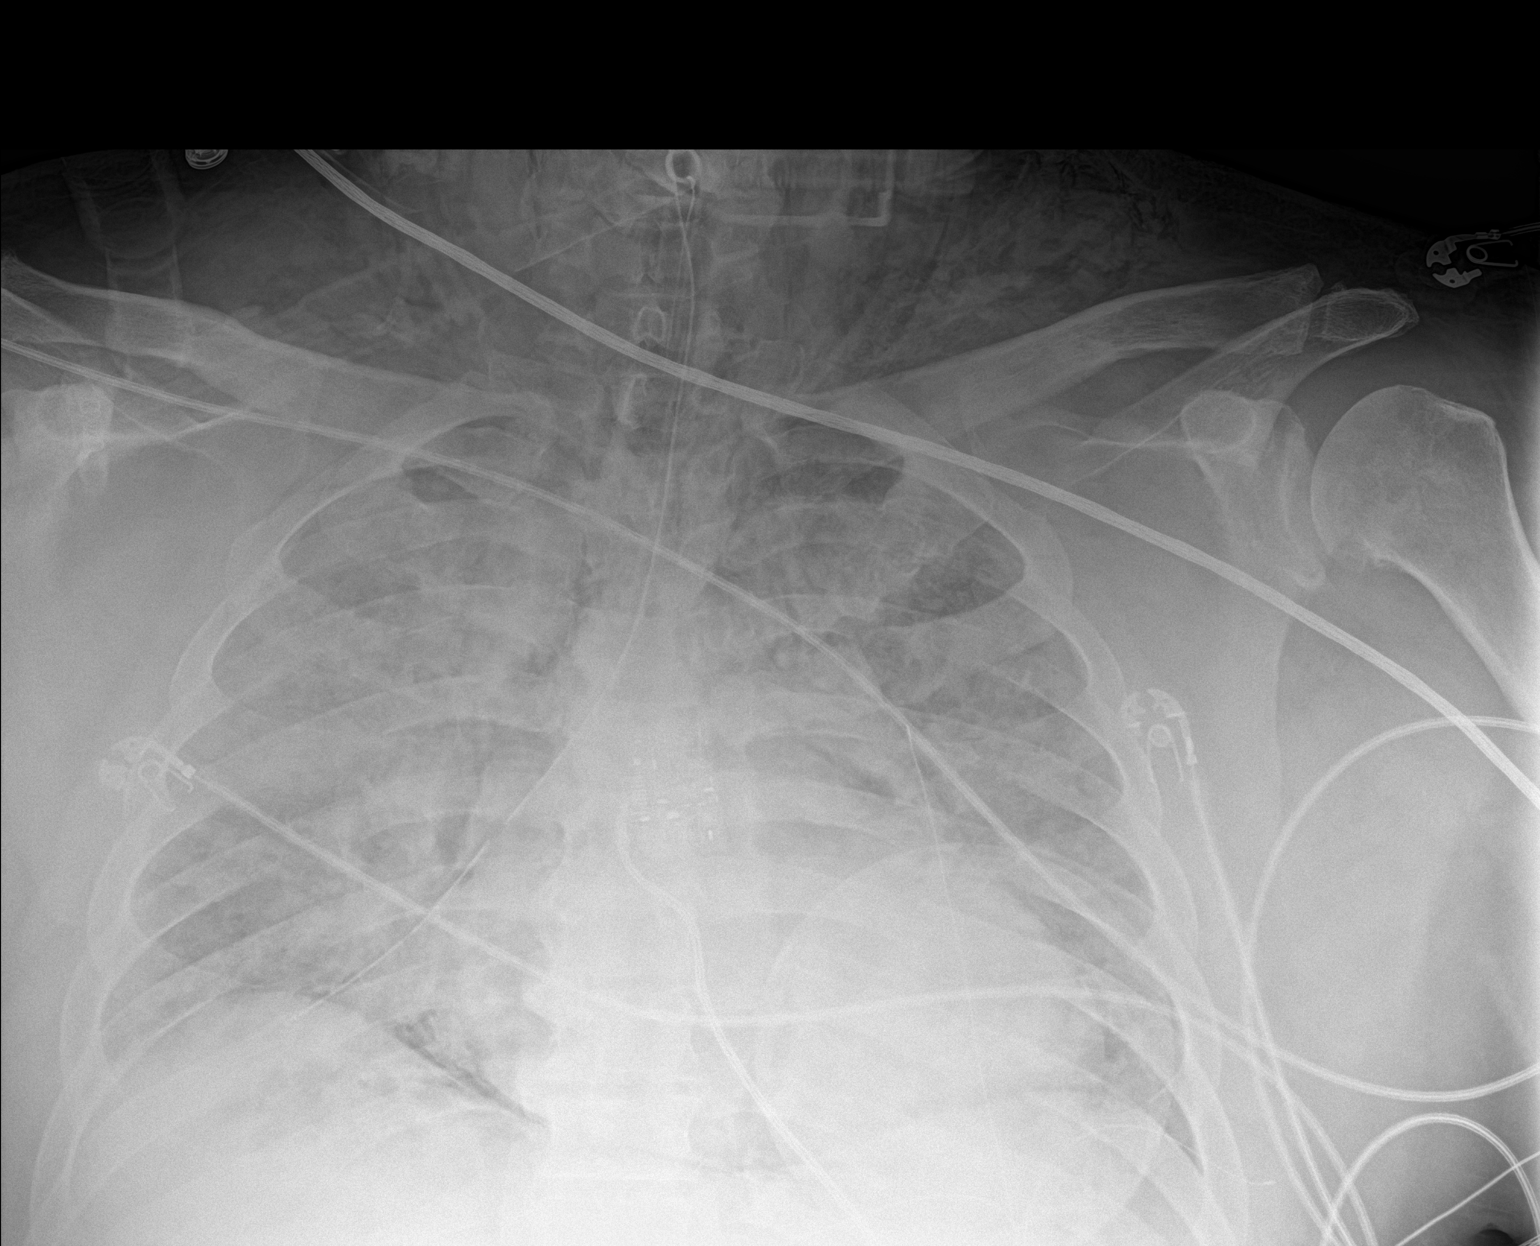

[1 of 1 positions shown; findings below may reference images not displayed]

FINDINGS: Tracheostomy noted, slightly higher than would be typically
expected, with its tip seen extending to the thoracic inlet just
above the level of the clavicular heads. Orogastric tube extends
into the a trachea and subsequently into the right lower lobe
pulmonary bronchi. There is extensive subcutaneous gas within the
neck base bilaterally as well as mild pneumomediastinum present.

There is extensive airspace infiltrate within the lungs, right
greater than left in keeping with extensive pneumonic infiltrate,
pulmonary hemorrhage, or air-filled or pulmonary edema in the acute
setting. No pneumothorax or pleural effusion. Cardiac size within
normal limits. No acute bone abnormality.
IMPRESSION: Tracheostomy in place, slightly higher than would be typically
expected.

Orogastric tube demonstrates tracheal intubation and extends into
the right lower lobe pulmonary bronchi.

Extensive subcutaneous gas at the neck base and pneumomediastinum.
This may relate to either emergent tracheostomy placement or
perforation of the tracheobronchial tree secondary to orogastric
tube malposition. This could be further assessed with CT imaging.

Extensive bilateral airspace infiltrate, infection versus edema
versus hemorrhage.

Attempts are being made to contact the managing physician for direct
communication at this time.

## 2022-05-17 IMAGING — CT CT HEAD W/O CM
4 series · 16 of 47 positions shown, 18 images · non-contrast
Comparison: CT head 04/22/2019

CLINICAL DATA: Cerebral hemorrhage suspected. Witnessed cardiac
arrest.

EXAM:
CT HEAD WITHOUT CONTRAST
TECHNIQUE: Contiguous axial images were obtained from the base of the skull
through the vertex without intravenous contrast.

[Series 3: head without · axial · non-contrast · 0.48mm/px · z∈[-260,-140]mm · 7 of 34 slices shown, 9 images]
[im 5/34  brain]
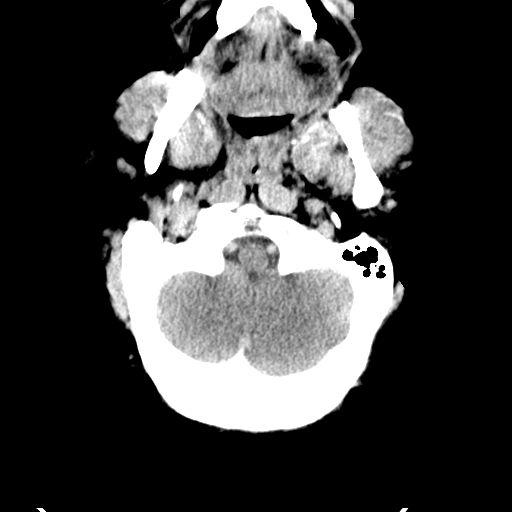
[im 5/34  bone]
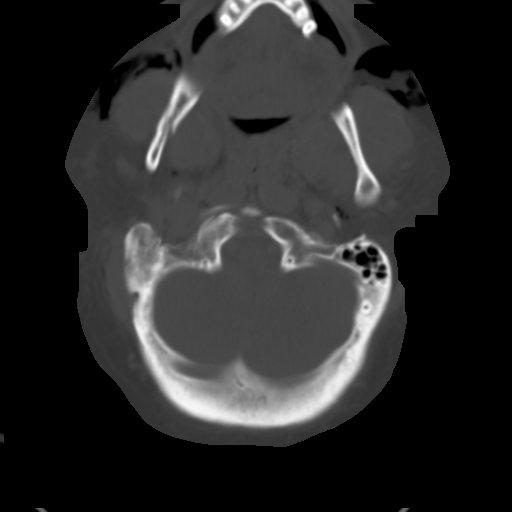
[im 9/34  brain]
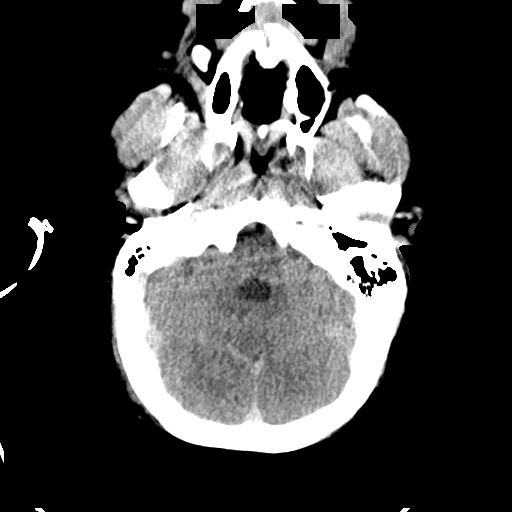
[im 13/34  brain]
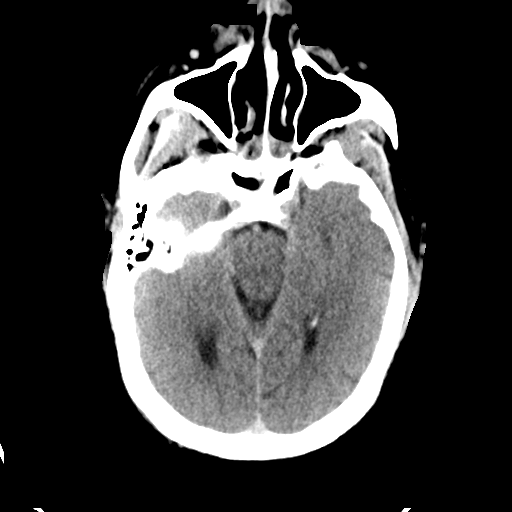
[im 17/34  brain]
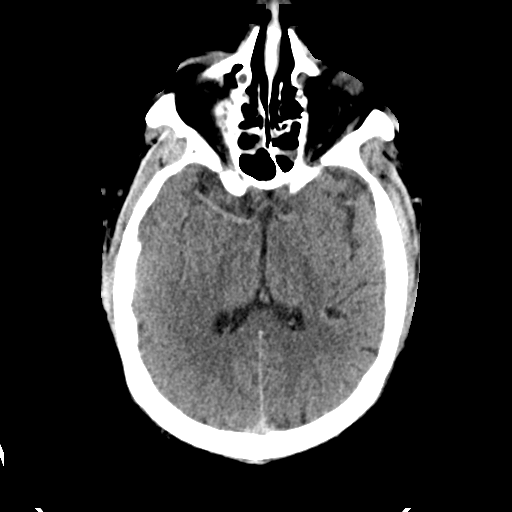
[im 21/34  brain]
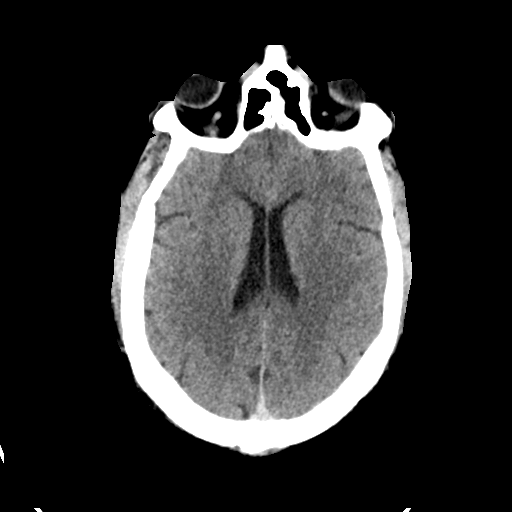
[im 21/34  bone]
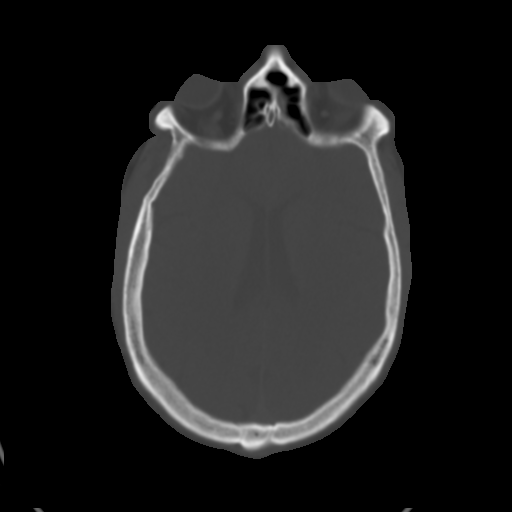
[im 25/34  brain]
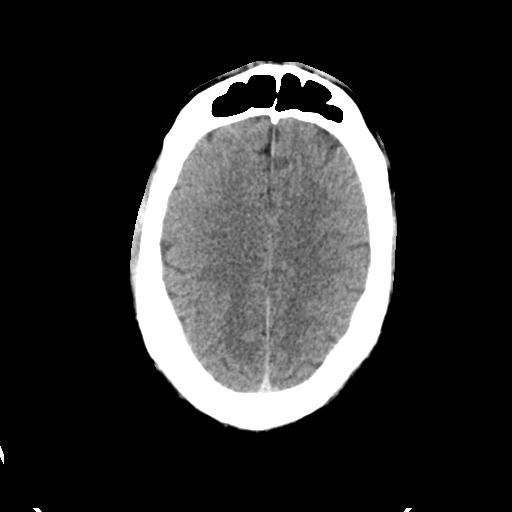
[im 29/34  brain]
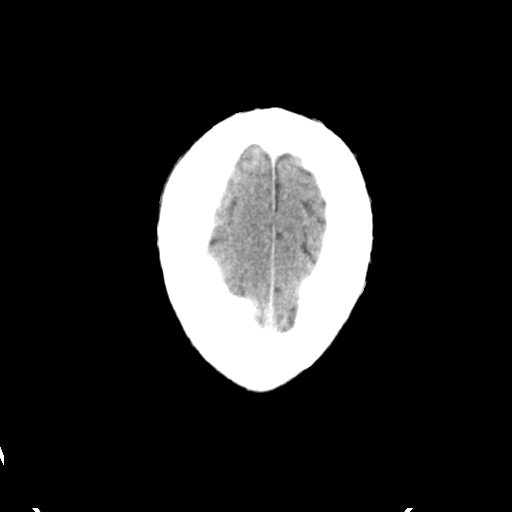

[Series 4: head bone · axial · 0.48mm/px · z∈[-264,-232]mm · 3 of 84 slices shown]
[im 9/84  bone]
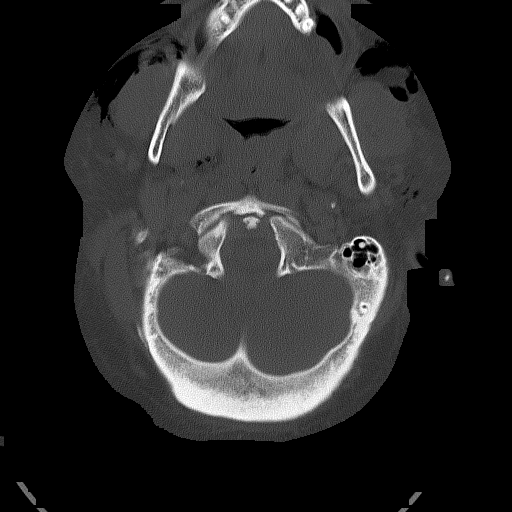
[im 17/84  bone]
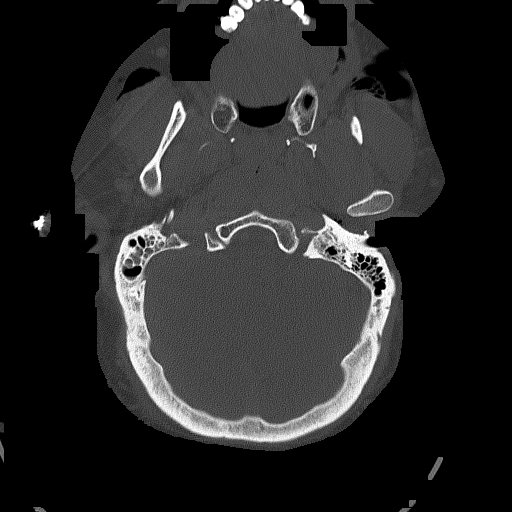
[im 25/84  bone]
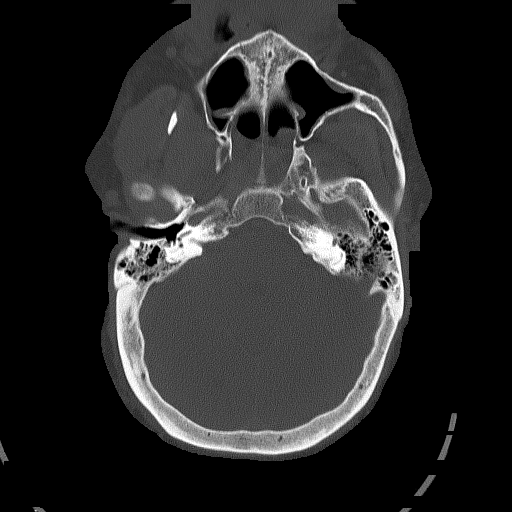

[Series 5: head without cor · coronal · non-contrast · 0.37mm/px · 3 of 84 slices shown]
[im 28/84  brain]
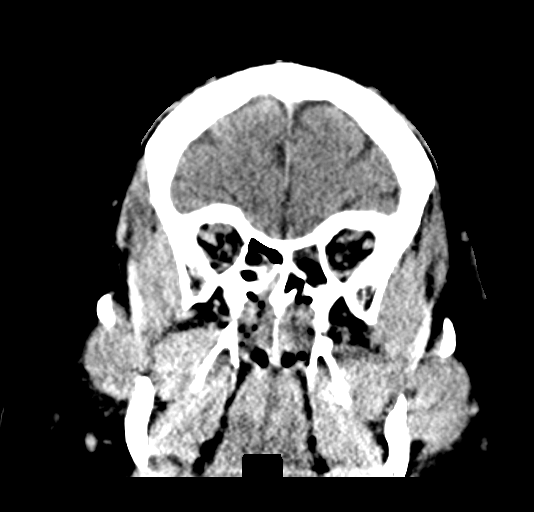
[im 37/84  brain]
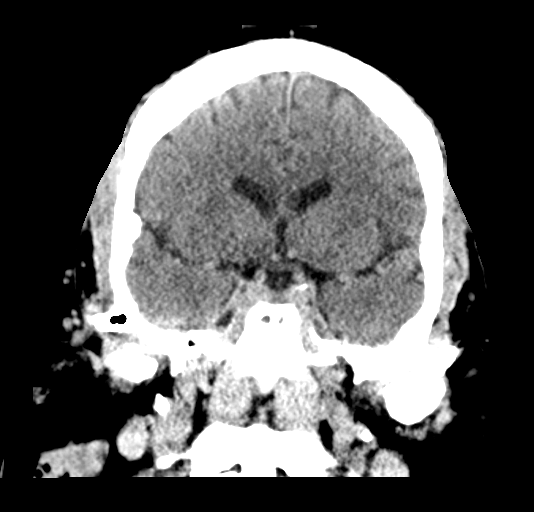
[im 47/84  brain]
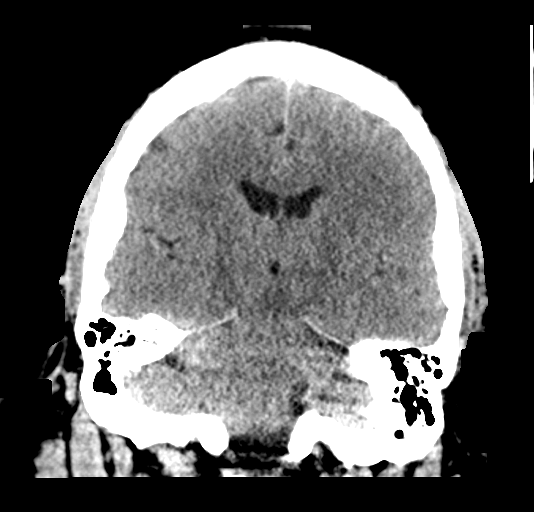

[Series 6: head without sag · sagittal · non-contrast · 0.37mm/px · 3 of 67 slices shown]
[im 23/67  brain]
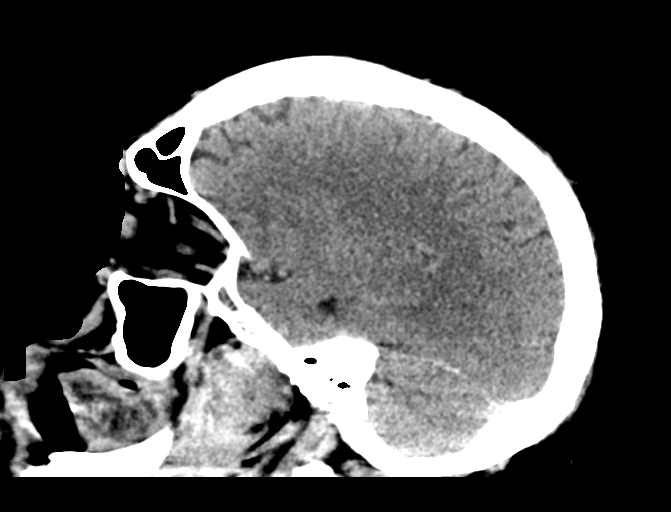
[im 34/67  brain]
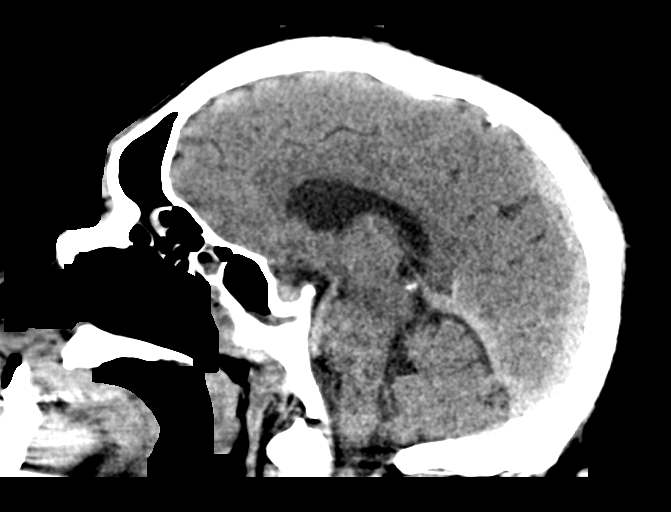
[im 45/67  brain]
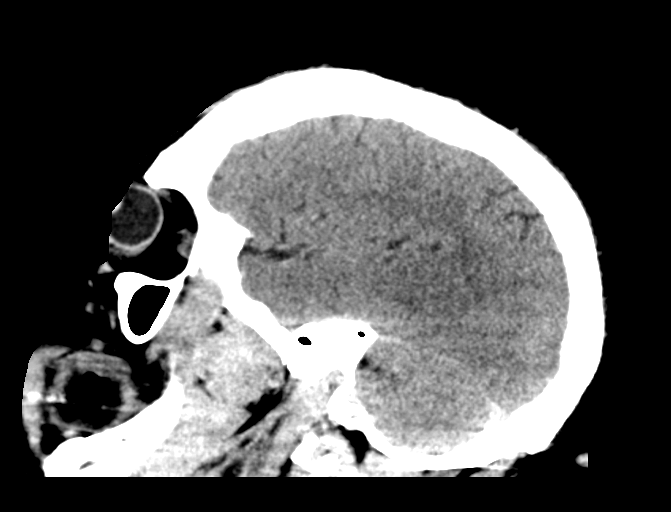

[16 of 47 positions shown; findings below may reference images not displayed]

FINDINGS: Brain:

No evidence of large-territorial acute infarction. No parenchymal
hemorrhage. No mass lesion. No extra-axial collection.

No mass effect or midline shift. No hydrocephalus. Basilar cisterns
are patent.

Vascular: No hyperdense vessel.

Skull: No acute fracture or focal lesion.

Sinuses/Orbits: Bilateral maxillary, ethmoid, sphenoid sinus mucosal
thickening. Otherwise the paranasal sinuses and mastoid air cells
are clear. The orbits are unremarkable.

Other: Subcutaneus soft tissue edema tracking along the face.
IMPRESSION: No acute intracranial abnormality.
